# Patient Record
Sex: Female | Born: 1965 | Race: White | Hispanic: No | Marital: Married | Smoking: Never smoker
Health system: Southern US, Community
[De-identification: ages and names within clinical notes are randomized; demographics above are authoritative.]

## PROBLEM LIST (undated history)

## (undated) DIAGNOSIS — F4322 Adjustment disorder with anxiety: Secondary | ICD-10-CM

## (undated) DIAGNOSIS — R0602 Shortness of breath: Secondary | ICD-10-CM

## (undated) DIAGNOSIS — E559 Vitamin D deficiency, unspecified: Secondary | ICD-10-CM

## (undated) DIAGNOSIS — K219 Gastro-esophageal reflux disease without esophagitis: Secondary | ICD-10-CM

## (undated) DIAGNOSIS — G4700 Insomnia, unspecified: Secondary | ICD-10-CM

## (undated) DIAGNOSIS — Z87442 Personal history of urinary calculi: Secondary | ICD-10-CM

## (undated) DIAGNOSIS — IMO0002 Reserved for concepts with insufficient information to code with codable children: Secondary | ICD-10-CM

## (undated) DIAGNOSIS — E785 Hyperlipidemia, unspecified: Secondary | ICD-10-CM

## (undated) DIAGNOSIS — M2550 Pain in unspecified joint: Secondary | ICD-10-CM

## (undated) DIAGNOSIS — R600 Localized edema: Secondary | ICD-10-CM

## (undated) DIAGNOSIS — R7303 Prediabetes: Secondary | ICD-10-CM

## (undated) DIAGNOSIS — F419 Anxiety disorder, unspecified: Secondary | ICD-10-CM

## (undated) DIAGNOSIS — E538 Deficiency of other specified B group vitamins: Secondary | ICD-10-CM

## (undated) DIAGNOSIS — I7121 Aneurysm of the ascending aorta, without rupture: Secondary | ICD-10-CM

## (undated) DIAGNOSIS — K5900 Constipation, unspecified: Secondary | ICD-10-CM

## (undated) DIAGNOSIS — T505X5A Adverse effect of appetite depressants, initial encounter: Secondary | ICD-10-CM

## (undated) DIAGNOSIS — K635 Polyp of colon: Secondary | ICD-10-CM

## (undated) DIAGNOSIS — M549 Dorsalgia, unspecified: Secondary | ICD-10-CM

## (undated) DIAGNOSIS — R61 Generalized hyperhidrosis: Secondary | ICD-10-CM

## (undated) DIAGNOSIS — R32 Unspecified urinary incontinence: Secondary | ICD-10-CM

## (undated) HISTORY — PX: BREAST EXCISIONAL BIOPSY: SUR124

## (undated) HISTORY — DX: Adjustment disorder with anxiety: F43.22

## (undated) HISTORY — DX: Gastro-esophageal reflux disease without esophagitis: K21.9

## (undated) HISTORY — DX: Unspecified urinary incontinence: R32

## (undated) HISTORY — DX: Constipation, unspecified: K59.00

## (undated) HISTORY — DX: Pain in unspecified joint: M25.50

## (undated) HISTORY — DX: Polyp of colon: K63.5

## (undated) HISTORY — DX: Dorsalgia, unspecified: M54.9

## (undated) HISTORY — DX: Shortness of breath: R06.02

## (undated) HISTORY — DX: Anxiety disorder, unspecified: F41.9

## (undated) HISTORY — DX: Prediabetes: R73.03

## (undated) HISTORY — DX: Deficiency of other specified B group vitamins: E53.8

## (undated) HISTORY — DX: Localized edema: R60.0

## (undated) HISTORY — DX: Insomnia, unspecified: G47.00

## (undated) HISTORY — DX: Generalized hyperhidrosis: R61

## (undated) HISTORY — DX: Reserved for concepts with insufficient information to code with codable children: IMO0002

## (undated) HISTORY — PX: TONSILLECTOMY: SUR1361

## (undated) HISTORY — DX: Vitamin D deficiency, unspecified: E55.9

## (undated) HISTORY — DX: Hyperlipidemia, unspecified: E78.5

---
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
## 1991-11-20 HISTORY — PX: OTHER SURGICAL HISTORY: SHX169

## 1998-04-06 ENCOUNTER — Other Ambulatory Visit: Admission: RE | Admit: 1998-04-06 | Discharge: 1998-04-06 | Payer: Self-pay | Admitting: Dermatology

## 1998-10-24 ENCOUNTER — Ambulatory Visit (HOSPITAL_COMMUNITY): Admission: RE | Admit: 1998-10-24 | Discharge: 1998-10-24 | Payer: Self-pay | Admitting: *Deleted

## 2000-09-19 ENCOUNTER — Other Ambulatory Visit: Admission: RE | Admit: 2000-09-19 | Discharge: 2000-09-19 | Payer: Self-pay | Admitting: Obstetrics and Gynecology

## 2002-02-03 ENCOUNTER — Other Ambulatory Visit: Admission: RE | Admit: 2002-02-03 | Discharge: 2002-02-03 | Payer: Self-pay | Admitting: Obstetrics and Gynecology

## 2002-02-10 ENCOUNTER — Observation Stay (HOSPITAL_COMMUNITY): Admission: RE | Admit: 2002-02-10 | Discharge: 2002-02-11 | Payer: Self-pay | Admitting: Obstetrics and Gynecology

## 2002-02-10 HISTORY — PX: OTHER SURGICAL HISTORY: SHX169

## 2003-10-11 ENCOUNTER — Ambulatory Visit (HOSPITAL_COMMUNITY): Admission: RE | Admit: 2003-10-11 | Discharge: 2003-10-11 | Payer: Self-pay | Admitting: Cardiology

## 2004-04-05 ENCOUNTER — Other Ambulatory Visit: Admission: RE | Admit: 2004-04-05 | Discharge: 2004-04-05 | Payer: Self-pay | Admitting: Internal Medicine

## 2004-05-11 ENCOUNTER — Encounter: Admission: RE | Admit: 2004-05-11 | Discharge: 2004-05-11 | Payer: Self-pay | Admitting: Internal Medicine

## 2004-09-07 ENCOUNTER — Encounter: Admission: RE | Admit: 2004-09-07 | Discharge: 2004-09-07 | Payer: Self-pay | Admitting: Internal Medicine

## 2004-10-04 ENCOUNTER — Encounter: Admission: RE | Admit: 2004-10-04 | Discharge: 2004-10-05 | Payer: Self-pay | Admitting: Internal Medicine

## 2005-05-30 ENCOUNTER — Encounter: Admission: RE | Admit: 2005-05-30 | Discharge: 2005-05-30 | Payer: Self-pay | Admitting: Internal Medicine

## 2006-06-20 ENCOUNTER — Ambulatory Visit: Payer: Self-pay | Admitting: Family Medicine

## 2006-06-20 ENCOUNTER — Other Ambulatory Visit: Admission: RE | Admit: 2006-06-20 | Discharge: 2006-06-20 | Payer: Self-pay | Admitting: Family Medicine

## 2006-06-21 ENCOUNTER — Ambulatory Visit: Payer: Self-pay | Admitting: Family Medicine

## 2006-06-27 ENCOUNTER — Ambulatory Visit: Payer: Self-pay | Admitting: Family Medicine

## 2006-07-30 ENCOUNTER — Ambulatory Visit: Payer: Self-pay | Admitting: Family Medicine

## 2006-10-01 ENCOUNTER — Ambulatory Visit: Payer: Self-pay | Admitting: Internal Medicine

## 2006-10-01 LAB — CONVERTED CEMR LAB
Sed Rate: 40 mm/hr — ABNORMAL HIGH (ref 0–25)
Uric Acid, Serum: 4.6 mg/dL (ref 2.4–7.0)

## 2006-10-02 ENCOUNTER — Encounter: Admission: RE | Admit: 2006-10-02 | Discharge: 2006-10-02 | Payer: Self-pay | Admitting: Internal Medicine

## 2006-10-17 ENCOUNTER — Ambulatory Visit: Payer: Self-pay | Admitting: Internal Medicine

## 2006-10-22 ENCOUNTER — Ambulatory Visit: Payer: Self-pay | Admitting: Internal Medicine

## 2006-10-22 LAB — CONVERTED CEMR LAB
Free T4: 1 ng/dL (ref 0.9–1.8)
TSH: 2 microintl units/mL (ref 0.35–5.50)

## 2006-11-01 ENCOUNTER — Ambulatory Visit: Payer: Self-pay | Admitting: Internal Medicine

## 2006-12-27 ENCOUNTER — Ambulatory Visit: Payer: Self-pay | Admitting: Family Medicine

## 2007-01-08 ENCOUNTER — Ambulatory Visit: Payer: Self-pay | Admitting: Family Medicine

## 2007-01-08 LAB — CONVERTED CEMR LAB
Basophils Absolute: 0 10*3/uL (ref 0.0–0.1)
Basophils Relative: 0.8 % (ref 0.0–1.0)
Eosinophils Absolute: 0.1 10*3/uL (ref 0.0–0.6)
Eosinophils Relative: 4.5 % (ref 0.0–5.0)
HCT: 37.6 % (ref 36.0–46.0)
Hemoglobin: 13 g/dL (ref 12.0–15.0)
Lymphocytes Relative: 17.6 % (ref 12.0–46.0)
MCHC: 34.4 g/dL (ref 30.0–36.0)
MCV: 92.8 fL (ref 78.0–100.0)
Monocytes Absolute: 0.5 10*3/uL (ref 0.2–0.7)
Monocytes Relative: 15.2 % — ABNORMAL HIGH (ref 3.0–11.0)
Neutro Abs: 2 10*3/uL (ref 1.4–7.7)
Neutrophils Relative %: 61.9 % (ref 43.0–77.0)
Platelets: 130 10*3/uL — ABNORMAL LOW (ref 150–400)
RBC: 4.06 M/uL (ref 3.87–5.11)
RDW: 12.2 % (ref 11.5–14.6)
WBC: 3.1 10*3/uL — ABNORMAL LOW (ref 4.5–10.5)

## 2007-01-22 ENCOUNTER — Ambulatory Visit: Payer: Self-pay | Admitting: Internal Medicine

## 2007-01-22 LAB — CONVERTED CEMR LAB
ALT: 23 units/L (ref 0–40)
AST: 20 units/L (ref 0–37)
Albumin: 3.2 g/dL — ABNORMAL LOW (ref 3.5–5.2)
Alkaline Phosphatase: 58 units/L (ref 39–117)
Basophils Absolute: 0 10*3/uL (ref 0.0–0.1)
Basophils Relative: 0.1 % (ref 0.0–1.0)
Bilirubin, Direct: 0.1 mg/dL (ref 0.0–0.3)
CO2: 29 meq/L (ref 19–32)
Chloride: 107 meq/L (ref 96–112)
Cholesterol: 198 mg/dL (ref 0–200)
Eosinophils Absolute: 0.2 10*3/uL (ref 0.0–0.6)
Eosinophils Relative: 4.3 % (ref 0.0–5.0)
Free T4: 0.8 ng/dL (ref 0.6–1.6)
HCT: 38.4 % (ref 36.0–46.0)
HDL: 45.4 mg/dL (ref >39.0)
Hemoglobin: 13.2 g/dL (ref 12.0–15.0)
LDL Cholesterol: 134 mg/dL — ABNORMAL HIGH (ref 0–99)
Lymphocytes Relative: 35.7 % (ref 12.0–46.0)
MCHC: 34.3 g/dL (ref 30.0–36.0)
MCV: 91.4 fL (ref 78.0–100.0)
Monocytes Absolute: 0.4 10*3/uL (ref 0.2–0.7)
Monocytes Relative: 8.8 % (ref 3.0–11.0)
Neutro Abs: 2.5 10*3/uL (ref 1.4–7.7)
Neutrophils Relative %: 51.1 % (ref 43.0–77.0)
Platelets: 162 10*3/uL (ref 150–400)
Potassium: 4.4 meq/L (ref 3.5–5.1)
RBC: 4.2 M/uL (ref 3.87–5.11)
RDW: 12 % (ref 11.5–14.6)
Sodium: 141 meq/L (ref 135–145)
TSH: 2.5 microintl units/mL (ref 0.35–5.50)
Total Bilirubin: 0.7 mg/dL (ref 0.3–1.2)
Total CHOL/HDL Ratio: 4.4
Total Protein: 6.6 g/dL (ref 6.0–8.3)
Triglycerides: 92 mg/dL (ref 0–149)
VLDL: 18 mg/dL (ref 0–40)
WBC: 4.8 10*3/uL (ref 4.5–10.5)

## 2007-05-20 ENCOUNTER — Telehealth (INDEPENDENT_AMBULATORY_CARE_PROVIDER_SITE_OTHER): Payer: Self-pay | Admitting: *Deleted

## 2007-06-06 ENCOUNTER — Ambulatory Visit: Payer: Self-pay | Admitting: Family Medicine

## 2007-06-06 DIAGNOSIS — E669 Obesity, unspecified: Secondary | ICD-10-CM | POA: Insufficient documentation

## 2007-07-10 ENCOUNTER — Ambulatory Visit: Payer: Self-pay | Admitting: Family Medicine

## 2007-07-11 ENCOUNTER — Telehealth (INDEPENDENT_AMBULATORY_CARE_PROVIDER_SITE_OTHER): Payer: Self-pay | Admitting: *Deleted

## 2007-08-05 ENCOUNTER — Telehealth (INDEPENDENT_AMBULATORY_CARE_PROVIDER_SITE_OTHER): Payer: Self-pay | Admitting: *Deleted

## 2007-09-17 ENCOUNTER — Telehealth: Payer: Self-pay | Admitting: Internal Medicine

## 2007-09-23 ENCOUNTER — Encounter: Admission: RE | Admit: 2007-09-23 | Discharge: 2007-09-23 | Payer: Self-pay | Admitting: Family Medicine

## 2007-12-02 ENCOUNTER — Ambulatory Visit: Payer: Self-pay | Admitting: Internal Medicine

## 2007-12-02 DIAGNOSIS — IMO0001 Reserved for inherently not codable concepts without codable children: Secondary | ICD-10-CM | POA: Insufficient documentation

## 2007-12-06 LAB — CONVERTED CEMR LAB
BUN: 12 mg/dL (ref 6–23)
Creatinine, Ser: 0.7 mg/dL (ref 0.4–1.2)
Potassium: 4.6 meq/L (ref 3.5–5.1)
Total CK: 49 units/L (ref 7–177)

## 2007-12-08 ENCOUNTER — Encounter (INDEPENDENT_AMBULATORY_CARE_PROVIDER_SITE_OTHER): Payer: Self-pay | Admitting: *Deleted

## 2007-12-18 ENCOUNTER — Telehealth (INDEPENDENT_AMBULATORY_CARE_PROVIDER_SITE_OTHER): Payer: Self-pay | Admitting: *Deleted

## 2008-02-19 ENCOUNTER — Telehealth (INDEPENDENT_AMBULATORY_CARE_PROVIDER_SITE_OTHER): Payer: Self-pay | Admitting: *Deleted

## 2008-05-25 ENCOUNTER — Ambulatory Visit: Payer: Self-pay | Admitting: Internal Medicine

## 2008-05-25 DIAGNOSIS — M940 Chondrocostal junction syndrome [Tietze]: Secondary | ICD-10-CM | POA: Insufficient documentation

## 2008-06-01 ENCOUNTER — Other Ambulatory Visit: Admission: RE | Admit: 2008-06-01 | Discharge: 2008-06-01 | Payer: Self-pay | Admitting: Family Medicine

## 2008-06-01 ENCOUNTER — Ambulatory Visit: Payer: Self-pay | Admitting: Family Medicine

## 2008-06-01 ENCOUNTER — Encounter: Payer: Self-pay | Admitting: Family Medicine

## 2008-06-03 ENCOUNTER — Encounter (INDEPENDENT_AMBULATORY_CARE_PROVIDER_SITE_OTHER): Payer: Self-pay | Admitting: *Deleted

## 2008-06-14 ENCOUNTER — Encounter (INDEPENDENT_AMBULATORY_CARE_PROVIDER_SITE_OTHER): Payer: Self-pay | Admitting: *Deleted

## 2008-07-28 ENCOUNTER — Ambulatory Visit: Payer: Self-pay | Admitting: Family Medicine

## 2008-07-28 DIAGNOSIS — R079 Chest pain, unspecified: Secondary | ICD-10-CM | POA: Insufficient documentation

## 2008-08-11 ENCOUNTER — Ambulatory Visit: Payer: Self-pay

## 2008-08-11 ENCOUNTER — Encounter: Payer: Self-pay | Admitting: Family Medicine

## 2008-08-31 ENCOUNTER — Telehealth (INDEPENDENT_AMBULATORY_CARE_PROVIDER_SITE_OTHER): Payer: Self-pay | Admitting: *Deleted

## 2008-09-02 ENCOUNTER — Telehealth (INDEPENDENT_AMBULATORY_CARE_PROVIDER_SITE_OTHER): Payer: Self-pay | Admitting: *Deleted

## 2008-09-13 ENCOUNTER — Telehealth (INDEPENDENT_AMBULATORY_CARE_PROVIDER_SITE_OTHER): Payer: Self-pay | Admitting: *Deleted

## 2008-09-14 ENCOUNTER — Encounter (INDEPENDENT_AMBULATORY_CARE_PROVIDER_SITE_OTHER): Payer: Self-pay | Admitting: *Deleted

## 2008-12-28 ENCOUNTER — Ambulatory Visit: Payer: Self-pay | Admitting: Family Medicine

## 2008-12-29 ENCOUNTER — Telehealth (INDEPENDENT_AMBULATORY_CARE_PROVIDER_SITE_OTHER): Payer: Self-pay | Admitting: *Deleted

## 2008-12-30 ENCOUNTER — Encounter (INDEPENDENT_AMBULATORY_CARE_PROVIDER_SITE_OTHER): Payer: Self-pay | Admitting: *Deleted

## 2009-02-10 ENCOUNTER — Ambulatory Visit: Payer: Self-pay | Admitting: Internal Medicine

## 2009-02-10 DIAGNOSIS — K219 Gastro-esophageal reflux disease without esophagitis: Secondary | ICD-10-CM | POA: Insufficient documentation

## 2009-02-10 LAB — CONVERTED CEMR LAB
Bilirubin Urine: NEGATIVE
Blood in Urine, dipstick: NEGATIVE
Glucose, Urine, Semiquant: NEGATIVE
Ketones, urine, test strip: NEGATIVE
Nitrite: POSITIVE
Protein, U semiquant: NEGATIVE
Specific Gravity, Urine: 1.01
Urobilinogen, UA: 0.2
WBC Urine, dipstick: NEGATIVE
pH: 6

## 2009-02-14 ENCOUNTER — Encounter (INDEPENDENT_AMBULATORY_CARE_PROVIDER_SITE_OTHER): Payer: Self-pay | Admitting: *Deleted

## 2009-03-17 ENCOUNTER — Ambulatory Visit: Payer: Self-pay | Admitting: Family Medicine

## 2009-03-17 LAB — CONVERTED CEMR LAB
ALT: 21 units/L (ref 0–35)
AST: 19 units/L (ref 0–37)
Albumin: 3.7 g/dL (ref 3.5–5.2)
Alkaline Phosphatase: 61 units/L (ref 39–117)
Bilirubin, Direct: 0 mg/dL (ref 0.0–0.3)
Cholesterol: 202 mg/dL — ABNORMAL HIGH (ref 0–200)
Direct LDL: 129.7 mg/dL
HDL: 38.3 mg/dL — ABNORMAL LOW (ref >39.00)
Total Bilirubin: 0.7 mg/dL (ref 0.3–1.2)
Total CHOL/HDL Ratio: 5
Total Protein: 6.9 g/dL (ref 6.0–8.3)
Triglycerides: 134 mg/dL (ref 0.0–149.0)
VLDL: 26.8 mg/dL (ref 0.0–40.0)

## 2009-03-18 ENCOUNTER — Encounter (INDEPENDENT_AMBULATORY_CARE_PROVIDER_SITE_OTHER): Payer: Self-pay | Admitting: *Deleted

## 2009-04-06 ENCOUNTER — Ambulatory Visit: Payer: Self-pay | Admitting: Family Medicine

## 2009-04-06 DIAGNOSIS — R252 Cramp and spasm: Secondary | ICD-10-CM | POA: Insufficient documentation

## 2009-04-06 LAB — CONVERTED CEMR LAB
BUN: 15 mg/dL (ref 6–23)
CO2: 29 meq/L (ref 19–32)
Calcium: 9.2 mg/dL (ref 8.4–10.5)
Chloride: 110 meq/L (ref 96–112)
Creatinine, Ser: 0.6 mg/dL (ref 0.4–1.2)
GFR calc non Af Amer: 114.98 mL/min (ref >60)
Glucose, Bld: 69 mg/dL — ABNORMAL LOW (ref 70–99)
Magnesium: 2.2 mg/dL (ref 1.5–2.5)
Potassium: 4 meq/L (ref 3.5–5.1)
Sodium: 142 meq/L (ref 135–145)
TSH: 2.9 microintl units/mL (ref 0.35–5.50)

## 2009-04-07 ENCOUNTER — Encounter (INDEPENDENT_AMBULATORY_CARE_PROVIDER_SITE_OTHER): Payer: Self-pay | Admitting: *Deleted

## 2009-04-07 ENCOUNTER — Encounter: Payer: Self-pay | Admitting: Family Medicine

## 2009-04-19 ENCOUNTER — Encounter (INDEPENDENT_AMBULATORY_CARE_PROVIDER_SITE_OTHER): Payer: Self-pay | Admitting: *Deleted

## 2009-04-19 LAB — CONVERTED CEMR LAB: Vit D, 25-Hydroxy: 25 ng/mL — ABNORMAL LOW (ref 30–89)

## 2009-04-21 ENCOUNTER — Telehealth (INDEPENDENT_AMBULATORY_CARE_PROVIDER_SITE_OTHER): Payer: Self-pay | Admitting: *Deleted

## 2009-05-03 ENCOUNTER — Ambulatory Visit (HOSPITAL_COMMUNITY): Admission: RE | Admit: 2009-05-03 | Discharge: 2009-05-04 | Payer: Self-pay | Admitting: Obstetrics and Gynecology

## 2009-05-03 HISTORY — PX: ANTERIOR AND POSTERIOR REPAIR: SHX1172

## 2009-05-17 ENCOUNTER — Encounter: Payer: Self-pay | Admitting: Internal Medicine

## 2009-06-15 ENCOUNTER — Telehealth (INDEPENDENT_AMBULATORY_CARE_PROVIDER_SITE_OTHER): Payer: Self-pay | Admitting: *Deleted

## 2009-06-21 ENCOUNTER — Telehealth (INDEPENDENT_AMBULATORY_CARE_PROVIDER_SITE_OTHER): Payer: Self-pay | Admitting: *Deleted

## 2009-06-23 ENCOUNTER — Ambulatory Visit: Payer: Self-pay | Admitting: Family Medicine

## 2009-06-23 ENCOUNTER — Ambulatory Visit (HOSPITAL_BASED_OUTPATIENT_CLINIC_OR_DEPARTMENT_OTHER): Admission: RE | Admit: 2009-06-23 | Discharge: 2009-06-23 | Payer: Self-pay | Admitting: Family Medicine

## 2009-06-23 ENCOUNTER — Ambulatory Visit: Payer: Self-pay | Admitting: Interventional Radiology

## 2009-06-23 DIAGNOSIS — M25519 Pain in unspecified shoulder: Secondary | ICD-10-CM | POA: Insufficient documentation

## 2009-06-23 DIAGNOSIS — T7411XA Adult physical abuse, confirmed, initial encounter: Secondary | ICD-10-CM | POA: Insufficient documentation

## 2009-07-19 ENCOUNTER — Telehealth: Payer: Self-pay | Admitting: Internal Medicine

## 2009-07-22 ENCOUNTER — Telehealth: Payer: Self-pay | Admitting: Internal Medicine

## 2009-08-19 ENCOUNTER — Ambulatory Visit: Payer: Self-pay | Admitting: Family Medicine

## 2009-08-25 ENCOUNTER — Telehealth: Payer: Self-pay | Admitting: Family Medicine

## 2009-08-25 LAB — CONVERTED CEMR LAB: Vit D, 25-Hydroxy: 27 ng/mL — ABNORMAL LOW (ref 30–89)

## 2009-08-29 ENCOUNTER — Encounter: Payer: Self-pay | Admitting: Family Medicine

## 2009-08-31 ENCOUNTER — Telehealth (INDEPENDENT_AMBULATORY_CARE_PROVIDER_SITE_OTHER): Payer: Self-pay | Admitting: *Deleted

## 2009-09-12 ENCOUNTER — Ambulatory Visit: Payer: Self-pay | Admitting: Family Medicine

## 2009-10-17 ENCOUNTER — Telehealth: Payer: Self-pay | Admitting: Family Medicine

## 2009-10-26 ENCOUNTER — Telehealth: Payer: Self-pay | Admitting: Internal Medicine

## 2009-11-10 ENCOUNTER — Telehealth: Payer: Self-pay | Admitting: Internal Medicine

## 2009-11-15 ENCOUNTER — Ambulatory Visit: Payer: Self-pay | Admitting: Internal Medicine

## 2009-11-15 ENCOUNTER — Encounter: Payer: Self-pay | Admitting: Internal Medicine

## 2009-11-15 LAB — CONVERTED CEMR LAB
Bilirubin Urine: NEGATIVE
Glucose, Urine, Semiquant: NEGATIVE
Ketones, urine, test strip: NEGATIVE
Nitrite: NEGATIVE
Protein, U semiquant: NEGATIVE
Specific Gravity, Urine: 1.02
Urobilinogen, UA: 0.2
pH: 6.5

## 2009-11-16 LAB — CONVERTED CEMR LAB
Casts: NONE SEEN /lpf
Crystals: NONE SEEN
Squamous Epithelial / LPF: NONE SEEN /lpf

## 2009-11-21 ENCOUNTER — Encounter (INDEPENDENT_AMBULATORY_CARE_PROVIDER_SITE_OTHER): Payer: Self-pay | Admitting: *Deleted

## 2009-11-30 ENCOUNTER — Ambulatory Visit: Payer: Self-pay | Admitting: Internal Medicine

## 2009-11-30 LAB — CONVERTED CEMR LAB
Bilirubin Urine: NEGATIVE
Blood in Urine, dipstick: NEGATIVE
Glucose, Urine, Semiquant: NEGATIVE
Ketones, urine, test strip: NEGATIVE
Nitrite: NEGATIVE
Protein, U semiquant: NEGATIVE
Specific Gravity, Urine: 1.015
Urobilinogen, UA: 0.2
pH: 5

## 2009-12-01 ENCOUNTER — Encounter: Payer: Self-pay | Admitting: Internal Medicine

## 2009-12-01 LAB — CONVERTED CEMR LAB
Casts: NONE SEEN /lpf
Crystals: NONE SEEN
RBC / HPF: NONE SEEN (ref <3)

## 2009-12-08 ENCOUNTER — Ambulatory Visit (HOSPITAL_COMMUNITY): Admission: RE | Admit: 2009-12-08 | Discharge: 2009-12-08 | Payer: Self-pay | Admitting: Internal Medicine

## 2010-01-18 ENCOUNTER — Encounter: Admission: RE | Admit: 2010-01-18 | Discharge: 2010-01-18 | Payer: Self-pay | Admitting: Obstetrics and Gynecology

## 2010-02-27 ENCOUNTER — Telehealth (INDEPENDENT_AMBULATORY_CARE_PROVIDER_SITE_OTHER): Payer: Self-pay | Admitting: *Deleted

## 2010-03-25 ENCOUNTER — Telehealth: Payer: Self-pay | Admitting: Family Medicine

## 2010-03-27 ENCOUNTER — Telehealth: Payer: Self-pay | Admitting: Family Medicine

## 2010-03-31 ENCOUNTER — Encounter (INDEPENDENT_AMBULATORY_CARE_PROVIDER_SITE_OTHER): Payer: Self-pay | Admitting: *Deleted

## 2010-04-11 ENCOUNTER — Ambulatory Visit: Payer: Self-pay | Admitting: Family Medicine

## 2010-04-11 DIAGNOSIS — N390 Urinary tract infection, site not specified: Secondary | ICD-10-CM | POA: Insufficient documentation

## 2010-04-12 ENCOUNTER — Encounter: Payer: Self-pay | Admitting: Family Medicine

## 2010-04-13 ENCOUNTER — Encounter (INDEPENDENT_AMBULATORY_CARE_PROVIDER_SITE_OTHER): Payer: Self-pay | Admitting: *Deleted

## 2010-04-18 ENCOUNTER — Ambulatory Visit: Payer: Self-pay | Admitting: Family Medicine

## 2010-04-18 DIAGNOSIS — E538 Deficiency of other specified B group vitamins: Secondary | ICD-10-CM | POA: Insufficient documentation

## 2010-04-25 ENCOUNTER — Ambulatory Visit: Payer: Self-pay | Admitting: Family Medicine

## 2010-05-02 ENCOUNTER — Ambulatory Visit: Payer: Self-pay | Admitting: Family Medicine

## 2010-05-09 ENCOUNTER — Ambulatory Visit: Payer: Self-pay | Admitting: Family Medicine

## 2010-05-19 ENCOUNTER — Telehealth: Payer: Self-pay | Admitting: Internal Medicine

## 2010-08-01 ENCOUNTER — Ambulatory Visit: Payer: Self-pay | Admitting: Cardiovascular Disease

## 2010-08-01 ENCOUNTER — Ambulatory Visit: Payer: Self-pay | Admitting: Internal Medicine

## 2010-08-01 LAB — CONVERTED CEMR LAB
ALT: 14 units/L (ref 0–35)
AST: 16 units/L (ref 0–37)
Albumin: 3.6 g/dL (ref 3.5–5.2)
Alkaline Phosphatase: 59 units/L (ref 39–117)
Amylase: 31 units/L (ref 27–131)
Basophils Absolute: 0 10*3/uL (ref 0.0–0.1)
Basophils Relative: 0.2 % (ref 0.0–3.0)
Bilirubin Urine: NEGATIVE
Bilirubin, Direct: 0.1 mg/dL (ref 0.0–0.3)
Blood Glucose, Fingerstick: 93
Blood in Urine, dipstick: NEGATIVE
Eosinophils Absolute: 0.1 10*3/uL (ref 0.0–0.7)
Eosinophils Relative: 2.4 % (ref 0.0–5.0)
Glucose, Urine, Semiquant: NEGATIVE
HCT: 39.2 % (ref 36.0–46.0)
Hemoglobin: 13.4 g/dL (ref 12.0–15.0)
Ketones, urine, test strip: NEGATIVE
Lipase: 36 units/L (ref 11.0–59.0)
Lymphocytes Relative: 31.5 % (ref 12.0–46.0)
Lymphs Abs: 1.6 10*3/uL (ref 0.7–4.0)
MCHC: 34.1 g/dL (ref 30.0–36.0)
MCV: 92.8 fL (ref 78.0–100.0)
Monocytes Absolute: 0.4 10*3/uL (ref 0.1–1.0)
Monocytes Relative: 7.9 % (ref 3.0–12.0)
Neutro Abs: 3 10*3/uL (ref 1.4–7.7)
Neutrophils Relative %: 58 % (ref 43.0–77.0)
Nitrite: NEGATIVE
Platelets: 166 10*3/uL (ref 150.0–400.0)
Protein, U semiquant: NEGATIVE
RBC: 4.22 M/uL (ref 3.87–5.11)
RDW: 12.6 % (ref 11.5–14.6)
Specific Gravity, Urine: 1.01
Total Bilirubin: 0.3 mg/dL (ref 0.3–1.2)
Total Protein: 6.4 g/dL (ref 6.0–8.3)
Urobilinogen, UA: NEGATIVE
WBC Urine, dipstick: NEGATIVE
WBC: 5.1 10*3/uL (ref 4.5–10.5)
pH: 6

## 2010-08-02 ENCOUNTER — Encounter: Payer: Self-pay | Admitting: Internal Medicine

## 2010-08-02 LAB — CONVERTED CEMR LAB
Casts: NONE SEEN /lpf
Crystals: NONE SEEN

## 2010-08-03 ENCOUNTER — Encounter: Payer: Self-pay | Admitting: Family Medicine

## 2010-08-17 ENCOUNTER — Encounter: Payer: Self-pay | Admitting: Internal Medicine

## 2010-08-22 ENCOUNTER — Telehealth (INDEPENDENT_AMBULATORY_CARE_PROVIDER_SITE_OTHER): Payer: Self-pay | Admitting: *Deleted

## 2010-08-23 ENCOUNTER — Ambulatory Visit: Payer: Self-pay | Admitting: Internal Medicine

## 2010-08-23 DIAGNOSIS — R51 Headache: Secondary | ICD-10-CM | POA: Insufficient documentation

## 2010-08-23 DIAGNOSIS — R519 Headache, unspecified: Secondary | ICD-10-CM | POA: Insufficient documentation

## 2010-08-28 ENCOUNTER — Telehealth: Payer: Self-pay | Admitting: Internal Medicine

## 2010-08-29 ENCOUNTER — Ambulatory Visit: Payer: Self-pay | Admitting: Internal Medicine

## 2010-08-29 ENCOUNTER — Telehealth: Payer: Self-pay | Admitting: Internal Medicine

## 2010-09-06 ENCOUNTER — Telehealth: Payer: Self-pay | Admitting: Internal Medicine

## 2010-09-14 ENCOUNTER — Ambulatory Visit: Payer: Self-pay | Admitting: Internal Medicine

## 2010-09-14 DIAGNOSIS — R5381 Other malaise: Secondary | ICD-10-CM | POA: Insufficient documentation

## 2010-09-14 DIAGNOSIS — R5383 Other fatigue: Secondary | ICD-10-CM

## 2010-09-14 LAB — CONVERTED CEMR LAB
Casts: NONE SEEN /lpf
Crystals: NONE SEEN
Folate: 9.3 ng/mL
Squamous Epithelial / LPF: NONE SEEN /lpf
Vitamin B-12: 311 pg/mL (ref 211–911)

## 2010-09-15 ENCOUNTER — Encounter: Payer: Self-pay | Admitting: Internal Medicine

## 2010-09-20 ENCOUNTER — Telehealth: Payer: Self-pay | Admitting: Internal Medicine

## 2010-09-21 ENCOUNTER — Telehealth (INDEPENDENT_AMBULATORY_CARE_PROVIDER_SITE_OTHER): Payer: Self-pay | Admitting: *Deleted

## 2010-10-23 ENCOUNTER — Telehealth (INDEPENDENT_AMBULATORY_CARE_PROVIDER_SITE_OTHER): Payer: Self-pay | Admitting: *Deleted

## 2010-10-26 ENCOUNTER — Ambulatory Visit
Admission: RE | Admit: 2010-10-26 | Discharge: 2010-10-26 | Payer: Self-pay | Source: Home / Self Care | Attending: Urology | Admitting: Urology

## 2010-10-26 HISTORY — PX: OTHER SURGICAL HISTORY: SHX169

## 2010-11-30 ENCOUNTER — Telehealth: Payer: Self-pay | Admitting: Family Medicine

## 2010-12-09 ENCOUNTER — Encounter: Payer: Self-pay | Admitting: Internal Medicine

## 2010-12-10 ENCOUNTER — Encounter: Payer: Self-pay | Admitting: Internal Medicine

## 2010-12-10 ENCOUNTER — Encounter: Payer: Self-pay | Admitting: Obstetrics and Gynecology

## 2010-12-10 ENCOUNTER — Encounter: Payer: Self-pay | Admitting: Family Medicine

## 2010-12-17 LAB — CONVERTED CEMR LAB
ALT: 16 units/L (ref 0–35)
ALT: 19 units/L (ref 0–35)
AST: 15 units/L (ref 0–37)
AST: 18 units/L (ref 0–37)
Albumin: 3.5 g/dL (ref 3.5–5.2)
Albumin: 3.7 g/dL (ref 3.5–5.2)
Alkaline Phosphatase: 55 units/L (ref 39–117)
Alkaline Phosphatase: 56 units/L (ref 39–117)
BUN: 10 mg/dL (ref 6–23)
BUN: 12 mg/dL (ref 6–23)
Basophils Absolute: 0 10*3/uL (ref 0.0–0.1)
Basophils Absolute: 0 10*3/uL (ref 0.0–0.1)
Basophils Relative: 0.2 % (ref 0.0–1.0)
Basophils Relative: 0.5 % (ref 0.0–3.0)
Bilirubin, Direct: 0.1 mg/dL (ref 0.0–0.3)
Bilirubin, Direct: 0.1 mg/dL (ref 0.0–0.3)
Blood in Urine, dipstick: NEGATIVE
CO2: 27 meq/L (ref 19–32)
CO2: 28 meq/L (ref 19–32)
Calcium: 9 mg/dL (ref 8.4–10.5)
Calcium: 9.1 mg/dL (ref 8.4–10.5)
Chloride: 105 meq/L (ref 96–112)
Chloride: 106 meq/L (ref 96–112)
Cholesterol: 195 mg/dL (ref 0–200)
Cholesterol: 203 mg/dL (ref 0–200)
Creatinine, Ser: 0.5 mg/dL (ref 0.4–1.2)
Creatinine, Ser: 0.5 mg/dL (ref 0.4–1.2)
Direct LDL: 135.3 mg/dL
Eosinophils Absolute: 0.1 10*3/uL (ref 0.0–0.7)
Eosinophils Absolute: 0.2 10*3/uL (ref 0.0–0.7)
Eosinophils Relative: 1.9 % (ref 0.0–5.0)
Eosinophils Relative: 4.6 % (ref 0.0–5.0)
Folate: 11.4 ng/mL
GFR calc Af Amer: 173 mL/min
GFR calc non Af Amer: 143 mL/min
GFR calc non Af Amer: 155.53 mL/min (ref >60)
Glucose, Bld: 84 mg/dL (ref 70–99)
Glucose, Bld: 88 mg/dL (ref 70–99)
Glucose, Urine, Semiquant: NEGATIVE
HCT: 37.7 % (ref 36.0–46.0)
HCT: 38.4 % (ref 36.0–46.0)
HDL: 36.3 mg/dL — ABNORMAL LOW (ref >39.0)
HDL: 43.5 mg/dL (ref >39.00)
Hemoglobin: 13 g/dL (ref 12.0–15.0)
Hemoglobin: 13.4 g/dL (ref 12.0–15.0)
Ketones, urine, test strip: NEGATIVE
LDL Cholesterol: 125 mg/dL — ABNORMAL HIGH (ref 0–99)
Lymphocytes Relative: 29.1 % (ref 12.0–46.0)
Lymphocytes Relative: 31 % (ref 12.0–46.0)
Lymphs Abs: 1.5 10*3/uL (ref 0.7–4.0)
MCHC: 34.5 g/dL (ref 30.0–36.0)
MCHC: 34.9 g/dL (ref 30.0–36.0)
MCV: 91.2 fL (ref 78.0–100.0)
MCV: 92.3 fL (ref 78.0–100.0)
Monocytes Absolute: 0.3 10*3/uL (ref 0.1–1.0)
Monocytes Absolute: 0.4 10*3/uL (ref 0.1–1.0)
Monocytes Relative: 6.6 % (ref 3.0–12.0)
Monocytes Relative: 8.6 % (ref 3.0–12.0)
Neutro Abs: 2.5 10*3/uL (ref 1.4–7.7)
Neutro Abs: 3.1 10*3/uL (ref 1.4–7.7)
Neutrophils Relative %: 58.3 % (ref 43.0–77.0)
Neutrophils Relative %: 59.2 % (ref 43.0–77.0)
Nitrite: NEGATIVE
Platelets: 162 10*3/uL (ref 150–400)
Platelets: 167 10*3/uL (ref 150.0–400.0)
Potassium: 3.9 meq/L (ref 3.5–5.1)
Potassium: 4 meq/L (ref 3.5–5.1)
Protein, U semiquant: NEGATIVE
RBC: 4.08 M/uL (ref 3.87–5.11)
RBC: 4.21 M/uL (ref 3.87–5.11)
RDW: 11.7 % (ref 11.5–14.6)
RDW: 13.4 % (ref 11.5–14.6)
Sodium: 139 meq/L (ref 135–145)
Sodium: 140 meq/L (ref 135–145)
Specific Gravity, Urine: 1.015
TSH: 1.68 microintl units/mL (ref 0.35–5.50)
TSH: 1.96 microintl units/mL (ref 0.35–5.50)
Total Bilirubin: 0.6 mg/dL (ref 0.3–1.2)
Total Bilirubin: 0.8 mg/dL (ref 0.3–1.2)
Total CHOL/HDL Ratio: 4
Total CHOL/HDL Ratio: 5.6
Total Protein: 6.3 g/dL (ref 6.0–8.3)
Total Protein: 6.5 g/dL (ref 6.0–8.3)
Triglycerides: 135 mg/dL (ref 0.0–149.0)
Triglycerides: 148 mg/dL (ref 0–149)
Urobilinogen, UA: 0.2
VLDL: 27 mg/dL (ref 0.0–40.0)
VLDL: 30 mg/dL (ref 0–40)
Vit D, 25-Hydroxy: 25 ng/mL — ABNORMAL LOW (ref 30–89)
Vitamin B-12: 246 pg/mL (ref 211–911)
WBC Urine, dipstick: NEGATIVE
WBC: 4.3 10*3/uL — ABNORMAL LOW (ref 4.5–10.5)
WBC: 5.3 10*3/uL (ref 4.5–10.5)
pH: 7

## 2010-12-21 NOTE — Assessment & Plan Note (Signed)
Summary: cpx/cbs   Vital Signs:  Patient profile:   45 year old female Height:      63 inches Weight:      229.38 pounds Temp:     98.2 degrees F oral Pulse rate:   86 / minute Pulse rhythm:   regular BP sitting:   115 / 83  Vitals Entered By: Army Fossa CMA (May 02, 2010 1:07 PM) CC: Pt here for CPX, no pap. (had last june- had hysterectomy)   History of Present Illness: Pt here for cpe.  No complaints.    Preventive Screening-Counseling & Management  Alcohol-Tobacco     Alcohol drinks/day: <1     Smoking Status: never  Caffeine-Diet-Exercise     Caffeine use/day: 3     Does Patient Exercise: yes     Type of exercise: walking     Exercise (avg: min/session): 30-60     Times/week: 5  Hep-HIV-STD-Contraception     HIV Risk: no     Dental Visit-last 6 months yes     SBE monthly: yes  Safety-Violence-Falls     Seat Belt Use: 100      Sexual History:  currently monogamous.    Current Medications (verified): 1)  Lorazepam 0.5 Mg Tabs (Lorazepam) .Marland Kitchen.. 1 By Mouth Three Times A Day As Needed 2)  Adipex-P 37.5 Mg Tabs (Phentermine Hcl) .Marland Kitchen.. 1 By Mouth Once Daily  Allergies (verified): No Known Drug Allergies  Past History:  Past Medical History: Last updated: 12/02/2007 disorder adjustment with anxiety otitis externa, acute upper respiratory infection obesity, nos  Family History: Last updated: 26-Aug-2008 mother died 83 of MI triple bypass age 58 father cad maternal grandmother dm mother lung cancer both sides arthritis and depression/alcohol abuse Family History of CAD Female 1st degree relative <60 Family History of CAD Female 1st degree relative <50  Social History: Last updated: 06/01/2008 Married Never Smoked Alcohol use-no Drug use-no Regular exercise-no  Risk Factors: Alcohol Use: <1 (05/02/2010) Caffeine Use: 3 (05/02/2010) Exercise: yes (05/02/2010)  Risk Factors: Smoking Status: never (05/02/2010)  Past Surgical History: cyst  removed left breast bladder tack, June 2010 Hysterectomy, no oophorectomy Tonsillectomy bladder strap 2010 ant repair vagina 2010  Family History: Reviewed history from 08-26-08 and no changes required. mother died 30 of MI triple bypass age 40 father cad maternal grandmother dm mother lung cancer both sides arthritis and depression/alcohol abuse Family History of CAD Female 1st degree relative <60 Family History of CAD Female 1st degree relative <50  Social History: Reviewed history from 06/01/2008 and no changes required. Married Never Smoked Alcohol use-no Drug use-no Regular exercise-no Does Patient Exercise:  yes Dental Care w/in 6 mos.:  yes Sexual History:  currently monogamous  Review of Systems      See HPI General:  Denies chills, fatigue, fever, loss of appetite, malaise, sleep disorder, sweats, weakness, and weight loss. Eyes:  Denies blurring, discharge, double vision, eye irritation, eye pain, halos, itching, light sensitivity, red eye, vision loss-1 eye, and vision loss-both eyes; ophtho q2y. ENT:  Denies decreased hearing, difficulty swallowing, ear discharge, earache, hoarseness, nasal congestion, nosebleeds, postnasal drainage, ringing in ears, sinus pressure, and sore throat. CV:  Denies bluish discoloration of lips or nails, chest pain or discomfort, difficulty breathing at night, difficulty breathing while lying down, fainting, fatigue, leg cramps with exertion, lightheadness, near fainting, palpitations, shortness of breath with exertion, swelling of feet, swelling of hands, and weight gain. Resp:  Denies chest discomfort, chest pain with inspiration, cough, coughing  up blood, excessive snoring, hypersomnolence, morning headaches, pleuritic, shortness of breath, sputum productive, and wheezing. GI:  Denies abdominal pain, bloody stools, change in bowel habits, constipation, dark tarry stools, diarrhea, excessive appetite, gas, hemorrhoids, indigestion, loss of  appetite, nausea, vomiting, vomiting blood, and yellowish skin color. GU:  Complains of incontinence; urologist-  [name]. MS:  Denies joint pain, joint redness, joint swelling, loss of strength, low back pain, mid back pain, muscle aches, muscle , cramps, muscle weakness, stiffness, and thoracic pain. Derm:  Denies changes in color of skin, changes in nail beds, dryness, excessive perspiration, flushing, hair loss, insect bite(s), itching, lesion(s), poor wound healing, and rash. Neuro:  Denies brief paralysis, difficulty with concentration, disturbances in coordination, falling down, headaches, inability to speak, memory loss, numbness, poor balance, seizures, sensation of room spinning, tingling, tremors, visual disturbances, and weakness. Psych:  Denies alternate hallucination ( auditory/visual), anxiety, depression, easily angered, easily tearful, irritability, mental problems, panic attacks, sense of great danger, suicidal thoughts/plans, thoughts of violence, unusual visions or sounds, and thoughts /plans of harming others. Endo:  Denies cold intolerance, excessive hunger, excessive thirst, excessive urination, heat intolerance, polyuria, and weight change. Heme:  Denies abnormal bruising, bleeding, enlarge lymph nodes, fevers, pallor, and skin discoloration. Allergy:  Denies hives or rash, itching eyes, persistent infections, seasonal allergies, and sneezing.  Physical Exam  General:  Well-developed,well-nourished,in no acute distress; alert,appropriate and cooperative throughout examinationoverweight-appearing.   Head:  Normocephalic and atraumatic without obvious abnormalities. No apparent alopecia or balding. Eyes:  pupils equal, pupils round, and pupils reactive to light.   Ears:  External ear exam shows no significant lesions or deformities.  Otoscopic examination reveals clear canals, tympanic membranes are intact bilaterally without bulging, retraction, inflammation or discharge. Hearing  is grossly normal bilaterally. Nose:  External nasal examination shows no deformity or inflammation. Nasal mucosa are pink and moist without lesions or exudates. Mouth:  Oral mucosa and oropharynx without lesions or exudates.  Teeth in good repair. Neck:  No deformities, masses, or tenderness noted. Chest Wall:  No deformities, masses, or tenderness noted. Breasts:  No mass, nodules, thickening, tenderness, bulging, retraction, inflamation, nipple discharge or skin changes noted.   Lungs:  Normal respiratory effort, chest expands symmetrically. Lungs are clear to auscultation, no crackles or wheezes. Heart:  normal rate and no murmur.   Abdomen:  Bowel sounds positive,abdomen soft and non-tender without masses, organomegaly or hernias noted. Rectal:  deferred Genitalia:  deferred Msk:  normal ROM, no joint tenderness, no joint swelling, no joint warmth, no redness over joints, no joint deformities, no joint instability, and no crepitation.   Pulses:  R posterior tibial normal, R dorsalis pedis normal, R carotid normal, L posterior tibial normal, L dorsalis pedis normal, and L carotid normal.   Extremities:  No clubbing, cyanosis, edema, or deformity noted with normal full range of motion of all joints.   Neurologic:  No cranial nerve deficits noted. Station and gait are normal. Plantar reflexes are down-going bilaterally. DTRs are symmetrical throughout. Sensory, motor and coordinative functions appear intact. Skin:  Intact without suspicious lesions or rashes Cervical Nodes:  No lymphadenopathy noted Axillary Nodes:  No palpable lymphadenopathy Psych:  Cognition and judgment appear intact. Alert and cooperative with normal attention span and concentration. No apparent delusions, illusions, hallucinations   Impression & Recommendations:  Problem # 1:  PREVENTIVE HEALTH CARE (ICD-V70.0)  labs reviewed with pt ghm utd   Orders: EKG w/ Interpretation (93000)  Problem # 2:  B12 DEFICIENCY  (  ICD-266.2)  Orders: Admin of Therapeutic Inj  intramuscular or subcutaneous (52841) Vit B12 1000 mcg (J3420)  Problem # 3:  MORBID OBESITY (ICD-278.01)  Ht: 63 (05/02/2010)   Wt: 229.38 (05/02/2010)   BMI: 43.78 (04/11/2010)  Complete Medication List: 1)  Lorazepam 0.5 Mg Tabs (Lorazepam) .Marland Kitchen.. 1 by mouth three times a day as needed 2)  Adipex-p 37.5 Mg Tabs (Phentermine hcl) .Marland Kitchen.. 1 by mouth once daily  Other Orders: Tdap => 4yrs IM (32440) Admin 1st Vaccine (10272) Prescriptions: ADIPEX-P 37.5 MG TABS (PHENTERMINE HCL) 1 by mouth once daily  #30 x 0   Entered and Authorized by:   Loreen Freud DO   Signed by:   Loreen Freud DO on 05/02/2010   Method used:   Print then Give to Patient   RxID:   [id number] LORAZEPAM 0.5 MG TABS (LORAZEPAM) 1 by mouth three times a day as needed  #60 x 0   Entered and Authorized by:   Loreen Freud DO   Signed by:   Loreen Freud DO on 05/02/2010   Method used:   Print then Give to Patient   RxID:   [id number] LORAZEPAM 0.5 MG TABS (LORAZEPAM) 1 by mouth three times a day as needed  #60 x 0   Entered and Authorized by:   Loreen Freud DO   Signed by:   Loreen Freud DO on 05/02/2010   Method used:   Historical   RxID:   [id number]    Medication Administration  Injection # 1:    Medication: Vit B12 1000 mcg    Diagnosis: B12 DEFICIENCY (ICD-266.2)    Route: IM    Site: L deltoid    Exp Date: 02/2012    Lot #: [id number]    Mfr: [employer]    Patient tolerated injection without complications    Given by: Army Fossa CMA (May 02, 2010 1:14 PM)  Orders Added: 1)  Admin of Therapeutic Inj  intramuscular or subcutaneous [96372] 2)  Vit B12 1000 mcg [J3420] 3)  Tdap => 28yrs IM [90715] 4)  Admin 1st Vaccine [90471] 5)  Est. Patient 40-64 years [99396] 6)  EKG w/ Interpretation [93000]     Immunizations Administered:  Tetanus Vaccine:    Vaccine Type: Tdap    Site: left deltoid    Mfr: GlaxoSmithKline     Dose: 0.5 ml    Route: IM    Given by: Army Fossa CMA    Exp. Date: 02/11/2012    Lot #: [id number]

## 2010-12-21 NOTE — Assessment & Plan Note (Signed)
Summary: PROBLEM WITH HER EARS   PH   Vital Signs:  Patient Profile:   45 Years Old Female Weight:      215.4 pounds Temp:     98.4 degrees F oral Pulse rate:   76 / minute BP sitting:   100 / 68  (left arm) Cuff size:   regular  Vitals Entered By: Shonna Chock (June 06, 2007 11:31 AM)               PCP:  Laury Axon  Chief Complaint:  EAR PROBLEMS AND DRAINAGE X 2 DAYS and URI symptoms.  History of Present Illness: Pt c/o itchy ear and drainage in throat.  Took tylenol sinus with no relief.  URI Symptoms      This is a 45 year old woman who presents with URI symptoms.  The symptoms began duration 47-2 days ago.  The severity is described as mild.  The patient reports sore throat, dry cough, and earache.  The patient denies fever, low-grade fever (<100.5 degrees), fever of 100.5-103 degrees, fever of 103.1-104 degrees, fever to >104 degrees, stiff neck, dyspnea, wheezing, rash, vomiting, diarrhea, use of an antipyretic, and response to antipyretic.  The patient denies itchy watery eyes, itchy throat, sneezing, seasonal symptoms, response to antihistamine, headache, muscle aches, and severe fatigue.  The patient denies the following risk factors for Strep sinusitis: unilateral facial pain, unilateral nasal discharge, poor response to decongestant, double sickening, tooth pain, Strep exposure, tender adenopathy, and absence of cough.    Current Allergies: No known allergies      Review of Systems      See HPI   Physical Exam  General:     Well-developed,well-nourished,in no acute distress; alert,appropriate and cooperative throughout examination Ears:     R ear canal swolllen Nose:     turbs errythema Mouth:     postnasal drip.   Neck:     No deformities, masses, or tenderness noted. Lungs:     Normal respiratory effort, chest expands symmetrically. Lungs are clear to auscultation, no crackles or wheezes. Heart:     normal rate, regular rhythm, and no murmur.   Msk:   No deformity or scoliosis noted of thoracic or lumbar spine.      Impression & Recommendations:  Problem # 1:  URI (ICD-465.9) xyzal once daily --samples mucinex as needed fluids if no better 3-4 days Rx Zpak.  Instructed on symptomatic treatment. Call if symptoms persist or worsen.   Problem # 2:  OBESITY NOS (ICD-278.00) Assessment: Improved adipex 37.5 daily RTO 1 month schedule CPE Diet and exericse reviewed with pt  Medications Added to Medication List This Visit: 1)  Zithromax Z-pak 250 Mg Tabs (Azithromycin) .... 2 by mouth day 1 then 1 by mouth once daily for 4 more days 2)  Adipex-p 37.5 Mg Caps (Phentermine hcl) .Marland Kitchen.. 1 by mouth once daily     Prescriptions: ADIPEX-P 37.5 MG  CAPS (PHENTERMINE HCL) 1 by mouth once daily  #30 x 0   Entered and Authorized by:   Loreen Freud DO   Signed by:   Loreen Freud DO on 06/06/2007   Method used:   Print then Give to Patient   RxID:   [id number] ZITHROMAX Z-PAK 250 MG  TABS (AZITHROMYCIN) 2 by mouth day 1 then 1 by mouth once daily for 4 more days  #1 pack x 0   Entered and Authorized by:   Loreen Freud DO   Signed by:   Loreen Freud  DO on 06/06/2007   Method used:   Print then Give to Patient   RxID:   [id number]

## 2010-12-21 NOTE — Assessment & Plan Note (Signed)
Summary: pulled muscle--acute only//tl   Vital Signs:  Patient Profile:   45 Years Old Female Weight:      223.50 pounds Temp:     97.6 degrees F oral Pulse rate:   88 / minute Pulse rhythm:   regular Resp:     17 per minute BP sitting:   118 / 82  (left arm) Cuff size:   large  Pt. in pain?   yes    Location:   back    Intensity:   10    Type:       aching  Vitals Entered By: Wendall Stade (December 02, 2007 2:01 PM)                  PCP:  Laury Axon  Chief Complaint:  pulled muscle and Back pain.  History of Present Illness: legs  swollen after travel in  lower 1/2 of body, took  2  fluid pill (10 mg Lasix) times two. She was  moving yesterday and back went out; pain 6 out of 10, using tylenol  Back Pain      This is a 45 year old woman who presents with Back pain.  The patient reports rest pain, but denies fever, chills, weakness, loss of sensation, fecal incontinence, urinary incontinence, urinary retention, dysuria, inability to work, and inability to care for self.  The pain is located in the mid low back.  The pain began at home and suddenly.  The pain radiates to the left hip.  The pain is made worse by flexion and activity.  The pain is made better by acetaminophen and heat.    Current Allergies: No known allergies   Past Medical History:    Reviewed history and no changes required:       disorder adjustment with anxiety       otitis externa, acute       upper respiratory infection       obesity, nos  Past Surgical History:    Reviewed history and no changes required:       cyst removed left breast       bladder tack       Hysterectomy       Tonsillectomy   Family History:    Reviewed history and no changes required:       mother died 71 of MI triple bypass age 66       father cad       maternal grandmother dm       mother lung cancer       both sides arthritis and depression/alcohol abuse  Social History:    Reviewed history and no changes  required:       Married       Never Smoked       Alcohol use-no   Risk Factors:  Tobacco use:  never Alcohol use:  no   Review of Systems  ENT      Complains of earache.      She took Cipro X 3 days while in [place]  CV      See HPI      Complains of swelling of feet.      Denies bluish discoloration of lips or nails, difficulty breathing at night, and difficulty breathing while lying down.      Minimal hand swelling   Physical Exam  General:     Well-developed,well-nourished,in no acute distress; alert,appropriate and cooperative throughout examination;uncomfortable-appearing.   Eyes:  No corneal or conjunctival inflammation noted. EOMI. Perrla. Ears:     Boggy R TM with debris Nose:     External nasal examination shows no deformity or inflammation. Nasal mucosa are pink and moist without lesions or exudates. Mouth:     Oral mucosa and oropharynx without lesions or exudates.  Teeth in good repair. Lungs:     Normal respiratory effort, chest expands symmetrically. Lungs are clear to auscultation, no crackles or wheezes. Heart:     normal rate, regular rhythm, no murmur, no gallop, no rub, no JVD, and no HJR.   Msk:     No deformity or scoliosis noted of thoracic or lumbar spine.   Pulses:     R and L dorsalis pedis and posterior tibial pulses are full and equal bilaterally Extremities:     neg Homan's; no edema Neurologic:     strength normal in all extremities and gait(heel / toe walking normal) but classic low back posture.  DTRs symmetrical and normal.  Neg SLR Skin:     Intact without suspicious lesions or rashes Cervical Nodes:     No lymphadenopathy noted Axillary Nodes:     No palpable lymphadenopathy    Impression & Recommendations:  Problem # 1:  EDEMA- LOCALIZED (ICD-782.3)  Orders: TLB-Creatinine, Blood (82565-CREA) TLB-BUN (Urea Nitrogen) (84520-BUN) TLB-Potassium (K+) (84132-K)   Problem # 2:  LOW BACK PAIN, ACUTE (ICD-724.2)  Her  updated medication list for this problem includes:    Ultram 50 Mg Tabs (Tramadol hcl) .Marland Kitchen... Take every 6 hrs as needed    Flexeril 5 Mg Tabs (Cyclobenzaprine hcl) .Marland Kitchen... 1 at bedtime as needed   Problem # 3:  OTITIS EXTERNA, ACUTE NEC (ICD-380.22)  Problem # 4:  UNSPECIFIED MYALGIA AND MYOSITIS (ICD-729.1)  Her updated medication list for this problem includes:    Ultram 50 Mg Tabs (Tramadol hcl) .Marland Kitchen... Take every 6 hrs as needed    Flexeril 5 Mg Tabs (Cyclobenzaprine hcl) .Marland Kitchen... 1 at bedtime as needed  Orders: TLB-CK Total Only(Creatine Kinase/CPK) (82550-CK)   Complete Medication List: 1)  Zithromax Z-pak 250 Mg Tabs (Azithromycin) .... 2 by mouth day 1 then 1 by mouth once daily for 4 more days 2)  Adipex-p 37.5 Mg Caps (Phentermine hcl) .Marland Kitchen.. 1 by mouth once daily 3)  Augmentin 875-125 Mg Tabs (Amoxicillin-pot clavulanate) .Marland Kitchen.. 1 by mouth two times a day 4)  Ativan 0.5 Mg Tabs (Lorazepam) .Marland Kitchen.. 1 by mouth three times a day as needed 5)  Floxin Otic Singles 0.3 % Soln (Ofloxacin) .Marland Kitchen.. 10 gtts r ear once daily for 7-10 days. 6)  Ultram 50 Mg Tabs (Tramadol hcl) .... Take every 6 hrs as needed 7)  Cortomycin 3.5-10000-1 Susp (Neomycin-polymyxin-hc) .... 3 drops tid 8)  Flexeril 5 Mg Tabs (Cyclobenzaprine hcl) .Marland Kitchen.. 1 at bedtime as needed   Patient Instructions: 1)  Stretching exercises as discussed.    Prescriptions: FLEXERIL 5 MG  TABS (CYCLOBENZAPRINE HCL) 1 at bedtime as needed  #10 x 0   Entered and Authorized by:   Marga Melnick MD   Signed by:   Marga Melnick MD on 12/02/2007   Method used:   Print then Give to Patient   RxID:   [id number] CORTOMYCIN 3.5-10000-1  SUSP (NEOMYCIN-POLYMYXIN-HC) 3 drops tid  #10cc x 0   Entered and Authorized by:   Marga Melnick MD   Signed by:   Marga Melnick MD on 12/02/2007   Method used:   Print then Give to Patient  RxID:   [id number] ULTRAM 50 MG  TABS (TRAMADOL HCL) take every 6 hrs as needed  #50 x 0   Entered and  Authorized by:   Marga Melnick MD   Signed by:   Marga Melnick MD on 12/02/2007   Method used:   Print then Give to Patient   RxID:   [id number]  ]

## 2010-12-21 NOTE — Progress Notes (Signed)
Summary: RX REQUEST FOR EAR  Phone Note Call from Patient Call back at Home Phone [phone]   Caller: Patient Reason for Call: Talk to Nurse Summary of Call: PT WITH LEFT EAR PAIN, WHICH BEGAN LAST NIGHT.  SHE STATES SHE HAS SEEN DR. Laury Axon FOR THIS SAME PROBLEM BEFORE.  PT HAD AN APPT SCHEDULED TO COME IN TODAY AT 2:50PM TO SEE DR. HOPPER, BUT HAS BEEN UNABLE TO FIND TRANSPORTATION TO BRING HER.  SHE IS REQUESTING THAT DR. Laury Axon SEND A RX TO [facility] [address].  PT STATES SHE IS UNABLE TO COME FOR AN APPT TOMORROW, 10-27-09, EITHER. Initial call taken by: Magdalen Spatz [facility],  October 26, 2009 1:48 PM  Follow-up for Phone Call        left message to call office.................Marland KitchenFelecia Deloach CMA  October 26, 2009 3:28 PM  pt c/o left ear pain, tenderness to touch,low grade fever x1days.pt denies any swelling, dizziness. pt last seen for this 07-28-08. pt uses [facility]. pt informed that she needs to be seen in office in order for med to be rx since last time seen for this problem has been over a year. pt unable to come in tomorrow due to death in family which has cause her to travel. pt [facility] to [facility] pt refused stating that she wants to see if something can be rx first before she goes there................Marland KitchenFelecia Deloach CMA  October 26, 2009 5:03 PM   Additional Follow-up for Phone Call Additional follow up Details #1::        Cortisporin otic suspension 3 drops qid , 10 cc Additional Follow-up by: Marga Melnick MD,  October 26, 2009 5:17 PM    Additional Follow-up for Phone Call Additional follow up Details #2::    pt aware rx sent to pharmacy and OVINB...................Marland KitchenFelecia Deloach CMA  October 26, 2009 5:21 PM   New/Updated Medications: CORTISPORIN-TC 3.01-19-09-0.5 MG/ML SUSP (NEOMYCIN-COLIST-HC-THONZONIUM) 3 drops qid Prescriptions: CORTISPORIN-TC 3.01-19-09-0.5 MG/ML SUSP (NEOMYCIN-COLIST-HC-THONZONIUM) 3 drops qid  #10cc x 0   Entered by:   Jeremy Johann  CMA   Authorized by:   Marga Melnick MD   Signed by:   Jeremy Johann CMA on 10/26/2009   Method used:   Faxed to ...       [phone]retail)       [address]       [place], [place]  [address]       Ph: [phone]       Fax: [phone]   RxID:   [id number]

## 2010-12-21 NOTE — Assessment & Plan Note (Signed)
Summary: B12/KN  Nurse Visit   Allergies: No Known Drug Allergies  Medication Administration  Injection # 1:    Medication: Vit B12 1000 mcg    Diagnosis: B12 DEFICIENCY (ICD-266.2)    Route: IM    Site: R deltoid    Exp Date: 02/2012    Lot #: [id number]    Mfr: [employer]    Patient tolerated injection without complications    Given by: Army Fossa CMA (May 09, 2010 1:58 PM)  Orders Added: 1)  Admin of Therapeutic Inj  intramuscular or subcutaneous [96372] 2)  Vit B12 1000 mcg [J3420]

## 2010-12-21 NOTE — Assessment & Plan Note (Signed)
Summary: hoarse,coughing congestion,fever/alr   Vital Signs:  Patient profile:   45 year old female Weight:      232 pounds Temp:     98.7 degrees F oral Pulse rate:   76 / minute Pulse rhythm:   regular BP sitting:   120 / 80  (left arm) Cuff size:   large  Vitals Entered By: Army Fossa CMA (September 12, 2009 11:49 AM) CC: cough congestion, very hoarse. fever off and on x 2 days. has tried nyquil , Cough   History of Present Illness:  Cough      This is a 45 year old woman who presents with Cough.  The symptoms began 3 days ago.  The patient reports productive cough, wheezing, and fever, but denies non-productive cough, pleuritic chest pain, shortness of breath, exertional dyspnea, hemoptysis, and malaise.  Associated symtpoms include cold/URI symptoms.  The patient denies the following symptoms: sore throat, nasal congestion, chronic rhinitis, weight loss, acid reflux symptoms, and peripheral edema.  The cough is worse with activity and lying down.  Ineffective prior treatments have included OTC cough medication.  Pt also c/o headache and nasal congestion.  Pt has taken nyquil and tylenol with no relief.     Current Medications (verified): 1)  Lorazepam 0.5 Mg Tabs (Lorazepam) .Marland Kitchen.. 1 By Mouth Three Times A Day As Needed 2)  Vitamin D (Ergocalciferol) 50000 Unit Caps (Ergocalciferol) .... Take One Tablet Weekly. 3)  Flexeril 10 Mg Tabs (Cyclobenzaprine Hcl) .Marland Kitchen.. 1 By Mouth Three Times A Day As Needed 4)  Vicodin Es 7.5-750 Mg Tabs (Hydrocodone-Acetaminophen) .Marland Kitchen.. 1 By Mouth Q6h As Needed 5)  Vitamin D3 2000 Unit Caps (Cholecalciferol) .Marland Kitchen.. 1 By Mouth Daily.  Allergies (verified): No Known Drug Allergies  Past History:  Past Medical History: Last updated: 12/02/2007 disorder adjustment with anxiety otitis externa, acute upper respiratory infection obesity, nos  Past Surgical History: Last updated: 12/02/2007 cyst removed left breast bladder  tack Hysterectomy Tonsillectomy  Family History: Last updated: Aug 21, 2008 mother died 11 of MI triple bypass age 57 father cad maternal grandmother dm mother lung cancer both sides arthritis and depression/alcohol abuse Family History of CAD Female 1st degree relative <60 Family History of CAD Female 1st degree relative <50  Social History: Last updated: 06/01/2008 Married Never Smoked Alcohol use-no Drug use-no Regular exercise-no  Risk Factors: Caffeine Use: 3 (06/01/2008) Exercise: no (06/01/2008)  Risk Factors: Smoking Status: never (12/02/2007)  Review of Systems      See HPI  Physical Exam  General:  alert and uncomfortable-appearing.   Head:  Normocephalic and atraumatic without obvious abnormalities. No apparent alopecia or balding. Ears:  External ear exam shows no significant lesions or deformities.  Otoscopic examination reveals clear canals, tympanic membranes are intact bilaterally without bulging, retraction, inflammation or discharge. Hearing is grossly normal bilaterally. Nose:  L frontal sinus tenderness, L maxillary sinus tenderness, R frontal sinus tenderness, and R maxillary sinus tenderness.   Mouth:  Oral mucosa and oropharynx without lesions or exudates.  Teeth in good repair. Neck:  No deformities, masses, or tenderness noted. Lungs:  Normal respiratory effort, chest expands symmetrically. Lungs are clear to auscultation, no crackles or wheezes. Heart:  Normal rate and regular rhythm. S1 and S2 normal without gallop, murmur, click, rub or other extra sounds. Skin:  Intact without suspicious lesions or rashes Cervical Nodes:  No lymphadenopathy noted Psych:  Oriented X3, normally interactive, and good eye contact.     Impression & Recommendations:  Problem # 1:  SINUSITIS- ACUTE-NOS (ICD-461.9)  Her updated medication list for this problem includes:    Avelox 400 Mg Tabs (Moxifloxacin hcl) .Marland Kitchen... 1 by mouth once daily    Tussionex Pennkinetic  Er 8-10 Mg/50ml Lqcr (Chlorpheniramine-hydrocodone) .Marland Kitchen... 1 tsp by mouth at bedtime prn    Nasacort Aq 55 Mcg/act Aers (Triamcinolone acetonide(nasal)) .Marland Kitchen... 2 sprays each nostril once daily  Instructed on treatment. Call if symptoms persist or worsen.   Orders: Flu A+B ([address])  Complete Medication List: 1)  Lorazepam 0.5 Mg Tabs (Lorazepam) .Marland Kitchen.. 1 by mouth three times a day as needed 2)  Vitamin D (ergocalciferol) 50000 Unit Caps (Ergocalciferol) .... Take one tablet weekly. 3)  Flexeril 10 Mg Tabs (Cyclobenzaprine hcl) .Marland Kitchen.. 1 by mouth three times a day as needed 4)  Vicodin Es 7.5-750 Mg Tabs (Hydrocodone-acetaminophen) .Marland Kitchen.. 1 by mouth q6h as needed 5)  Vitamin D3 2000 Unit Caps (Cholecalciferol) .Marland Kitchen.. 1 by mouth daily. 6)  Avelox 400 Mg Tabs (Moxifloxacin hcl) .Marland Kitchen.. 1 by mouth once daily 7)  Tussionex Pennkinetic Er 8-10 Mg/88ml Lqcr (Chlorpheniramine-hydrocodone) .Marland Kitchen.. 1 tsp by mouth at bedtime prn 8)  Nasacort Aq 55 Mcg/act Aers (Triamcinolone acetonide(nasal)) .... 2 sprays each nostril once daily Prescriptions: NASACORT AQ 55 MCG/ACT AERS (TRIAMCINOLONE ACETONIDE(NASAL)) 2 sprays each nostril once daily  #1 x 0   Entered and Authorized by:   Loreen Freud DO   Signed by:   Loreen Freud DO on 09/12/2009   Method used:   Historical   RxID:   [id number] TUSSIONEX PENNKINETIC ER 8-10 MG/5ML LQCR (CHLORPHENIRAMINE-HYDROCODONE) 1 tsp by mouth at bedtime prn  #6 oz x 0   Entered and Authorized by:   Loreen Freud DO   Signed by:   Loreen Freud DO on 09/12/2009   Method used:   Print then Give to Patient   RxID:   [id number] AVELOX 400 MG TABS (MOXIFLOXACIN HCL) 1 by mouth once daily  #10 x 0   Entered and Authorized by:   Loreen Freud DO   Signed by:   Loreen Freud DO on 09/12/2009   Method used:   [facility]        [facility] [phone]* (retail)       [address]       [place], [place]  [address]       Ph: [phone]       Fax:  [phone]   RxID:   [id number]

## 2010-12-21 NOTE — Letter (Signed)
Summary: Unable To Reach-Consult Scheduled  [facility] [place]  [address] [place], [place] [address]   Phone: [phone]  Fax: [phone]    08/29/2009 MRN: [id number]    Dear Ms. Augustine,   We have been unable to reach you by phone.  Please contact our office with an updated phone number.      Thank you,  Army Fossa CMA  August 29, 2009 2:58 PM

## 2010-12-21 NOTE — Letter (Signed)
Summary: Results Follow-up Letter  [place] [facility]  [address] [place], [place] [address]   Phone: [phone]  Fax: [phone]    06/03/2008        Mikayla [name] [address] [place], [place]  [address]  Dear Ms. Peral,   The following are the results of your recent test(s):  Test     Result     Pap Smear    Normal_______  Not Normal_____       Comments: _________________________________________________________ Cholesterol LDL(Bad cholesterol):          Your goal is less than:         HDL (Good cholesterol):        Your goal is more than: _________________________________________________________ Other Tests:   _________________________________________________________  Please call for an appointment Or _Please see attached lab report and call office to reschedule pap in 3-6 months.________________________________________________________ _________________________________________________________ _________________________________________________________  Sincerely,  Ardyth Man [place] [facility]

## 2010-12-21 NOTE — Assessment & Plan Note (Signed)
Summary: EARACHE//KN   Vital Signs:  Patient profile:   45 year old female Weight:      239 pounds Temp:     98.1 degrees F oral Pulse rate:   90 / minute Pulse rhythm:   regular BP sitting:   128 / 84  (left arm) Cuff size:   large  Vitals Entered By: Army Fossa CMA (August 23, 2010 4:08 PM) CC: Pt here for "earache", and facial pain Comments Has been going on for 3 days.  [facility] [address]   History of Present Illness: 3 days' history of right facial pain. The pain is sharp and located around her right eye, right forehead, right cheek, ear and jaw. Today she noted a rash at the right scalp- temple  ROS She was seen here with a UTI, since then she has seen her urologist and is now on chronic daily antibiotics. Denies recent fever and no URI symptoms except for a mild sore throat; no nasal d/c   Allergies: No Known Drug Allergies  Past History:  Past Medical History: Reviewed history from 08/01/2010 and no changes required. disorder adjustment with anxiety obesity, nos  Past Surgical History: Reviewed history from 05/02/2010 and no changes required. cyst removed left breast bladder tack, June 2010 Hysterectomy, no oophorectomy Tonsillectomy bladder strap 2010 ant repair vagina 2010  Social History: Reviewed history from 06/01/2008 and no changes required. Married Never Smoked Alcohol use-no Drug use-no Regular exercise-no  Physical Exam  General:  alert and well-developed.   Head:  face is symmetric, not tender to percussion over the mastoid area Eyes:  EOMI no conjuntival erythema ant chambers wnl  Ears:  L ear normal.  right tympanic membrane slightly red, no bulge, no trago  sign Nose:  no congestive Mouth:  no redness or discharge Neck:  full range of motion no LAD  Skin:  at the right scalp close to the temple she has a regular, papular, 4x4cm red spot, on close inspection there is a question of  minute vesicles   Impression &  Recommendations:  Problem # 1:  FACIAL PAIN (ICD-784.0) patient presents with right-sided facial pain and a rash that started today The presentation suggests more herpes/shingles than  acute right otitis media  (although the right tympanic membrane is clearly red) I think it is important to treat her as shingles even if we "over treat her" because if it is indeed shingles she may get worse. Plan: Valtrex amoxicillin Will call if not better soon  Her updated medication list for this problem includes:    Tylenol With Codeine #3 300-30 Mg Tabs (Acetaminophen-codeine) .Marland Kitchen... 1 or by mouth at bedtime as needed pain  Problem # 2:  FREQUENCY, URINARY (ICD-788.41) she dis see  her urologist recently, she is now on daily antibiotics. Urology to follow up on this problem per patient Her updated medication list for this problem includes:    Macrobid 100 Mg Caps (Nitrofurantoin monohyd macro) .Marland Kitchen..Marland Kitchen Two times a day  Complete Medication List: 1)  Lorazepam 0.5 Mg Tabs (Lorazepam) .Marland Kitchen.. 1 by mouth three times a day as needed 2)  Macrobid 100 Mg Caps (Nitrofurantoin monohyd macro) .... Two times a day 3)  Valtrex 1 Gm Tabs (Valacyclovir hcl) .... One by mouth 3 times a day for one week 4)  Amoxicillin 500 Mg Tabs (Amoxicillin) .... 2 by mouth two times a day 5)  Tylenol With Codeine #3 300-30 Mg Tabs (Acetaminophen-codeine) .Marland Kitchen.. 1 or by mouth at bedtime as  needed pain  Patient Instructions: 1)  call if no better in few days 2)  call any time if worse, the rash gets worse  Prescriptions: TYLENOL WITH CODEINE #3 300-30 MG TABS (ACETAMINOPHEN-CODEINE) 1 or by mouth at bedtime as needed pain  #20 x 0   Entered and Authorized by:   Nolon Rod. Paz MD   Signed by:   Nolon Rod. Paz MD on 08/23/2010   Method used:   Print then Give to Patient   RxID:   [id number] AMOXICILLIN 500 MG TABS (AMOXICILLIN) 2 by mouth two times a day  #28 x 0   Entered and Authorized by:   Nolon Rod. Paz MD   Signed by:   Nolon Rod.  Paz MD on 08/23/2010   Method used:   Electron[facility] to        [facility] [phone]* (retail)       [address]       [place], [place]  [address]       Ph: [phone]       Fax: [phone]   RxID:   [id number] VALTREX 1 GM TABS (VALACYCLOVIR HCL) one by mouth 3 times a day for one week  #21 x 0   Entered and Authorized by:   Nolon Rod. Paz MD   Signed by:   Nolon Rod. Paz MD on 08/23/2010   Method used:   [facility]ronically to        [facility] [phone]* (retail)       [address]       [place], [place]  [address]       Ph: [phone]       Fax: [phone]   RxID:   [id number]

## 2010-12-21 NOTE — Letter (Signed)
Summary: Results Follow up Letter   at [place]  [address] [place], [place] [address]   Phone: [phone]  Fax: [phone]    12/08/2007 MRN: [id number]  AWA BACHICHA [address] [place], [place]  [address]  Dear Ms. Yassin,  The following are the results of your recent test(s):  Test         Result    Pap Smear:        Normal _____  Not Normal _____ Comments: ______________________________________________________ Cholesterol: LDL(Bad cholesterol):         Your goal is less than:         HDL (Good cholesterol):       Your goal is more than: Comments:  ______________________________________________________ Mammogram:        Normal _____  Not Normal _____ Comments:  ___________________________________________________________________ Hemoccult:        Normal _____  Not normal _______ Comments:    _____________________________________________________________________ Other Tests:  Please see attached results and comments   We routinely do not discuss normal results over the telephone.  If you desire a copy of the results, or you have any questions about this information we can discuss them at your next office visit.   Sincerely,
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
## 2010-12-21 NOTE — Progress Notes (Signed)
Summary: cpx appt pending [id number]-dr lowne  Phone Note Call from Patient Call back at Home Phone [phone]   Caller: Patient Summary of Call: fye - cpx scheduled [id number] Initial call taken by: Okey Regal [place],  August 05, 2007 2:56 PM

## 2010-12-21 NOTE — Progress Notes (Signed)
Summary: refill  Phone Note Refill Request Message from:  Fax from Pharmacy on August 29, 2010 9:23 AM  Refills Requested: Medication #1:  LORAZEPAM 0.5 MG TABS 1 by mouth three times a day as needed Ivar Bury - fax [phone]  Initial call taken by: Okey Regal [place],  August 29, 2010 9:23 AM  Follow-up for Phone Call        last refilled 05/19/10. Follow-up by: Army Fossa CMA,  August 29, 2010 9:25 AM  Additional Follow-up for Phone Call Additional follow up Details #1::        RF at today's visit Additional Follow-up by: [facility] E. Paz MD,  August 29, 2010 5:16 PM

## 2010-12-21 NOTE — Assessment & Plan Note (Signed)
Summary: recheck shingles/cbs   Vital Signs:  Patient profile:   45 year old female Weight:      237.50 pounds Pulse rate:   79 / minute Pulse rhythm:   regular BP sitting:   124 / 80  (left arm) Cuff size:   large  Vitals Entered By: Army Fossa CMA (September 14, 2010 11:07 AM) CC: Recheck shingels- fasting Comments still in a lot of pain [facility]   History of Present Illness: followup from last office visit Good compliance with medicines tolerating gabapentin 3 times a day, make her little sleepy As far as the pain, has good days and bad days, is described as very sensitive in the right side of the face even to cold wind  ROS She saw her dentist last week, everything seemed okay Rash is essentially gone up slightly her pain better Complain about fatigue and not feeling energetic, likes to know why and if this is related with shingles   Current Medications (verified): 1)  Lorazepam 0.5 Mg Tabs (Lorazepam) .Marland Kitchen.. 1 By Mouth Three Times A Day As Needed 2)  Tylenol With Codeine #3 300-30 Mg Tabs (Acetaminophen-Codeine) .Marland Kitchen.. 1 or By Mouth At Bedtime As Needed Pain 3)  Gabapentin 300 Mg Caps (Gabapentin) .... One By Mouth At Bedtime For 3 Days, Then One By Mouth Twice A Day For 3 Days, Then One By Mouth 3 Times A Day  Allergies (verified): No Known Drug Allergies  Past History:  Past Medical History: Reviewed history from 08/01/2010 and no changes required. disorder adjustment with anxiety obesity, nos  Past Surgical History: Reviewed history from 05/02/2010 and no changes required. cyst removed left breast bladder tack, June 2010 Hysterectomy, no oophorectomy Tonsillectomy bladder strap 2010 ant repair vagina 2010  Social History: Reviewed history from 06/01/2008 and no changes required. Married Never Smoked Alcohol use-no Drug use-no Regular exercise-no  Physical Exam  General:  alert and well-developed.   Ears:  L ear normal.   right tympanic  membrane no  bulge, very small amount of dry  debris in the ear canal; neg  trago sign  Neurologic:  external ocular movements intact Face symmetric Motor symmetric Speech clear and fluent Skin:  no rashes noted in the face   Impression & Recommendations:  Problem # 1:  FACIAL PAIN (ICD-784.0) symptoms consistent with shingles, overall improving. Unclear if fatigue is related to shingles, could be related to Neurontin. We'll change Neurontin from 300 mg  t.i.d. to 1 in the morning and 2 at bedtime  Her updated medication list for this problem includes:    Tylenol With Codeine #3 300-30 Mg Tabs (Acetaminophen-codeine) .Marland Kitchen... 1 or by mouth at bedtime as needed pain  Orders: UA Dipstick w/o Micro (automated)  (81003)  Problem # 2:  FATIGUE (ICD-780.79)  recent blood work normal Check a B12 level,history of B12 deficiency also see #1  Orders: TLB-B12 + Folate Pnl ([address]_60[address]-U98/JXB) Venipuncture (14782)  Problem # 3:  UTI'S, RECURRENT (ICD-599.0) recheck UA, asx  The following medications were removed from the medication list:    Macrobid 100 Mg Caps (Nitrofurantoin monohyd macro) .Marland Kitchen..Marland Kitchen Two times a day  Complete Medication List: 1)  Lorazepam 0.5 Mg Tabs (Lorazepam) .Marland Kitchen.. 1 by mouth three times a day as needed 2)  Tylenol With Codeine #3 300-30 Mg Tabs (Acetaminophen-codeine) .Marland Kitchen.. 1 or by mouth at bedtime as needed pain 3)  Gabapentin 300 Mg Caps (Gabapentin) .Marland Kitchen.. 1 in the morning, 2 at bedtime  Other Orders: T-Urine Microscopic (  [id number]) T-Culture, Urine ([id number])  Patient Instructions: 1)  Please schedule a follow-up appointment in 1 month with your primary care doctor. 2)  The facial discomfort increase or is not steadily improving, let us know in 2 weeks Prescriptions: GABAPENTIN 300 MG CAPS (GABAPENTIN) 1 in the morning, 2 at bedtime  #90 x 3   Entered and Authorized by:   Elita Quick E. Paz MD   Signed by:   Nolon Rod. Paz MD on 09/14/2010   Method used:    [facility] to        [facility] [phone][facility] (retail)       [address]       [place], [place]  [address]       Ph: [phone]       Fax: [phone]   RxID:   [id number]    Orders Added: 1)  TLB-B12 + Folate Pnl [[id number]-B12/FOL] 2)  Venipuncture [84166] 3)  T-Urine Microscopic [[id number]] 4)  T-Culture, Urine [[id number]] 5)  UA Dipstick w/o Micro (automated)  [81003] 6)  Est. Patient Level III [25427]

## 2010-12-21 NOTE — Progress Notes (Signed)
Summary: Refill Request  Phone Note Refill Request Call back at [phone] Message from:  Pharmacy on November 30, 2010 10:57 AM  Refills Requested: Medication #1:  LORAZEPAM 0.5 MG TABS 1 by mouth three times a day as needed   Dosage confirmed as above?Dosage Confirmed   Supply Requested: 60   [facility]  Next Appointment Scheduled: none Initial call taken by: Harold Barban,  November 30, 2010 10:57 AM  Follow-up for Phone Call        last seen 09/14/10 and filled 10/23/10. please advise Follow-up by: Almeta Monas CMA Duncan Dull),  November 30, 2010 1:18 PM  Additional Follow-up for Phone Call Additional follow up Details #1::        refill x1 Additional Follow-up by: Loreen Freud DO,  November 30, 2010 1:55 PM    Prescriptions: LORAZEPAM 0.5 MG TABS (LORAZEPAM) 1 by mouth three times a day as needed  #60 x 0   Entered by:   Almeta Monas CMA (AAMA)   Authorized by:   Loreen Freud DO   Signed by:   Almeta Monas CMA (AAMA) on 11/30/2010   Method used:   Printed then faxed to ...       [facility] [phone]* (retail)       [address]       [place], [place]  [address]       Ph: [phone]       Fax: [phone]   RxID:   [id number]

## 2010-12-21 NOTE — Letter (Signed)
Summary: Results Follow-up Letter  [place] at [facility]  [address] [place], [place] [address]   Phone: [phone]  Fax: [phone]    06/14/2008        Xandrea Clarey [address] [place], [place]  [address]  Dear Ms. Lemen,   The following are the results of your recent test(s):  Test     Result     Pap Smear    Normal_______  Not Normal_____       Comments: _________________________________________________________ Cholesterol LDL(Bad cholesterol):          Your goal is less than:         HDL (Good cholesterol):        Your goal is more than: _________________________________________________________ Other Tests:   _________________________________________________________  Please call for an appointment Or _Please see attached lab report.________________________________________________________ _________________________________________________________ _________________________________________________________  Sincerely,  Ardyth Man [place] at [facility]

## 2010-12-21 NOTE — Progress Notes (Signed)
Summary: Mikayla [name] - refill, carol - needs cpx  Phone Note Refill Request Call back at [facility] Phone [phone] Call back at Work Phone [phone] Message from:  [facility] [address]  Refills Requested: Medication #1:  furosemide 40mg  Pharmacy requesting rx for furosemide 40mg  1 by mouth once daily as needed #30.  This was removed from med list 06/2009 "patient no longer taking".....  ok to refill??    Patient is also due for cpx -- last cpx 05/2008.  Since then patient has only been seen for acute only visits -- will forward to [facility] to schedule cpx     Method Requested: Electronic Initial call taken by: Shary Decamp,  Mar 25, 2010 8:53 AM  Follow-up for Phone Call        ok to refill x1--- pt needs cpe Follow-up by: Loreen Freud DO,  Mar 27, 2010 10:33 AM    New/Updated Medications: FUROSEMIDE 40 MG TABS (FUROSEMIDE) 1 by mouth once daily. NEEDS APPT BEFORE ADDITIONAL REFILLS. Prescriptions: FUROSEMIDE 40 MG TABS (FUROSEMIDE) 1 by mouth once daily. NEEDS APPT BEFORE ADDITIONAL REFILLS.  #30 x 0   Entered by:   Army Fossa CMA   Authorized by:   Loreen Freud DO   Signed by:   Army Fossa CMA on 03/27/2010   Method used:   Electronically to        [facility] [phone]* (retail)       [address]       [place], [place]  [address]       Ph: [phone]       Fax: [phone]   RxID:   [id number]

## 2010-12-21 NOTE — Letter (Signed)
Summary: Results Follow-up Letter  [place] at [facility]  [address] [place], [place] [address]   Phone: [phone]  Fax: [phone]    09/14/2008        Mikayla [name] [address] [place], [place]  [address]  Dear Mikayla [name],   The following are the results of your recent test(s):  Test     Result     Pap Smear    Normal_______  Not Normal_____       Comments: _________________________________________________________ Cholesterol LDL(Bad cholesterol):          Your goal is less than:         HDL (Good cholesterol):        Your goal is more than: _________________________________________________________ Other Tests:   _________________________________________________________  Please call for an appointment Or __NORMAL STRESS TEST._______________________________________________________ _________________________________________________________ _________________________________________________________  Sincerely,  Ardyth Man [place] at [facility]

## 2010-12-21 NOTE — Progress Notes (Signed)
Summary: lorazepam refill   Phone Note Refill Request Message from:  Fax from Pharmacy on October 23, 2010 11:45 AM  Refills Requested: Medication #1:  LORAZEPAM 0.5 MG TABS 1 by mouth three times a day as needed [facility] - fax [phone]  Initial call taken by: Okey Regal [place],  October 23, 2010 11:46 AM  Follow-up for Phone Call        last filled 08/29/10 and seen 09/14/10. PLease advise Follow-up by: Almeta Monas CMA Duncan Dull),  October 23, 2010 11:57 AM  Additional Follow-up for Phone Call Additional follow up Details #1::        refill x1 Additional Follow-up by: Loreen Freud DO,  October 23, 2010 12:08 PM    Prescriptions: LORAZEPAM 0.5 MG TABS (LORAZEPAM) 1 by mouth three times a day as needed  #60 x 0   Entered by:   Doristine Devoid CMA   Authorized by:   Loreen Freud DO   Signed by:   Doristine Devoid CMA on 10/23/2010   Method used:   Telephoned to ...       [facility] [phone]* (retail)       [address]       [place], [place]  [address]       Ph: [phone]       Fax: [phone]   RxID:   [id number]

## 2010-12-21 NOTE — Assessment & Plan Note (Signed)
Summary: b12 inj//lch  Nurse Visit   Allergies: No Known Drug Allergies  Medication Administration  Injection # 1:    Medication: Vit B12 1000 mcg    Diagnosis: B12 DEFICIENCY (ICD-266.2)    Route: IM    Site: L deltoid    Exp Date: 02/2012    Lot #: [id number]    Mfr: [employer]    Patient tolerated injection without complications    Given by: Army Fossa CMA (April 25, 2010 3:39 PM)  Orders Added: 1)  Admin of Therapeutic Inj  intramuscular or subcutaneous [96372] 2)  Vit B12 1000 mcg [J3420]

## 2010-12-21 NOTE — Letter (Signed)
Summary: Primary Care Appointment Letter  [place] [facility] [place]  [address] [place], [place] [address]   Phone: [phone]  Fax: [phone]    03/31/2010 MRN: [id number]  Mikayla [name] [address] [place], [place]  [address]  Dear Ms. [facility],   Your Primary Care Physician Loreen Freud DO has indicated that:    _______it is time to schedule an appointment.    _______you missed your appointment on______ and need to call and          reschedule.    _______you need to have lab work done.    _______you need to schedule an appointment discuss lab or test results.    _______you need to call to reschedule your appointment that is                       scheduled on _________.     Please call our office as soon as possible. Our phone number is [phone]_____. Please press option 1. Our office is open 8a-12noon and 1p-5p, Monday through Friday.     Thank you,     Primary Care Scheduler

## 2010-12-21 NOTE — Letter (Signed)
Summary: Patient Not Returning Calls/[facility]  Patient Not Returning Calls/[facility]   Imported By: Lanelle Bal 08/11/2010 14:20:52  _____________________________________________________________________  External Attachment:    Type:   Image     Comment:   External Document

## 2010-12-21 NOTE — Assessment & Plan Note (Signed)
Summary: URINARY FREQUENCY/RH.......Marland Kitchen   Vital Signs:  Patient profile:   45 year old female Weight:      235.13 pounds Pulse rate:   91 / minute Pulse rhythm:   regular BP sitting:   128 / 84  (left arm) Cuff size:   large  Vitals Entered By: Army Fossa CMA (August 01, 2010 9:16 AM) CC: Pt here c/o Urinary Frequency.  CBG Result 93 Comments Mostly at night. Pain in RUQ. going on from 1-2 weeks. [facility] [address]   History of Present Illness: 2 weeks history of nocturia, waking up 4 times a night to go to the bathroom. no associated dysuria or gross hematuria, urine smells slightly different   Also has a ill-defined right upper quadrant pain on and off, "like a knife", last a few seconds, may or may not be related with food intake. No history of gallbladder surgery  ROS ? Low-grade fever for a few days No nausea or vomiting, good p.o. tolerance although appetite is slightly decreased, some upper abdominal fullness Bowel movements are at baseline, she is usually constipated. no cough, chest pain or difficulty breathing    Current Medications (verified): 1)  Lorazepam 0.5 Mg Tabs (Lorazepam) .Marland Kitchen.. 1 By Mouth Three Times A Day As Needed  Allergies (verified): No Known Drug Allergies  Past History:  Past Medical History: disorder adjustment with anxiety obesity, nos  Past Surgical History: Reviewed history from 05/02/2010 and no changes required. cyst removed left breast bladder tack, June 2010 Hysterectomy, no oophorectomy Tonsillectomy bladder strap 2010 ant repair vagina 2010  Social History: Reviewed history from 06/01/2008 and no changes required. Married Never Smoked Alcohol use-no Drug use-no Regular exercise-no  Physical Exam  General:  alert, well-developed, and overweight-appearing.   Abdomen:  soft and no distention.  slightly tender at the right upper quadrant and right lower quadrant (@ McBur[place]) of the abdomen. No mass or  rebound. Forced flexion of the legs over the abdomen no cause pain. bowel sounds present  Extremities:  no lower extremity edema   Impression & Recommendations:  Problem # 1:  ? of UTI (ICD-599.0) presents with nocturia without dysuria. 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 No history of appendectomy or cholecystectomy No high fever, but T today 99.8   no mass or rebound. Appetite is slightly  decreased  plan: CT abdomen ----(cell [phone]) CBC, LFTs, amylase and lipase     Orders: Venipuncture ([address]) TLB-Hepatic/Liver Function Pnl (80076-HEPATIC) TLB-CBC Platelet - w/Differential (85025-CBCD) TLB-Amylase (82150-AMYL) TLB-Lipase (83690-LIPASE) Specimen Handling (29528) Radiology Referral (Radiology) T-Culture, Urine (41324-40102) T-Urine Microscopic (72536-64403)  Complete Medication List: 1)  Lorazepam 0.5 Mg Tabs (Lorazepam) .Marland Kitchen.. 1 by mouth three times a day as needed  Other Orders: UA Dipstick w/o Micro (automated)  (81003) Glucose, (CBG) [phone])  Laboratory Results   Urine Tests   Date/Time Reported: August 01, 2010 9:10 AM   Routine Urinalysis   Color: yellow Appearance: Clear Glucose: negative   (Normal Range: Negative) Bilirubin: negative   (Normal Range: Negative) Ketone: negative   (Normal Range: Negative) Spec. Gravity: 1.010   (Normal Range: 1.003-1.035) Blood: negative   (Normal Range: Negative) pH: 6.0   (Normal Range: 5.0-8.0) Protein: negative   (Normal Range: Negative) Urobilinogen: negative   (Normal Range: 0-1) Nitrite: negative   (  Normal Range: Negative) Leukocyte Esterace: negative   (Normal Range: Negative)    Comments: Floydene Flock  August 01, 2010 9:11 AM   Blood Tests     CBG Random:  93

## 2010-12-21 NOTE — Progress Notes (Signed)
Summary: Refill Request  Phone Note Refill Request Call back at [phone] Message from:  Pharmacy on Mar 27, 2010 8:51 AM  Refills Requested: Medication #1:  LORAZEPAM 0.5 MG TABS 1 by mouth three times a day as needed   Dosage confirmed as above?Dosage Confirmed   Supply Requested: 1 month   [facility]  Next Appointment Scheduled: none Initial call taken by: Harold Barban,  Mar 27, 2010 8:52 AM  Follow-up for Phone Call        refill x1---needs cpe Follow-up by: Loreen Freud DO,  Mar 27, 2010 10:34 AM    Prescriptions: LORAZEPAM 0.5 MG TABS (LORAZEPAM) 1 by mouth three times a day as needed  #30 x 0   Entered by:   Army Fossa CMA   Authorized by:   Loreen Freud DO   Signed by:   Army Fossa CMA on 03/27/2010   Method used:   Printed then faxed to ...       [facility] [phone]* (retail)       [address]       [place], [place]  [address]       Ph: [phone]       Fax: [phone]   RxID:   [id number]

## 2010-12-21 NOTE — Assessment & Plan Note (Signed)
Summary: b-12/cbs  Nurse Visit   Allergies: No Known Drug Allergies  Medication Administration  Injection # 1:    Medication: Vit B12 1000 mcg    Diagnosis: B12 DEFICIENCY (ICD-266.2)    Route: IM    Site: R deltoid    Exp Date: 12/2011    Lot #: [id number]    Mfr: [employer]    Patient tolerated injection without complications    Given by: Army Fossa CMA (Apr 18, 2010 4:11 PM)  Orders Added: 1)  Admin of Therapeutic Inj  intramuscular or subcutaneous [96372] 2)  Vit B12 1000 mcg [J3420]

## 2010-12-21 NOTE — Progress Notes (Signed)
Summary: pls fax latest urine test results to [facility]  Phone Note Call from Patient   Caller: Patient Summary of Call: patient needs latest urine test results faxed to Dr [name] at [facility] on [address]---she did not know fax number--please send    **ATTN: BRENDA** Initial call taken by: Jerolyn Shin,  September 21, 2010 2:14 PM  Follow-up for Phone Call        faxed.  Follow-up by: Army Fossa CMA,  September 21, 2010 2:23 PM

## 2010-12-21 NOTE — Progress Notes (Signed)
Summary: Refill Request(Controlled Med)  Phone Note Refill Request   Refills Requested: Medication #1:  LORAZEPAM 0.5 MG TABS 1 by mouth three times a day as needed   Last Refilled: 09/16/2009   Notes: Last OV 09/12/2009(acute) [facility] [address]   Method Requested: Fax to Local Pharmacy Initial call taken by: Shonna Chock,  October 17, 2009 2:27 PM  Follow-up for Phone Call        ok to refill x1 Follow-up by: Loreen Freud DO,  October 17, 2009 3:51 PM    Prescriptions: LORAZEPAM 0.5 MG TABS (LORAZEPAM) 1 by mouth three times a day as needed  #30 x 1   Entered by:   Army Fossa CMA   Authorized by:   Loreen Freud DO   Signed by:   Army Fossa CMA on 10/17/2009   Method used:   Printed then faxed to ...       [facility] [phone]* (retail)       [address]       [place], [place]  [address]       Ph: [phone]       Fax: [phone]   RxID:   [id number]

## 2010-12-21 NOTE — Progress Notes (Signed)
Summary: [facility] 08/31/08, 09/03/08, 09/07/2008  Phone Note Call from Patient Call back at Work Phone [phone]   Reason for Call: Talk to Nurse Summary of Call: Could not understand patient's message on answering machine. Understood telephone # and DOB- Left message for patient to call the office or her pharmacy in regards to a refill. Ardyth Man  August 31, 2008 11:57 AM   Follow-up for Phone Call        Left message for patient to call the office Ardyth Man  September 03, 2008 12:31 PM  Follow-up by: Ardyth Man,  September 03, 2008 12:31 PM  Additional Follow-up for Phone Call Additional follow up Details #1::        Left message for patient to call the office. Ardyth Man  September 07, 2008 11:26 AM  Additional Follow-up by: Ardyth Man,  September 07, 2008 11:26 AM    Additional Follow-up for Phone Call Additional follow up Details #2::    Refill done x 1 see refill note. Ardyth Man  September 09, 2008 12:21 PM  Follow-up by: Ardyth Man,  September 09, 2008 12:21 PM

## 2010-12-21 NOTE — Assessment & Plan Note (Signed)
Summary: weight gain/cbs   Vital Signs:  Patient profile:   45 year old female Height:      62 inches Weight:      238.50 pounds BMI:     43.78 Pulse rate:   78 / minute Pulse rhythm:   regular BP sitting:   110 / 72  (left arm) Cuff size:   large  Vitals Entered By: Army Fossa CMA (Apr 11, 2010 10:24 AM) CC: Pt here states that when she urinates it itches x 2-3 days. No discharge. Strong urine smell., Dysuria   History of Present Illness:  Dysuria      This is a 45 year old woman who presents with Dysuria.  The symptoms began 2 days ago.  The patient complains of burning with urination and urinary frequency, but denies urgency, hematuria, vaginal discharge, vaginal itching, vaginal sores, and penile discharge.  The patient denies the following associated symptoms: nausea, vomiting, fever, shaking chills, flank pain, abdominal pain, back pain, pelvic pain, and arthralgias.  The patient denies the following risk factors: diabetes, prior antibiotics, immunosuppression, history of GU anomaly, history of pyelonephritis, pregnancy, history of STD, and analgesic abuse.  History is significant for > 3 UTIs in one year.    Pt is also frustrated with her weight gain.  She admits to not exercising enough or eating right.  She is willing to go to the nutritionist.  Allergies (verified): No Known Drug Allergies  Past History:  Past medical, surgical, family and social histories (including risk factors) reviewed for relevance to current acute and chronic problems.  Past Medical History: Reviewed history from 12/02/2007 and no changes required. disorder adjustment with anxiety otitis externa, acute upper respiratory infection obesity, nos  Past Surgical History: Reviewed history from 11/30/2009 and no changes required. cyst removed left breast bladder tack, June 2010 Hysterectomy, no oophorectomy Tonsillectomy  Family History: Reviewed history from 07/28/2008 and no changes  required. mother died 42 of MI triple bypass age 89 father cad maternal grandmother dm mother lung cancer both sides arthritis and depression/alcohol abuse Family History of CAD Female 1st degree relative <60 Family History of CAD Female 1st degree relative <50  Social History: Reviewed history from 06/01/2008 and no changes required. Married Never Smoked Alcohol use-no Drug use-no Regular exercise-no  Review of Systems      See HPI  Physical Exam  General:  Well-developed,well-nourished,in no acute distress; alert,appropriate and cooperative throughout examinationoverweight-appearing.   Lungs:  Normal respiratory effort, chest expands symmetrically. Lungs are clear to auscultation, no crackles or wheezes. Heart:  Normal rate and regular rhythm. S1 and S2 normal without gallop, murmur, click, rub or other extra sounds. Extremities:  No clubbing, cyanosis, edema, or deformity noted with normal full range of motion of all joints.   Psych:  Oriented X3, normally interactive, good eye contact, not anxious appearing, and not depressed appearing.     Impression & Recommendations:  Problem # 1:  UTI'S, RECURRENT (ICD-599.0)  The following medications were removed from the medication list:    Bactrim Ds 800-160 Mg Tabs (Sulfamethoxazole-trimethoprim) ..... One twice a day for 10 days Her updated medication list for this problem includes:    Cipro 500 Mg Tabs (Ciprofloxacin hcl) .Marland Kitchen... 1 by mouth two times a day  Orders: T-Culture, Urine ([address]81-19147) Urology Referral (Urology) Venipuncture [phone]) TLB-B12 + Folate Pnl (21308_65784-O96/EXB) TLB-Lipid Panel (80061-LIPID) TLB-BMP (Basic Metabolic Panel-BMET) (80048-METABOL) TLB-CBC Platelet - w/Differential (85025-CBCD) TLB-Hepatic/Liver Function Pnl (80076-HEPATIC) TLB-TSH (Thyroid Stimulating Hormone) (84443-TSH) T-Vitamin D (25-Hydroxy) (  480 003 5585) UA Dipstick w/o Micro (manual) ([address]11) EKG w/ Interpretation  (93000)  Encouraged to push clear liquids, get enough rest, and take acetaminophen as needed. To be seen in 10 days if no improvement, sooner if worse.  Problem # 2:  MORBID OBESITY (ICD-278.01) adipex  rto 1 month encouraged improving exercise and diet Orders: Nutrition Referral (Nutrition) Venipuncture (91478) TLB-B12 + Folate Pnl (29562_13086-V78/ION) TLB-Lipid Panel (80061-LIPID) TLB-BMP (Basic Metabolic Panel-BMET) (80048-METABOL) TLB-CBC Platelet - w/Differential (85025-CBCD) TLB-Hepatic/Liver Function Pnl (80076-HEPATIC) TLB-TSH (Thyroid Stimulating Hormone) (84443-TSH) T-Vitamin D (25-Hydroxy) (62952-84132) UA Dipstick w/o Micro (manual) (44010) EKG w/ Interpretation (93000)  Ht: 62 (04/11/2010)   Wt: 238.50 (04/11/2010)   BMI: 43.78 (04/11/2010)  Complete Medication List: 1)  Lorazepam 0.5 Mg Tabs (Lorazepam) .Marland Kitchen.. 1 by mouth three times a day as needed 2)  Cipro 500 Mg Tabs (Ciprofloxacin hcl) .Marland Kitchen.. 1 by mouth two times a day 3)  Adipex-p 37.5 Mg Tabs (Phentermine hcl) .Marland Kitchen.. 1 by mouth once daily  Patient Instructions: 1)  Please schedule a follow-up appointment in 1 month.  2)  It is important that you exercise reguarly at least 20 minutes 5 times a week. If you develop chest pain, have severe difficulty breathing, or feel very tired, stop exercising immediately and seek medical attention.  3)  You need to lose weight. Consider a lower calorie diet and regular exercise.  Prescriptions: ADIPEX-P 37.5 MG TABS (PHENTERMINE HCL) 1 by mouth once daily  #30 x 0   Entered and Authorized by:   Loreen Freud DO   Signed by:   Loreen Freud DO on 04/11/2010   Method used:   Print then Give to Patient   RxID:   [id number] CIPRO 500 MG TABS (CIPROFLOXACIN HCL) 1 by mouth two times a day  #10 x 0   Entered and Authorized by:   Loreen Freud DO   Signed by:   Loreen Freud DO on 04/11/2010   Method used:   Electronically to        [facility] [phone]* (retail)        [address]       [place], [place]  [address]       Ph: [phone]       Fax: [phone]   RxID:   [id number]   Laboratory Results   Urine Tests    Routine Urinalysis   Color: yellow Appearance: Clear Glucose: negative   (Normal Range: Negative) Bilirubin: small   (Normal Range: Negative) Ketone: negative   (Normal Range: Negative) Spec. Gravity: 1.015   (Normal Range: 1.003-1.035) Blood: negative   (Normal Range: Negative) pH: 7.0   (Normal Range: 5.0-8.0) Protein: negative   (Normal Range: Negative) Urobilinogen: 0.2   (Normal Range: 0-1) Nitrite: negative   (Normal Range: Negative) Leukocyte Esterace: negative   (Normal Range: Negative)    Comments: Army Fossa CMA  Apr 11, 2010 10:30 AM

## 2010-12-21 NOTE — Progress Notes (Signed)
Summary: PSYCHOLOGY REFERRAL  Phone Note Other Incoming   Summary of Call: FYI FOR CHART.................Marland KitchenREFERENCE PSYCHOLOGY REFERRAL.  PATIENT WAS SCHEDULED WITH KATHRYN TROXLER[facility] ON 07-01-09, AND PER JENNIFER (SCHEDULER FOR Mikayla [name]), THIS PATIENT CANCELLED HER APPOINTMENT & DID NOT RESCHEDULE Magdalen Spatz [facility]  July 22, 2009 4:28 PM   Follow-up for Phone Call        noted; this is a shame,she  would have benefitted from this intervention Follow-up by: Marga Melnick MD,  July 22, 2009 5:21 PM

## 2010-12-21 NOTE — Letter (Signed)
Summary: Results Follow-up Letter  [place] at [facility]  [address] [place], [place] [address]   Phone: [phone]  Fax: [phone]    03/18/2009        [name] NO [address] [place], [place]  [address]  Dear [name],   The following are the results of your recent test(s):  Test     Result     Pap Smear    Normal_______  Not Normal_____       Comments: _________________________________________________________ Cholesterol LDL(Bad cholesterol):          Your goal is less than:         HDL (Good cholesterol):        Your goal is more than: _________________________________________________________ Other Tests:   _________________________________________________________  Please call for an appointment Or __PLEASE SEE ATTACHED LAB RESULTS._______________________________________________________ _________________________________________________________ _________________________________________________________  Sincerely,  Ardyth Man [place] at [facility]

## 2010-12-21 NOTE — Assessment & Plan Note (Signed)
Summary: LEFT EARACHE/CDJ   Vital Signs:  Patient Profile:   45 Years Old Female Height:     62 inches Weight:      237.0 pounds Temp:     97.2 degrees F oral Pulse rate:   74 / minute BP sitting:   100 / 80  (left arm)  Pt. in pain?   no  Vitals Entered By: Jeremy Johann CMA (July 28, 2008 8:26 AM)                  PCP:  Laury Axon  Chief Complaint:  pain in both ear x2d and Ear pain.  History of Present Illness:  Ear Pain      This is a 45 year old woman who presents with Ear pain.  The symptoms began duration 1-2 days ago.  Pt here with c/o b/l earache  L>R.  The patient denies ear discharge, sensation of fullness, hearing loss, tinnitus, fever, sinus pain, nasal discharge, and jaw click.  The pain is located in both ears.  The pain is described as constant.  The patient denies headache, night grinding of teeth, popping or crackling sounds, pressure, toothache, dizziness, and vertigo.         The patient also complains of Chest pain.  The patient reports exertional chest pain and shortness of breath, but denies resting chest pain, nausea, vomiting, diaphoresis, palpitations, dizziness, light headedness, syncope, and indigestion.  The pain is described as intermittent and pressure-like.  The pain is located in the left upper chest.  Episodes of chest pain last < 1 minute.  The pain is brought on or made worse by moderate activity.  The pain is relieved or improved with rest.      Current Allergies (reviewed today): No known allergies   Past Medical History:    Reviewed history from 12/02/2007 and no changes required:       disorder adjustment with anxiety       otitis externa, acute       upper respiratory infection       obesity, nos  Past Surgical History:    Reviewed history from 12/02/2007 and no changes required:       cyst removed left breast       bladder tack       Hysterectomy       Tonsillectomy   Family History:    Reviewed history from 12/02/2007  and no changes required:       mother died 6 of MI triple bypass age 17       father cad       maternal grandmother dm       mother lung cancer       both sides arthritis and depression/alcohol abuse       Family History of CAD Female 1st degree relative <60       Family History of CAD Female 1st degree relative <50  Social History:    Reviewed history from 06/01/2008 and no changes required:       Married       Never Smoked       Alcohol use-no       Drug use-no       Regular exercise-no    Review of Systems      See HPI   Physical Exam  General:     Well-developed,well-nourished,in no acute distress; alert,appropriate and cooperative throughout examination Ears:     L tm errythmatous and canal red  as well Nose:     External nasal examination shows no deformity or inflammation. Nasal mucosa are pink and moist without lesions or exudates. Mouth:     Oral mucosa and oropharynx without lesions or exudates.  Teeth in good repair. Neck:     No deformities, masses, or tenderness noted. Lungs:     Normal respiratory effort, chest expands symmetrically. Lungs are clear to auscultation, no crackles or wheezes. Heart:     normal rate, regular rhythm, and no murmur.   Msk:     No deformity or scoliosis noted of thoracic or lumbar spine.   Extremities:     No clubbing, cyanosis, edema, or deformity noted with normal full range of motion of all joints.   Neurologic:     No cranial nerve deficits noted. Station and gait are normal. Plantar reflexes are down-going bilaterally. DTRs are symmetrical throughout. Sensory, motor and coordinative functions appear intact. Skin:     Intact without suspicious lesions or rashes Cervical Nodes:     No lymphadenopathy noted    Impression & Recommendations:  Problem # 1:  LOM (ICD-382.9) floxin gtts---  pt still has them at home The following medications were removed from the medication list:    Zithromax Z-pak 250 Mg Tabs (Azithromycin)  .Marland Kitchen... 2 by mouth day 1 then 1 by mouth once daily for 4 more days    Augmentin 875-125 Mg Tabs (Amoxicillin-pot clavulanate) .Marland Kitchen... 1 by mouth two times a day  Her updated medication list for this problem includes:    Zithromax Z-pak 250 Mg Tabs (Azithromycin) .Marland Kitchen... 2 by mouth once daily day 1 then 1 by mouth once daily for 4 more days Instructed on prevention and treatment. Call if no improvement in 48-72 hours or sooner if worsening symptoms.  Orders: EKG w/ Interpretation (93[address])   Problem # 2:  CHEST PAIN, INTERMITTENT (ICD-786.50) To ER if returns Orders: Cardio-Pulmonary Stress Test Referral (Cardio-Pulmon) EKG w/ Interpretation (93000)   Complete Medication List: 1)  Zithromax Z-pak 250 Mg Tabs (Azithromycin) .... 2 by mouth once daily day 1 then 1 by mouth once daily for 4 more days    Prescriptions: ZITHROMAX Z-PAK 250 MG TABS (AZITHROMYCIN) 2 by mouth once daily day 1 then 1 by mouth once daily for 4 more days  #1 pack x 0   Entered and Authorized by:   Loreen Freud DO   Signed by:   Loreen Freud DO on 07/28/2008   Method used:   [facility]        [facility] [phone]* (retail)       [address]       [place], [place]  [address]       Ph: [phone]       Fax: [phone]   RxID:   [id number]  ]  EKG  Procedure date:  07/28/2008  Findings:      sinus rhythm 70 bpm   EKG  Procedure date:  07/28/2008  Findings:      sinus rhythm 70 bpm

## 2010-12-21 NOTE — Letter (Signed)
Summary: Primary Care Consult Scheduled Letter  [place] [facility] [place]  [address] [place], [place] [address]   Phone: [phone]  Fax: [phone]      04/13/2010 MRN: [id number]  EARLE BURSON [address] [place], [place]  [address]    Dear Ms. Loredo,    We have scheduled an appointment for you.  At the recommendation of Dr. Loreen Freud, we have scheduled you a consult with Dr. Bjorn Pippin of [facility] on 05-18-2010 arrive by 3:15pm.  Their address is [address], [address], [place] [address]. The office phone number is [phone].  If this appointment day and time is not convenient for you, please feel free to call the office of the doctor you are being referred to at the number listed above and reschedule the appointment.    It is important for you to keep your scheduled appointments. We are here to make sure you are given good patient care.   Thank you,    Renee, Patient Care Coordinator [place] at [facility]

## 2010-12-21 NOTE — Progress Notes (Signed)
Summary: Refill Request  Phone Note Refill Request Call back at [phone] Message from:  Pharmacy on May 19, 2010 8:22 AM  Refills Requested: Medication #1:  LORAZEPAM 0.5 MG TABS 1 by mouth three times a day as needed   Dosage confirmed as above?Dosage Confirmed   Supply Requested: 1 month   [facility]  Next Appointment Scheduled: none Initial call taken by: Lavell Islam,  May 19, 2010 8:24 AM  Follow-up for Phone Call        last ov 05/02/10. Army Fossa CMA  May 19, 2010 8:38 AM it is a little early for refill but will call for 30 tablets, no RF Jose E. Paz MD  May 19, 2010 9:19 AM     Prescriptions: LORAZEPAM 0.5 MG TABS (LORAZEPAM) 1 by mouth three times a day as needed  #30 x 0   Entered by:   Army Fossa CMA   Authorized by:   Nolon Rod. Paz MD   Signed by:   Army Fossa CMA on 05/19/2010   Method used:   Printed then faxed to ...       [facility] [phone][facility] (retail)       [address]       [place], [place]  [address]       Ph: [phone]       Fax: [phone]   RxID:   [id number]

## 2010-12-21 NOTE — Progress Notes (Signed)
Summary: RX---HOP LORAZEPAM  Phone Note Refill Request   Refills Requested: Medication #1:  LORAZEPAM 0.5 MG TABS 1 by mouth three times a day as needed [facility] [phone]  Initial call taken by: Freddy Jaksch,  June 21, 2009 11:08 AM  Follow-up for Phone Call         FILLED 06/15/09 Follow-up by: Kandice Hams,  June 21, 2009 11:26 AM

## 2010-12-21 NOTE — Letter (Signed)
Summary: Results Follow up Letter  [place] [facility] [place]  [address] [place], [place] [address]   Phone: [phone]  Fax: [phone]    02/14/2009 MRN: [id number]  Mikayla [name] [address] [place], [place]  [address]  Dear [name],  The following are the results of your recent test(s):  Test         Result    Pap Smear:        Normal _____  Not Normal _____ Comments: ______________________________________________________ Cholesterol: LDL(Bad cholesterol):         Your goal is less than:         HDL (Good cholesterol):       Your goal is more than: Comments:  ______________________________________________________ Mammogram:        Normal _____  Not Normal _____ Comments:  ___________________________________________________________________ Hemoccult:        Normal _____  Not normal _______ Comments:    _____________________________________________________________________ Other Tests: URINE CULTURE SHOWED BACTERIA CIPRO GIVEN WILL COVER. PLEASE COMPLETE FULL COURSE OF ANTIBIOTICS.    We routinely do not discuss normal results over the telephone.  If you desire a copy of the results, or you have any questions about this information we can discuss them at your next office visit.   Sincerely,

## 2010-12-21 NOTE — Assessment & Plan Note (Signed)
Summary: recheck shingles/cbs   Vital Signs:  Patient profile:   45 year old female Weight:      239 pounds Pulse rate:   82 / minute Pulse rhythm:   regular BP sitting:   126 / 80  (left arm) Cuff size:   large  Vitals Entered By: Army Fossa CMA (August 29, 2010 4:26 PM) CC: Pt here to recheck shingles Comments face still very sore. [facility] [address]   History of Present Illness: here w/ her husband follow up from last office visit a few days ago, she had facial pain and diagnosed with shingles and a OMA Since the last office visit, the initial rash in her scalp is better but she developed blisters in 2 places in her chin, they don't hurt, slightly itchy. She continued to have right facial pain and now also scalp pain. Pain is somehow worse at night, her scalp is so sensitive that even combing her hair make her hurt  ROS Good compliance with Valtrex vision is normal, no eye discharge She continued to have right ear pain, question of discharged last night from the ear.  Current Medications (verified): 1)  Lorazepam 0.5 Mg Tabs (Lorazepam) .Marland Kitchen.. 1 By Mouth Three Times A Day As Needed 2)  Macrobid 100 Mg Caps (Nitrofurantoin Monohyd Macro) .... Two Times A Day 3)  Valtrex 1 Gm Tabs (Valacyclovir Hcl) .... One By Mouth 3 Times A Day For One Week 4)  Amoxicillin 500 Mg Tabs (Amoxicillin) .... 2 By Mouth Two Times A Day 5)  Tylenol With Codeine #3 300-30 Mg Tabs (Acetaminophen-Codeine) .Marland Kitchen.. 1 or By Mouth At Bedtime As Needed Pain  Allergies (verified): No Known Drug Allergies  Past History:  Past Medical History: Reviewed history from 08/01/2010 and no changes required. disorder adjustment with anxiety obesity, nos  Past Surgical History: Reviewed history from 05/02/2010 and no changes required. cyst removed left breast bladder tack, June 2010 Hysterectomy, no oophorectomy Tonsillectomy bladder strap 2010 ant repair vagina 2010  Social  History: Reviewed history from 06/01/2008 and no changes required. Married Never Smoked Alcohol use-no Drug use-no Regular exercise-no  Physical Exam  General:  alert and well-developed.   Head:  face symmetric Eyes:  no redness, external ocular movements intact Ears:  L ear normal.   right tympanic membrane no  bulge, some debris in the ear canal ( white discharge?). Mildly + trago sign  Nose:  note congested Neck:  no lymphadenopathy Skin:  at the right scalp close to the temple : the area of the previous rash now has  4 or 5 dry, scaly places. has 2 dry  groups of vesicles in the chin   Impression & Recommendations:  Problem # 1:  FACIAL PAIN (ICD-784.0) the patient has facial pain and blisters,symptoms consistent with shingles. She also has some degree in the right ear and debri in the canal :Ramsey Hunt Syndrome  Vs otitis media  Plan: steroids neurontin ear drops if no better next week :  referal neurology  Her updated medication list for this problem includes:    Tylenol With Codeine #3 300-30 Mg Tabs (Acetaminophen-codeine) .Marland Kitchen... 1 or by mouth at bedtime as needed pain  Problem # 2:  time spent with the patient and her husband more than 25 minutes going over what shingles is and  discussing plan of care  Complete Medication List: 1)  Lorazepam 0.5 Mg Tabs (Lorazepam) .Marland Kitchen.. 1 by mouth three times a day as needed 2)  Macrobid 100 Mg Caps (Nitrofurantoin monohyd macro) .... Two times a day 3)  Tylenol With Codeine #3 300-30 Mg Tabs (Acetaminophen-codeine) .Marland Kitchen.. 1 or by mouth at bedtime as needed pain 4)  Gabapentin 300 Mg Caps (Gabapentin) .... One by mouth at bedtime for 3 days, then one by mouth twice a day for 3 days, then one by mouth 3 times a day 5)  Prednisone 20 Mg Tabs (Prednisone) .... One by mouth daily for 5 days 6)  Cortisporin-tc 3.01-19-09-0.5 Mg/ml Susp (Neomycin-colist-hc-thonzonium) .... 4 drops in the right ear 3 times a day for one week  Patient  Instructions: 1)  call next week if not improving 2)  Come back in one month for a checkup Prescriptions: LORAZEPAM 0.5 MG TABS (LORAZEPAM) 1 by mouth three times a day as needed  #60 x 0   Entered and Authorized by:   Elita Quick E. Paz MD   Signed by:   Nolon Rod. Paz MD on 08/29/2010   Method used:   Print then Give to Patient   RxID:   [id number] CORTISPORIN-TC 3.01-19-09-0.5 MG/ML SUSP (NEOMYCIN-COLIST-HC-THONZONIUM) 4 drops in the right ear 3 times a day for one week  #1 x 0   Entered and Authorized by:   Nolon Rod. Paz MD   Signed by:   Nolon Rod. Paz MD on 08/29/2010   Method used:   Print then Give to Patient   RxID:   [id number] PREDNISONE 20 MG TABS (PREDNISONE) one by mouth daily for 5 days  #5 x 0   Entered and Authorized by:   Nolon Rod. Paz MD   Signed by:   Nolon Rod. Paz MD on 08/29/2010   Method used:   Print then Give to Patient   RxID:   [id number] GABAPENTIN 300 MG CAPS (GABAPENTIN) one by mouth at bedtime for 3 days, then one by mouth twice a day for 3 days, then one by mouth 3 times a day  #90 x 0   Entered and Authorized by:   Elita Quick E. Paz MD   Signed by:   Nolon Rod. Paz MD on 08/29/2010   Method used:   Print then Give to Patient   RxID:   [id number]

## 2010-12-21 NOTE — Progress Notes (Signed)
Summary: PT NEEDS A PAIN MED-Ultram called in   Phone Note Call from Patient Call back at Work Phone [phone]   Caller: Patient Reason for Call: Refill Medication, Talk to Nurse Summary of Call: DR LOWNE// PT SAYS SHE WAS SEEN YESTURDAY AND DR LOWNE TOLD PT SHE WILL GIVE HER A PAIN MEDICATION AND SHE FORGOT TO GET RX.[facility] Initial call taken by: Job Founds,  July 11, 2007 11:53 AM  Follow-up for Phone Call        ultram 50 1 by mouth every 6 hrs as needed  #30 Follow-up by: Loreen Freud DO,  July 11, 2007 12:30 PM  Additional Follow-up for Phone Call Additional follow up Details #1::        Called pt l/m rx ready for p/u ..................................................................Marland KitchenDaine Gip  July 11, 2007 12:52 PM  Additional Follow-up by: Daine Gip,  July 11, 2007 12:53 PM    New/Updated Medications: ULTRAM 50 MG  TABS (TRAMADOL HCL) take every 6 hrs as needed   Prescriptions: ULTRAM 50 MG  TABS (TRAMADOL HCL) take every 6 hrs as needed  #50 x 0   Entered by:   Daine Gip   Authorized by:   Loreen Freud DO   Signed by:   Daine Gip on 07/11/2007   Method used:   Print then Give to Patient   RxID:   [id number]

## 2010-12-21 NOTE — Assessment & Plan Note (Signed)
Summary: pain on left side--ph   Vital Signs:  Patient Profile:   45 Years Old Female Weight:      238 pounds Temp:     98.1 degrees F oral Resp:     13 per minute BP sitting:   120 / 80  (left arm)  Vitals Entered By: Doristine Devoid (May 25, 2008 2:49 PM)                 PCP:  Laury Axon  Chief Complaint:  pain under left rib sore when moving and tender to touch also has had some swelling in left leg.  History of Present Illness: Present 2-3 days as soreness turning over in bed, also worse with deep inspiration or chest rotation. She was rearranging furniture for past 2 weeks.Edema after driving for periods since 12/08, LLE > RLE.    Current Allergies: No known allergies      Review of Systems  General      Denies chills, fever, sweats, and weight loss.  CV      Denies difficulty breathing at night and difficulty breathing while lying down.  Resp      Complains of chest pain with inspiration and pleuritic.      Denies coughing up blood, shortness of breath, sputum productive, and wheezing.  Derm      Denies changes in color of skin and rash.   Physical Exam  General:     Well-developed,well-nourished,in no acute distress; alert,appropriate and cooperative throughout examination;mildly uncomfortable-appearing.   Chest Wall:     Classic costochondritis L inf rib Lungs:     Normal respiratory effort, chest expands symmetrically. Lungs are clear to auscultation, no crackles or wheezes. Heart:     Normal rate and regular rhythm. S1 and S2 normal without gallop, murmur, click, rub or other extra sounds. Abdomen:     Bowel sounds positive,abdomen soft and non-tender without masses, organomegaly or hernias noted. Extremities:     trace left pedal edema.  No edema RLE. Neg Homan's    Impression & Recommendations:  Problem # 1:  COSTOCHONDRITIS (ICD-733.6)  Problem # 2:  EDEMA- LOCALIZED (ICD-782.3)  Her updated medication list for this problem includes:  Furosemide 40 Mg Tabs (Furosemide) .Marland Kitchen... 1/2 -1 once daily as needed   Complete Medication List: 1)  Zithromax Z-pak 250 Mg Tabs (Azithromycin) .... 2 by mouth day 1 then 1 by mouth once daily for 4 more days 2)  Adipex-p 37.5 Mg Caps (Phentermine hcl) .Marland Kitchen.. 1 by mouth once daily 3)  Augmentin 875-125 Mg Tabs (Amoxicillin-pot clavulanate) .Marland Kitchen.. 1 by mouth two times a day 4)  Ativan 0.5 Mg Tabs (Lorazepam) .Marland Kitchen.. 1 by mouth three times a day as needed 5)  Floxin Otic Singles 0.3 % Soln (Ofloxacin) .Marland Kitchen.. 10 gtts r ear once daily for 7-10 days. 6)  Ultram 50 Mg Tabs (Tramadol hcl) .... Take every 6 hrs as needed 7)  Cortomycin 3.5-10000-1 Susp (Neomycin-polymyxin-hc) .... 3 drops tid 8)  Flexeril 5 Mg Tabs (Cyclobenzaprine hcl) .Marland Kitchen.. 1 at bedtime as needed 9)  Furosemide 40 Mg Tabs (Furosemide) .... 1/2 -1 once daily as needed   Patient Instructions: 1)  No lifting  > 20 # until  pain gone. Heating pad or Zostrix cream as needed. Glucosamine sulfate 1500 mg 4-6  weeks   Prescriptions: FUROSEMIDE 40 MG  TABS (FUROSEMIDE) 1/2 -1 once daily as needed  #30 x 1   Entered and Authorized by:   Marga Melnick MD  Signed by:   Marga Melnick MD on 05/25/2008   Method used:   Electronically sent to ...       [facility] [phone]*       [address]       [place], [place]  [address]       Ph: [phone]       Fax: [phone]   RxID:   [id number] ULTRAM 50 MG  TABS (TRAMADOL HCL) take every 6 hrs as needed  #30 x 1   Entered and Authorized by:   Marga Melnick MD   Signed by:   Marga Melnick MD on 05/25/2008   Method used:   Electronically sent to ...       [facility] [phone]*       [address]       [place], [place]  [address]       Ph: [phone]       Fax: [phone]   RxID:   [id number]  ]

## 2010-12-21 NOTE — Letter (Signed)
Summary: Cancer Screening/[facility] [employer] Personalized Risk Profile  Cancer Screening/[facility] [employer] Personalized Risk Profile   Imported By: Lanelle Bal 05/09/2010 10:51:45  _____________________________________________________________________  External Attachment:    Type:   Image     Comment:   External Document

## 2010-12-21 NOTE — Assessment & Plan Note (Signed)
Summary: CPX AND LABS//VGJ   Vital Signs:  Patient Profile:   45 Years Old Female Height:     62 inches Weight:      237.25 pounds Pulse rate:   70 / minute Resp:     12 per minute BP sitting:   108 / 70  (left arm)  Vitals Entered By: Doristine Devoid (June 01, 2008 8:43 AM)                 PCP:  Laury Axon  Chief Complaint:  cpx and pap.  History of Present Illness: Pt here for CPE and pap and labs.  Pt starting to have aches and pains and swelling in legs and SOB--- symptoms started in December.    Pt saw Dr Alwyn Ren last week for swelling and she was given Lasix 40 mg and swelling is better.      Current Allergies: No known allergies   Past Medical History:    Reviewed history from 12/02/2007 and no changes required:       disorder adjustment with anxiety       otitis externa, acute       upper respiratory infection       obesity, nos  Past Surgical History:    Reviewed history from 12/02/2007 and no changes required:       cyst removed left breast       bladder tack       Hysterectomy       Tonsillectomy   Family History:    Reviewed history from 12/02/2007 and no changes required:       mother died 29 of MI triple bypass age 56       father cad       maternal grandmother dm       mother lung cancer       both sides arthritis and depression/alcohol abuse  Social History:    Reviewed history from 12/02/2007 and no changes required:       Married       Never Smoked       Alcohol use-no       Drug use-no       Regular exercise-no   Risk Factors:  Tobacco use:  never Drug use:  no HIV high-risk behavior:  no Caffeine use:  3 drinks per day Alcohol use:  no Exercise:  no Seatbelt use:  100 %  Family History Risk Factors:    Family History of MI in females < 61 years old:  no    Family History of MI in males < 33 years old:  no   Review of Systems      See HPI  General      Denies chills, fatigue, fever, loss of appetite, malaise, sleep  disorder, sweats, weakness, and weight loss.  Eyes      Denies blurring, discharge, double vision, eye irritation, eye pain, halos, itching, light sensitivity, red eye, vision loss-1 eye, and vision loss-both eyes.      optho--appoint next week  ENT      Denies decreased hearing, difficulty swallowing, ear discharge, earache, hoarseness, nasal congestion, nosebleeds, postnasal drainage, ringing in ears, sinus pressure, and sore throat.      dentist q48m  CV      Denies bluish discoloration of lips or nails, chest pain or discomfort, difficulty breathing at night, difficulty breathing while lying down, fainting, fatigue, leg cramps with exertion, lightheadness, near fainting, palpitations, shortness of breath with exertion, swelling  of feet, swelling of hands, and weight gain.  Resp      Denies chest discomfort, chest pain with inspiration, cough, coughing up blood, excessive snoring, hypersomnolence, morning headaches, pleuritic, shortness of breath, sputum productive, and wheezing.  GI      Denies abdominal pain, bloody stools, change in bowel habits, constipation, dark tarry stools, diarrhea, excessive appetite, gas, hemorrhoids, indigestion, loss of appetite, nausea, vomiting, vomiting blood, and yellowish skin color.  GU      Denies abnormal vaginal bleeding, decreased libido, discharge, dysuria, genital sores, hematuria, incontinence, nocturia, urinary frequency, and urinary hesitancy.  MS      Denies joint pain, joint redness, joint swelling, loss of strength, low back pain, mid back pain, muscle aches, muscle , cramps, muscle weakness, stiffness, and thoracic pain.  Derm      Denies changes in color of skin, changes in nail beds, dryness, excessive perspiration, flushing, hair loss, insect bite(s), itching, lesion(s), poor wound healing, and rash.  Neuro      Denies brief paralysis, difficulty with concentration, disturbances in coordination, falling down, headaches, inability to  speak, memory loss, numbness, poor balance, seizures, sensation of room spinning, tingling, tremors, visual disturbances, and weakness.  Psych      Denies alternate hallucination ( auditory/visual), anxiety, depression, easily angered, easily tearful, irritability, mental problems, panic attacks, sense of great danger, suicidal thoughts/plans, thoughts of violence, unusual visions or sounds, and thoughts /plans of harming others.  Endo      Denies cold intolerance, excessive hunger, excessive thirst, excessive urination, heat intolerance, polyuria, and weight change.  Heme      Denies abnormal bruising, bleeding, enlarge lymph nodes, fevers, pallor, and skin discoloration.  Allergy      Denies hives or rash, itching eyes, persistent infections, seasonal allergies, and sneezing.   Physical Exam  General:     Well-developed,well-nourished,in no acute distress; alert,appropriate and cooperative throughout examination Head:     Normocephalic and atraumatic without obvious abnormalities. No apparent alopecia or balding. Eyes:     vision grossly intact, pupils equal, pupils round, pupils reactive to light, and no injection.   Ears:     External ear exam shows no significant lesions or deformities.  Otoscopic examination reveals clear canals, tympanic membranes are intact bilaterally without bulging, retraction, inflammation or discharge. Hearing is grossly normal bilaterally. Nose:     External nasal examination shows no deformity or inflammation. Nasal mucosa are pink and moist without lesions or exudates. Mouth:     Oral mucosa and oropharynx without lesions or exudates.  Teeth in good repair. Neck:     No deformities, masses, or tenderness noted. Chest Wall:     No deformities, masses, or tenderness noted. Breasts:     No mass, nodules, thickening, tenderness, bulging, retraction, inflamation, nipple discharge or skin changes noted.   Lungs:     Normal respiratory effort, chest  expands symmetrically. Lungs are clear to auscultation, no crackles or wheezes. Heart:     normal rate, regular rhythm, and no murmur.   Abdomen:     Bowel sounds positive,abdomen soft and non-tender without masses, organomegaly or hernias noted. Rectal:     No external abnormalities noted. Normal sphincter tone. No rectal masses or tenderness. Genitalia:     Pelvic Exam:        External: normal female genitalia without lesions or masses        Vagina: normal without lesions or masses        Cervix: normal without lesions  or masses        Adnexa: normal bimanual exam without masses or fullness        Uterus: normal by palpation        Pap smear: performed Msk:     normal ROM, no joint tenderness, no joint swelling, no joint warmth, no redness over joints, no joint deformities, no joint instability, and no crepitation.   Pulses:     R posterior tibial normal, R dorsalis pedis normal, R carotid normal, L posterior tibial normal, L dorsalis pedis normal, and L carotid normal.   Extremities:     No clubbing, cyanosis, edema, or deformity noted with normal full range of motion of all joints.   Neurologic:     No cranial nerve deficits noted. Station and gait are normal. Plantar reflexes are down-going bilaterally. DTRs are symmetrical throughout. Sensory, motor and coordinative functions appear intact. Skin:     Intact without suspicious lesions or rashes Cervical Nodes:     No lymphadenopathy noted Axillary Nodes:     No palpable lymphadenopathy Psych:     Cognition and judgment appear intact. Alert and cooperative with normal attention span and concentration. No apparent delusions, illusions, hallucinations    Impression & Recommendations:  Problem # 1:  PREVENTIVE HEALTH CARE (ICD-V70.0) GHM utd  Orders: Venipuncture ([address]) TLB-Lipid Panel (80061-LIPID) TLB-BMP (Basic Metabolic Panel-BMET) (80048-METABOL) TLB-CBC Platelet - w/Differential (85025-CBCD) TLB-Hepatic/Liver  Function Pnl (80076-HEPATIC) TLB-TSH (Thyroid Stimulating Hormone) (84443-TSH) EKG w/ Interpretation (93000)   Problem # 2:  EDEMA- LOCALIZED (ICD-782.3)  Her updated medication list for this problem includes:    Furosemide 40 Mg Tabs (Furosemide) .Marland Kitchen... 1/2 -1 once daily as needed  Orders: Venipuncture ([address]) TLB-Lipid Panel (80061-LIPID) TLB-BMP (Basic Metabolic Panel-BMET) (80048-METABOL) TLB-CBC Platelet - w/Differential (85025-CBCD) TLB-Hepatic/Liver Function Pnl (80076-HEPATIC) TLB-TSH (Thyroid Stimulating Hormone) (84443-TSH) EKG w/ Interpretation (93000) Discussed elevation of the legs, use of compression stockings, sodium restiction, and medication use.   Problem # 3:  OBESITY NOS (ICD-278.00) D/W Pt [employer] or Flat Belly diet as well as Optifast. Orders: Venipuncture (09811) TLB-Lipid Panel (80061-LIPID) TLB-BMP (Basic Metabolic Panel-BMET) (80048-METABOL) TLB-CBC Platelet - w/Differential (85025-CBCD) TLB-Hepatic/Liver Function Pnl (80076-HEPATIC) TLB-TSH (Thyroid Stimulating Hormone) (84443-TSH) EKG w/ Interpretation (93000)   Complete Medication List: 1)  Zithromax Z-pak 250 Mg Tabs (Azithromycin) .... 2 by mouth day 1 then 1 by mouth once daily for 4 more days 2)  Adipex-p 37.5 Mg Caps (Phentermine hcl) .Marland Kitchen.. 1 by mouth once daily 3)  Augmentin 875-125 Mg Tabs (Amoxicillin-pot clavulanate) .Marland Kitchen.. 1 by mouth two times a day 4)  Ativan 0.5 Mg Tabs (Lorazepam) .Marland Kitchen.. 1 by mouth three times a day as needed 5)  Floxin Otic Singles 0.3 % Soln (Ofloxacin) .Marland Kitchen.. 10 gtts r ear once daily for 7-10 days. 6)  Ultram 50 Mg Tabs (Tramadol hcl) .... Take every 6 hrs as needed 7)  Cortomycin 3.5-10000-1 Susp (Neomycin-polymyxin-hc) .... 3 drops tid 8)  Flexeril 5 Mg Tabs (Cyclobenzaprine hcl) .Marland Kitchen.. 1 at bedtime as needed 9)  Furosemide 40 Mg Tabs (Furosemide) .... 1/2 -1 once daily as needed    ]  EKG  Procedure date:  06/01/2008  Findings:      sinus rhythm  78  bpm   EKG  Procedure date:  06/01/2008  Findings:      sinus rhythm  78 bpm

## 2010-12-21 NOTE — Assessment & Plan Note (Signed)
Summary: frequent ua, burning/nta   Vital Signs:  Patient profile:   45 year old female Height:      62 inches Weight:      240 pounds Temp:     97.1 degrees F BP sitting:   110 / 70  Vitals Entered By: Shary Decamp (November 15, 2009 4:01 PM) CC: acute only Is Patient Diabetic? No Comments  - rt low back pain -- radiates to abd x 1 week  - feels "irritated" when she urinates Shary Decamp  November 15, 2009 4:01 PM    History of Present Illness: two days  history of dysuria, and nocturnal frequency mild right flank pain, pain is on and  off  Current Medications (verified): 1)  Lorazepam 0.5 Mg Tabs (Lorazepam) .Marland Kitchen.. 1 By Mouth Three Times A Day As Needed  Allergies (verified): No Known Drug Allergies  Past History:  Past Medical History: Reviewed history from 12/02/2007 and no changes required. disorder adjustment with anxiety otitis externa, acute upper respiratory infection obesity, nos  Past Surgical History: Reviewed history from 12/02/2007 and no changes required. cyst removed left breast bladder tack Hysterectomy Tonsillectomy  Social History: Reviewed history from 06/01/2008 and no changes required. Married Never Smoked Alcohol use-no Drug use-no Regular exercise-no  Review of Systems General:  Denies chills and fever. GI:  Denies vomiting; minimal nausea . GU:  Denies hematuria; no vag d/c . Derm:  no rash on her flank.  Physical Exam  General:  alert and well-developed.  no apparent distress Abdomen:  soft, no distention, no masses, no guarding, and no rigidity.  mild tenderness at the base of the right flank, question of right CVA tenderness.  No mass or rebound anywhere in the abdomen   Impression & Recommendations:  Problem # 1:  UTI (ICD-599.0)  symptoms likely from a UTI, advise patient to call or go to the ER if severe pain.  If that is the case she may need a CT to rule out kidney stones Orders: UA Dipstick w/o Micro (manual)  ([address]) Specimen Handling (60[address]) T-Culture, Urine (09811-91478) T-Urine Microscopic (29562-13086)  Her updated medication list for this problem includes:    Ciprofloxacin Hcl 500 Mg Tabs (Ciprofloxacin hcl) ..... One twice a day by mouth for one week  Complete Medication List: 1)  Lorazepam 0.5 Mg Tabs (Lorazepam) .Marland Kitchen.. 1 by mouth three times a day as needed 2)  Ciprofloxacin Hcl 500 Mg Tabs (Ciprofloxacin hcl) .... One twice a day by mouth for one week  Patient Instructions: 1)  drink plenty of fluids. 2)  Tylenol for pain. 3)  Ciprofloxacin for one week. 4)  Call if not better. 5)  Call anytime if the symptoms are severe or you have fever or if you are not completely well in two weeks Prescriptions: CIPROFLOXACIN HCL 500 MG TABS (CIPROFLOXACIN HCL) one twice a day by mouth for one week  #14 x 0   Entered and Authorized by:   Nolon Rod. Paz MD   Signed by:   Nolon Rod. Paz MD on 11/15/2009   Method used:   [facility]        [facility] [phone][facility] (retail)       [address]       [place], [place]  [address]       Ph: [phone]       Fax: [phone]   RxID:   [id number]   Laboratory Results   Urine  Tests    Routine Urinalysis   Glucose: negative   (Normal Range: Negative) Bilirubin: negative   (Normal Range: Negative) Ketone: negative   (Normal Range: Negative) Spec. Gravity: 1.020   (Normal Range: 1.003-1.035) Blood: small   (Normal Range: Negative) pH: 6.5   (Normal Range: 5.0-8.0) Protein: negative   (Normal Range: Negative) Urobilinogen: 0.2   (Normal Range: 0-1) Nitrite: negative   (Normal Range: Negative) Leukocyte Esterace: large   (Normal Range: Negative)

## 2010-12-21 NOTE — Progress Notes (Signed)
Summary: earached  Phone Note Call from Patient Call back at Home Phone [phone]   Summary of Call: pt is wanting to know if ear drops can be called in for her for an earache. [facility] [address] Initial call taken by: Lavell Islam,  August 22, 2010 12:30 PM  Follow-up for Phone Call        patient called back - discussed with kim - i offered patient appt tomorrow she couldnt come in the time i offered - per kims instruction patient can go to urgent care - patient would need appt before rx called in .Marland KitchenOkey Regal [place]  August 22, 2010 1:57 PM   Additional Follow-up for Phone Call Additional follow up Details #1::        Trie calling pt to check her status. Left message to call back.     Almeta Monas CMA Duncan Dull)  August 22, 2010 5:01 PM      Additional Follow-up for Phone Call Additional follow up Details #2::    seen here today Follow-up by: [facility] E. Paz MD,  August 23, 2010 6:36 PM

## 2010-12-21 NOTE — Assessment & Plan Note (Signed)
Summary: ear ache /rsch from yesterday Dr. Blossom Hoops   Vital Signs:  Patient Profile:   45 Years Old Female Weight:      214.2 pounds Temp:     98.3 degrees F oral BP sitting:   104 / 72  (left arm) Cuff size:   regular               PCP:  [facility]  Chief Complaint:  RIGHT EAC ACHE X 3 DAYS and Ear pain.  History of Present Illness: Pt here c/o earache for 3 days.   Ear Pain      This is a 45 year old woman who presents with Ear pain.  The symptoms began duration > 3 days ago.  The patient denies sensation of fullness.  The pain is located in the right ear.  The pain is described as constant.  The patient denies headache, night grinding of teeth, popping or crackling sounds, pressure, toothache, dizziness, and vertigo.    Pt also under a lot of stress right now and needs something to help her relax.  Mother in law in dying.  she is also here to f/u adipex.  Pt with no complaints with diet and exercise.  Doing well  Current Allergies: No known allergies      Review of Systems      See HPI   Physical Exam  General:     Well-developed,well-nourished,in no acute distress; alert,appropriate and cooperative throughout examination Ears:     R ear canal swollen and red Nose:     External nasal examination shows no deformity or inflammation. Nasal mucosa are pink and moist without lesions or exudates. Lungs:     Normal respiratory effort, chest expands symmetrically. Lungs are clear to auscultation, no crackles or wheezes. Heart:     Normal rate and regular rhythm. S1 and S2 normal without gallop, murmur, click, rub or other extra sounds. Extremities:     No clubbing, cyanosis, edema, or deformity noted with normal full range of motion of all joints.      Impression & Recommendations:  Problem # 1:  OTITIS EXTERNA, ACUTE NEC (ICD-380.22) Assessment: New floxin otic 10 gtts R ear q day for 7- 10 days Augmentin 875 two times a day for 10 days  rto if no  better    Problem # 2:  OBESITY NOS (ICD-278.00) Assessment: Improved refill adipex rto 1 month  Problem # 3:  DISORDER, ADJUSTMENT W/ANXIETY (ICD-309.24) Assessment: New ativan 0.5 mg three times a day as needed  rto prn  Complete Medication List: 1)  Zithromax Z-pak 250 Mg Tabs (Azithromycin) .... 2 by mouth day 1 then 1 by mouth once daily for 4 more days 2)  Adipex-p 37.5 Mg Caps (Phentermine hcl) .Marland Kitchen.. 1 by mouth once daily 3)  Augmentin 875-125 Mg Tabs (Amoxicillin-pot clavulanate) .Marland Kitchen.. 1 by mouth two times a day 4)  Ativan 0.5 Mg Tabs (Lorazepam) .Marland Kitchen.. 1 by mouth three times a day as needed 5)  Floxin Otic Singles 0.3 % Soln (Ofloxacin) .Marland Kitchen.. 10 gtts r ear once daily for 7-10 days.     Prescriptions: FLOXIN OTIC SINGLES 0.3 %  SOLN (OFLOXACIN) 10 gtts R ear once daily for 7-10 days.  #10 days x 0   Entered and Authorized by:   Loreen Freud DO   Signed by:   Loreen Freud DO on 07/10/2007   Method used:   Print then Give to Patient   RxID:   [id number] ADIPEX-P 37.5 MG  CAPS (PHENTERMINE HCL) 1 by mouth once daily  #30 x 0   Entered and Authorized by:   Loreen Freud DO   Signed by:   Loreen Freud DO on 07/10/2007   Method used:   Print then Give to Patient   RxID:   [id number] ATIVAN 0.5 MG  TABS (LORAZEPAM) 1 by mouth three times a day as needed  #30 x 0   Entered and Authorized by:   Loreen Freud DO   Signed by:   Loreen Freud DO on 07/10/2007   Method used:   Print then Give to Patient   RxID:   [id number] AUGMENTIN 875-125 MG  TABS (AMOXICILLIN-POT CLAVULANATE) 1 by mouth two times a day  #20 x 0   Entered and Authorized by:   Loreen Freud DO   Signed by:   Loreen Freud DO on 07/10/2007   Method used:   Print then Give to Patient   RxID:   [id number]

## 2010-12-21 NOTE — Progress Notes (Signed)
Summary: [facility]--rx lorazepam  Phone Note Refill Request   Refills Requested: Medication #1:  LORAZEPAM 0.5 MG TABS 1 by mouth three times a day as needed [facility] [address][phone]  Initial call taken by: Freddy Jaksch,  December 29, 2008 9:07 AM  Follow-up for Phone Call        LAST OV 12/28/08, LAST REFILL #30 X 0 ON 10/18/08 Follow-up by: Kandice Hams,  December 29, 2008 12:25 PM  Additional Follow-up for Phone Call Additional follow up Details #1::        ok to refill 2 Additional Follow-up by: Loreen Freud DO,  December 29, 2008 12:34 PM      Prescriptions: LORAZEPAM 0.5 MG TABS (LORAZEPAM) 1 by mouth three times a day as needed  #30 x 2   Entered by:   Kandice Hams   Authorized by:   Loreen Freud DO   Signed by:   Kandice Hams on 12/30/2008   Method used:   Printed then faxed to ...       [facility] [phone]* (retail)       [address]       [place], [place]  [address]       Ph: [phone]       Fax: [phone]   RxID:   [id number]

## 2010-12-21 NOTE — Letter (Signed)
Summary: Results Follow-up Letter  [facility]  [address] [place], [place] [address]   Phone: [phone]  Fax: [phone]    04/19/2009        Mikayla [name] [address] [place], [place]  [address]  Dear Ms. Zupko,   The following are the results of your recent test(s):  Test     Result     Pap Smear    Normal_______  Not Normal_____       Comments: _________________________________________________________ Cholesterol LDL(Bad cholesterol):          Your goal is less than:         HDL (Good cholesterol):        Your goal is more than: _________________________________________________________ Other Tests:   _________________________________________________________  Please call for an appointment Or __Please see attached labwork and new medication._______________________________________________________ _________________________________________________________ _________________________________________________________  Sincerely,  Ardyth Man [place] at [facility]

## 2010-12-21 NOTE — Progress Notes (Signed)
Summary: lab results   Phone Note Outgoing Call   Summary of Call: urine culture came back positive. call Bactrim DS one p.o. b.i.d. for one week, further treatment/evaluation per PCP, this is an ongoing problem Also advise patient,  her vitamins are within normal Jose E. Paz MD  September 20, 2010 2:34 PM   Follow-up for Phone Call        Left message for pt to call back. Army Fossa CMA  September 20, 2010 2:41 PM   Additional Follow-up for Phone Call Additional follow up Details #1::        Spoke with pt she is aware of results. Additional Follow-up by: Army Fossa CMA,  September 20, 2010 3:35 PM    New/Updated Medications: BACTRIM DS 800-160 MG TABS (SULFAMETHOXAZOLE-TRIMETHOPRIM) 1 by mouth two times a day for 1 week. Prescriptions: BACTRIM DS 800-160 MG TABS (SULFAMETHOXAZOLE-TRIMETHOPRIM) 1 by mouth two times a day for 1 week.  #14 x 0   Entered by:   Army Fossa CMA   Authorized by:   Nolon Rod. Paz MD   Signed by:   Army Fossa CMA on 09/20/2010   Method used:   Electronically to        [facility] [phone]* (retail)       [address]       [place], [place]  [address]       Ph: [phone]       Fax: [phone]   RxID:   [id number]

## 2010-12-21 NOTE — Letter (Signed)
Summary: [facility]  [facility]   Imported By: Lanelle Bal 08/31/2010 09:49:16  _____________________________________________________________________  External Attachment:    Type:   Image     Comment:   External Document

## 2010-12-21 NOTE — Progress Notes (Signed)
Summary: [facility]--refill  Phone Note Refill Request   Refills Requested: Medication #1:  lorazepam 0.5 mg #30  Medication #2:  lorazepam 1 mg #30 [facility] [address] --[phone]  Initial call taken by: Freddy Jaksch,  December 18, 2007 4:55 PM      Prescriptions: ATIVAN 0.5 MG  TABS (LORAZEPAM) 1 by mouth three times a day as needed  #30 x 0   Entered by:   Doristine Devoid   Authorized by:   Loreen Freud DO   Signed by:   Doristine Devoid on 12/19/2007   Method used:   Printed then faxed to ...       [facility] [phone]*       [address]       [place], [place]  [address]       Ph: [phone]       Fax: [phone]   RxID:   [id number]

## 2010-12-21 NOTE — Progress Notes (Signed)
  Phone Note Call from Patient   Summary of Call: Spoke with pt regarding Vitamin D she understands, medicaiton called in.     New/Updated Medications: VITAMIN D3 2000 UNIT CAPS (CHOLECALCIFEROL) 1 by mouth daily. Prescriptions: VITAMIN D (ERGOCALCIFEROL) 50000 UNIT CAPS (ERGOCALCIFEROL) Take one tablet weekly.  #4 x 2   Entered by:   Army Fossa CMA   Authorized by:   Loreen Freud DO   Signed by:   Army Fossa CMA on 08/31/2009   Method used:   Electronically to        [facility] [phone]* (retail)       [address]       [place], [place]  [address]       Ph: [phone]       Fax: [phone]   RxID:   [id number]

## 2010-12-21 NOTE — Progress Notes (Signed)
Summary: REFILL LORAZEPAM//HOP  Phone Note Refill Request Message from:  Pharmacy on [facility] FAX [phone]  Refills Requested: Medication #1:  LORAZEPAM 0.5 MG TABS 1 by mouth three times a day as needed Initial call taken by: Barb Merino,  July 19, 2009 10:10 AM  Follow-up for Phone Call        LAST OV WITH YOU 01/21/09, LAST REFILL #30 X 2 ON 06/16/09 Follow-up by: Kandice Hams,  July 20, 2009 2:21 PM  Additional Follow-up for Phone Call Additional follow up Details #1::        OK X 1 Additional Follow-up by: Marga Melnick MD,  July 20, 2009 4:40 PM    Prescriptions: LORAZEPAM 0.5 MG TABS (LORAZEPAM) 1 by mouth three times a day as needed  #30 x 1   Entered by:   Shonna Chock   Authorized by:   Marga Melnick MD   Signed by:   Shonna Chock on 07/21/2009   Method used:   Printed then faxed to ...       [facility] [phone]* (retail)       [address]       [place], [place]  [address]       Ph: [phone]       Fax: [phone]   RxID:   [id number]

## 2010-12-21 NOTE — Progress Notes (Signed)
Summary: REQUEST FOR REFERRAL  Phone Note Call from Patient   Caller: Patient Summary of Call: PATIENT WANTS A REFERRAL TO [facility] #[phone]. SHE STATED THAT SHE CANT MAKE THE APPT. OUR OFFICE HAS TO.CONTACT [phone] Initial call taken by: Barb Merino,  April 21, 2009 10:23 AM  Follow-up for Phone Call        SPOKE WITH PATIENT TO VERIFY REASON FOR REFERRAL-PATIENT STATED [facility] OFF[facility] HAS A NEW PROGRAM FOR WEIGHT LOSS AND SHE WOULD LIKE TO BE REFERRED. Follow-up by: Shonna Chock,  April 22, 2009 8:54 AM

## 2010-12-21 NOTE — Letter (Signed)
Summary: [place] No Show Letter  [facility] [place]  [address] [place], [place] [address]   Phone: [phone]  Fax: [phone]    12/30/2008 MRN: [id number]  ONEY TATLOCK [address] [place], [place]  [address]   Dear Ms. Pawley,   Our records indicate that you missed your scheduled appointment with ____lab_________________ on __02/11/2010__________.  Please contact this office to reschedule your appointment as soon as possible.  It is important that you keep your scheduled appointments with your physician, so we can provide you the best care possible.  Please be advised that there may be a charge for "no show" appointments.       Sincerely,  Regina/Lab   [place] [facility]

## 2010-12-21 NOTE — Progress Notes (Signed)
Summary: refill  Phone Note Refill Request Message from:  Fax from Pharmacy on [address] fax [phone]  Refills Requested: Medication #1:  LORAZEPAM 0.5 MG TABS 1 by mouth three times a day as needed   Last Refilled: 10/17/2009   Notes: last ov- 09/12/09 was given 1 refill on 10/17/09 Initial call taken by: Barb Merino,  November 10, 2009 8:53 AM  Follow-up for Phone Call        OK X 1 Follow-up by: Marga Melnick MD,  November 10, 2009 12:29 PM    Prescriptions: LORAZEPAM 0.5 MG TABS (LORAZEPAM) 1 by mouth three times a day as needed  #30 x 0   Entered by:   Army Fossa CMA   Authorized by:   Marga Melnick MD   Signed by:   Army Fossa CMA on 11/10/2009   Method used:   Printed then faxed to ...       [facility] [phone]* (retail)       [address]       [place], [place]  [address]       Ph: [phone]       Fax: [phone]   RxID:   [id number] LORAZEPAM 0.5 MG TABS (LORAZEPAM) 1 by mouth three times a day as needed  #30 x 1   Entered by:   Army Fossa CMA   Authorized by:   Marga Melnick MD   Signed by:   Army Fossa CMA on 11/10/2009   Method used:   Printed then faxed to ...       [facility] [phone]* (retail)       [address]       [place], [place]  [address]       Ph: [phone]       Fax: [phone]   RxID:   [id number]

## 2010-12-21 NOTE — Progress Notes (Signed)
Summary: Ongoing Facial Pain  Phone Note Call from Patient Call back at Work Phone [phone]   Caller: Patient Summary of Call: Message left on Triage Voicemail: Patient with ongoin facial pain from ov on 08/23/10, patient not sure if something else can be done of if she needs to come back in  Please advise Shonna Chock CMA  August 28, 2010 2:23 PM   Follow-up for Phone Call        please bring her back tomorrow for a recheck. Overbook OK Follow-up by: Nolon Rod. Paz MD,  August 28, 2010 5:02 PM  Additional Follow-up for Phone Call Additional follow up Details #1::        Pt has appt 08/29/10 @ 4pm.  Additional Follow-up by: Army Fossa CMA,  August 29, 2010 7:57 AM

## 2010-12-21 NOTE — Progress Notes (Signed)
Summary: lab results/med refill/lmom 10/7  Phone Note Refill Request Message from:  [facility]  Refills Requested: Medication #1:  tussionex pennkinetic susp 1 teaspoonful po at bedtime last filled 12-28-08 #6oz last ov 06-23-09........Marland KitchenFelecia Deloach CMA  August 25, 2009 3:02 PM  informed pt that this is not a maintenance med and that if she is symptomatic she needs to come in for OV. Offer to schedule appt pt decline stating that she has to check schedule and give [place] a call back...................Marland KitchenFelecia Deloach CMA  August 25, 2009 3:04 PM   Initial call taken by: Jeremy Johann CMA,  August 25, 2009 3:02 PM  Follow-up for Phone Call        dr lowne pls advise  pt would like lab results. pt states that she left VM yesterday in regards to vit-d level and if she stills needs to take med. pt states that she has yet to hear anything back.............Marland KitchenFelecia Deloach CMA  August 25, 2009 3:07 PM   Additional Follow-up for Phone Call Additional follow up Details #1::        see labs Additional Follow-up by: Loreen Freud DO,  August 25, 2009 3:21 PM    Additional Follow-up for Phone Call Additional follow up Details #2::    level is still low---vita D  50000u weekly and take otc vita D3  2000 u daily----recheck 3 months.  Marland KitchenMarland KitchenMarland KitchenLeft message to call back.  Follow-up by: Army Fossa CMA,  August 25, 2009 3:35 PM  Additional Follow-up for Phone Call Additional follow up Details #3:: Details for Additional Follow-up Action Taken: [facility], also mailed pt a letter to contact the office and labs.  Additional Follow-up by: Army Fossa CMA,  August 29, 2009 2:57 PM

## 2010-12-21 NOTE — Progress Notes (Signed)
Summary: Refill Request  Phone Note Refill Request Message from:  [facility] Fax #: [phone]  Refills Requested: Medication #1:  LORAZEPAM 0.5 MG TABS 1 by mouth three times a day as needed   Dosage confirmed as above?Dosage Confirmed   Supply Requested: 1 month   Last Refilled: 12/31/2009 Next Appointment Scheduled: none Initial call taken by: Harold Barban,  February 27, 2010 8:42 AM    Prescriptions: LORAZEPAM 0.5 MG TABS (LORAZEPAM) 1 by mouth three times a day as needed  #30 x 0   Entered by:   Shonna Chock   Authorized by:   Marga Melnick MD   Signed by:   Shonna Chock on 02/28/2010   Method used:   Printed then faxed to ...       [facility] [phone]* (retail)       [address]       [place], [place]  [address]       Ph: [phone]       Fax: [phone]   RxID:   [id number]

## 2010-12-21 NOTE — Consult Note (Signed)
[facility]: [facility]   Imported By: Lanelle Bal 05/31/2009 10:10:18  _____________________________________________________________________  External Attachment:    Type:   Image     Comment:   External Document

## 2010-12-21 NOTE — Assessment & Plan Note (Signed)
Summary: urinary frequency/cdj   Vital Signs:  Patient profile:   44 year old female Height:      62 inches Weight:      234.4 pounds Temp:     99 degrees F BP sitting:   110 / 80  Vitals Entered By: Shary Decamp (November 30, 2009 10:28 AM) CC: UTI sxs Comments  - sxs never completely resolved with cipro  - odor, back pain, cloudy brown urine Shary Decamp  November 30, 2009 10:29 AM    History of Present Illness: status-post Cipro for a documented UTI. The symptoms never went away 100  percent,   still having discomfort with urination, sometimes suprapubic pain. Also has on-and-off right flank pain, it can be intense at times and " different  from a muscle pain"  Allergies: No Known Drug Allergies  Past History:  Past Medical History: Reviewed history from 12/02/2007 and no changes required. disorder adjustment with anxiety otitis externa, acute upper respiratory infection obesity, nos  Past Surgical History: cyst removed left breast bladder tack, June 2010 Hysterectomy, no oophorectomy Tonsillectomy  Social History: Reviewed history from 06/01/2008 and no changes required. Married Never Smoked Alcohol use-no Drug use-no Regular exercise-no  Review of Systems       denies fever (+) frecuency denies any vulvar itching, rash.  No vaginal discharge no gross hematuria denies inguinal mass or hernias  Physical Exam  General:  alert, well-developed, and well-nourished.   Lungs:  normal respiratory effort, no intercostal retractions, no accessory muscle use, and normal breath sounds.   Heart:  normal rate, regular rhythm, no murmur, and no gallop.   Abdomen:  soft, no distention, no masses, no guarding, and no rigidity.  slightly tender the lower, left abdomen. no CVA tenderness Msk:  no tenderness to palpation of the spine Extremities:  no edema   Impression & Recommendations:  Problem # 1:  UTI (ICD-599.0) persistent symptoms despite appropriate  antibiotic treatment Plan: Retreat with antibiotics renal  ultrasound to rule out a stone and post voiding  urinary retention if no better, will ask urology to re evaluate The following medications were removed from the medication list:    Ciprofloxacin Hcl 500 Mg Tabs (Ciprofloxacin hcl) ..... One twice a day by mouth for one week Her updated medication list for this problem includes:    Bactrim Ds 800-160 Mg Tabs (Sulfamethoxazole-trimethoprim) ..... One twice a day for 10 days  Orders: UA Dipstick w/o Micro (manual) (04540) T-Urine Microscopic ([address]8119-14782) T-Culture, Urine ([address]21-30865) Radiology Referral (Radiology)  Complete Medication List: 1)  Lorazepam 0.5 Mg Tabs (Lorazepam) .Marland Kitchen.. 1 by mouth three times a day as needed 2)  Bactrim Ds 800-160 Mg Tabs (Sulfamethoxazole-trimethoprim) .... One twice a day for 10 days 3)  Fluconazole 150 Mg Tabs (Fluconazole) .Marland Kitchen.. 1 a day x 2 days  Patient Instructions: 1)  drink plenty of fluids 2)  Bactrim DS as prescribed, avoid sun exposure 3)  call if not better next week, he will need to see the urologist again Prescriptions: FLUCONAZOLE 150 MG TABS (FLUCONAZOLE) 1 a day x 2 days  #2 x 0   Entered and Authorized by:   Nolon Rod. Paz MD   Signed by:   Nolon Rod. Paz MD on 11/30/2009   Method used:   Electronically to        [facility] [phone]* (retail)       [address] [facility]       [place]  [place], [place]  [address]       Ph: [phone]       Fax: [phone]   RxID:   [id number] BACTRIM DS 800-160 MG TABS (SULFAMETHOXAZOLE-TRIMETHOPRIM) one twice a day for 10 days  #20 x 0   Entered and Authorized by:   Nolon Rod. Paz MD   Signed by:   Nolon Rod. Paz MD on 11/30/2009   Method used:   Electronically to        CVS  [facility] [phone]* (retail)       [address]       [place], [place]  [address]       Ph: [phone]       Fax: [phone]   RxID:   [id number]   Laboratory  Results   Urine Tests    Routine Urinalysis   Glucose: negative   (Normal Range: Negative) Bilirubin: negative   (Normal Range: Negative) Ketone: negative   (Normal Range: Negative) Spec. Gravity: 1.015   (Normal Range: 1.003-1.035) Blood: negative   (Normal Range: Negative) pH: 5.0   (Normal Range: 5.0-8.0) Protein: negative   (Normal Range: Negative) Urobilinogen: 0.2   (Normal Range: 0-1) Nitrite: negative   (Normal Range: Negative) Leukocyte Esterace: trace   (Normal Range: Negative)

## 2010-12-21 NOTE — Progress Notes (Signed)
Summary: refill LORAZEPAM//HOP  Phone Note Refill Request Message from:  Pharmacy on [facility] [address] fax [phone]  Refills Requested: Medication #1:  LORAZEPAM 0.5 MG TABS 1 by mouth three times a day as needed Initial call taken by: Barb Merino,  June 15, 2009 2:57 PM  Follow-up for Phone Call        LAST OV 04/06/09, LAST REFILL #30 X 2 ON 12/30/08 Follow-up by: Kandice Hams,  June 16, 2009 10:41 AM  Additional Follow-up for Phone Call Additional follow up Details #1::        OK X 1 with 2 refills Additional Follow-up by: Marga Melnick MD,  June 16, 2009 11:38 AM    Prescriptions: LORAZEPAM 0.5 MG TABS (LORAZEPAM) 1 by mouth three times a day as needed  #30 x 2   Entered by:   Kandice Hams   Authorized by:   Marga Melnick MD   Signed by:   Kandice Hams on 06/16/2009   Method used:   Printed then faxed to ...       [facility] [phone]* (retail)       [address]       [place], [place]  [address]       Ph: [phone]       Fax: [phone]   RxID:   [id number]

## 2010-12-21 NOTE — Progress Notes (Signed)
Summary: apt  Phone Note Call from Patient   Caller: Patient Reason for Call: Acute Illness Summary of Call: dr. Laury Axon [phone] would like to be seen today for maybe a UTI. she says that it burns really bad and is using the restroom more than normal.dr lowne or any other dr. Worthy Flank have anything  Initial call taken by: Charolette Child,  May 20, 2007 11:03 AM  Follow-up for Phone Call        lmom to call Follow-up by: Kandice Hams,  May 20, 2007 11:27 AM  Additional Follow-up for Phone Call Additional follow up Details #1::        called pt back offered appt 2:15 today pt preferrred ov for tomorrow 7/2 scheduled Additional Follow-up by: Kandice Hams,  May 20, 2007 1:38 PM

## 2010-12-21 NOTE — Assessment & Plan Note (Signed)
Summary: acute only for congestion//ph   Vital Signs:  Patient Profile:   45 Years Old Female Height:     62 inches Weight:      234.50 pounds Temp:     97.8 degrees F oral Pulse rate:   78 / minute Resp:     16 per minute BP sitting:   102 / 68  (left arm)  Pt. in pain?   no  Vitals Entered By: Ardyth Man (December 28, 2008 11:37 AM)                  PCP:  Laury Axon  Chief Complaint:  congestion x 4 days (bronchitis) and Cough.  History of Present Illness:  Cough      This is a 45 year old woman who presents with Cough.  The symptoms began duration > 3 days ago.  Pt here c/o cough and congestion for 4 days.   Pt tried Nyquil and theraflu with no relief.  The patient reports productive cough, but denies non-productive cough, pleuritic chest pain, shortness of breath, wheezing, exertional dyspnea, fever, hemoptysis, and malaise.  Associated symtpoms include cold/URI symptoms, sore throat, and nasal congestion.  The cough is worse with activity and lying down.  Ineffective prior treatments have included OTC cough medication.      Current Allergies: No known allergies        Past Medical History:    Reviewed history from 12/02/2007 and no changes required:       disorder adjustment with anxiety       otitis externa, acute       upper respiratory infection       obesity, nos  Past Surgical History:    Reviewed history from 12/02/2007 and no changes required:       cyst removed left breast       bladder tack       Hysterectomy       Tonsillectomy   Family History:    Reviewed history from 07/28/2008 and no changes required:       mother died 52 of MI triple bypass age 39       father cad       maternal grandmother dm       mother lung cancer       both sides arthritis and depression/alcohol abuse       Family History of CAD Female 1st degree relative <60       Family History of CAD Female 1st degree relative <50  Social History:    Reviewed history from  06/01/2008 and no changes required:       Married       Never Smoked       Alcohol use-no       Drug use-no       Regular exercise-no   Risk Factors: Tobacco use:  never Drug use:  no HIV high-risk behavior:  no Caffeine use:  3 drinks per day Alcohol use:  no Exercise:  no Seatbelt use:  100 %  Family History Risk Factors:    Family History of MI in females < 40 years old:  no    Family History of MI in males < 94 years old:  no   Review of Systems      See HPI   Physical Exam  General:     alert and overweight-appearing.   Ears:     External ear exam shows no significant lesions or deformities.  Otoscopic examination reveals clear canals, tympanic membranes are intact bilaterally without bulging, retraction, inflammation or discharge. Hearing is grossly normal bilaterally. Nose:     L frontal sinus tenderness, L maxillary sinus tenderness, R frontal sinus tenderness, and R maxillary sinus tenderness.   Mouth:     Oral mucosa and oropharynx without lesions or exudates.  Teeth in good repair. Neck:     No deformities, masses, or tenderness noted. Lungs:     Normal respiratory effort, chest expands symmetrically. Lungs are clear to auscultation, no crackles or wheezes. Heart:     Normal rate and regular rhythm. S1 and S2 normal without gallop, murmur, click, rub or other extra sounds. Skin:     Intact without suspicious lesions or rashes Cervical Nodes:     No lymphadenopathy noted Psych:     Cognition and judgment appear intact. Alert and cooperative with normal attention span and concentration. No apparent delusions, illusions, hallucinations    Impression & Recommendations:  Problem # 1:  SINUSITIS- ACUTE-NOS (ICD-461.9)  Her updated medication list for this problem includes:    Augmentin 875-125 Mg Tabs (Amoxicillin-pot clavulanate) .Marland Kitchen... 1 by mouth two times a day    Tussionex Pennkinetic Er 8-10 Mg/46ml Lqcr (Chlorpheniramine-hydrocodone) .Marland Kitchen... 1 tsp by  mouth at bedtime    Flonase 50 Mcg/act Susp (Fluticasone propionate) .Marland Kitchen... 2 sprays each nostril once daily Instructed on treatment. Call if symptoms persist or worsen.   Complete Medication List: 1)  Lorazepam 0.5 Mg Tabs (Lorazepam) .Marland Kitchen.. 1 by mouth three times a day as needed 2)  Furosemide 40 Mg Tabs (Furosemide) .... 1/2-1 by mouth once daily pen 3)  Augmentin 875-125 Mg Tabs (Amoxicillin-pot clavulanate) .Marland Kitchen.. 1 by mouth two times a day 4)  Tussionex Pennkinetic Er 8-10 Mg/56ml Lqcr (Chlorpheniramine-hydrocodone) .Marland Kitchen.. 1 tsp by mouth at bedtime 5)  Flonase 50 Mcg/act Susp (Fluticasone propionate) .... 2 sprays each nostril once daily   Patient Instructions: 1)  fasting labs  272.4  lipid, hep   Prescriptions: FLONASE 50 MCG/ACT SUSP (FLUTICASONE PROPIONATE) 2 sprays each nostril once daily  #1 x 2   Entered and Authorized by:   Loreen Freud DO   Signed by:   Loreen Freud DO on 12/28/2008   Method used:   Electronically to        [facility] [phone]* (retail)       [address]       [place], [place]  [address]       Ph: [phone]       Fax: [phone]   RxID:   [id number] FLONASE 50 MCG/ACT SUSP (FLUTICASONE PROPIONATE) 2 sprays each nostril once daily  #1 x 2   Entered and Authorized by:   Loreen Freud DO   Signed by:   Loreen Freud DO on 12/28/2008   Method used:   Print then Give to Patient   RxID:   [id number] TUSSIONEX PENNKINETIC ER 8-10 MG/5ML LQCR (CHLORPHENIRAMINE-HYDROCODONE) 1 tsp by mouth at bedtime  #6 oz x 0   Entered and Authorized by:   Loreen Freud DO   Signed by:   Loreen Freud DO on 12/28/2008   Method used:   Print then Give to Patient   RxID:   [id number] AUGMENTIN 875-125 MG TABS (AMOXICILLIN-POT CLAVULANATE) 1 by mouth two times a day  #20 x 0   Entered and Authorized by:   Loreen Freud DO   Signed by:   Loreen Freud DO  on 12/28/2008   Method used:   Electronically to        CVS  [facility] [phone]* (retail)       [address]       [place], [place]  [address]       Ph: [phone]       Fax: [phone]   RxID:   [id number]

## 2010-12-21 NOTE — Progress Notes (Signed)
Summary: kidney infection  Phone Note Call from Patient Call back at Work Phone [phone]   Caller: Patient Reason for Call: Acute Illness Summary of Call: dr. hopper patient is having a kidney infection, going to restroom more often, boils on face, strong smell when urine, no burning, there is some itching.  Initial call taken by: Charolette Child,  February 19, 2008 1:24 PM  Follow-up for Phone Call        spoke with pt who c/o burnig with urination,itching sx started yesterday offered appt tomorrow with dr hopper or urgent care today pt said will go to urgent care today...................................................................Marland KitchenKandice Hams  February 19, 2008 2:11 PM  Follow-up by: Kandice Hams,  February 19, 2008 2:11 PM

## 2010-12-21 NOTE — Progress Notes (Signed)
Summary: leg & arms numb-dr lowneDR PAZ FYI [facility] ER  Phone Note Call from Patient Call back at Work Phone [phone]   Caller: Patient Summary of Call: patient has numbness in arms & legs - no pain - eye felt blurred left side  Initial call taken by: Okey Regal [place],  September 17, 2007 9:39 AM  Follow-up for Phone Call        spoke with pt c/o numbness top of left foot,arm tingling last night, sat in hot water last night helped some no tingling now, had blurred vision last night today is fine pt states informed pt should have gone to er last night when has these sx. pt reluctanct said i dont like to go to er, urged pt to go to er either [facility], or [facility] pt agreed, Follow-up by: Kandice Hams,  September 17, 2007 9:46 AM  Additional Follow-up for Phone Call Additional follow up Details #1::        noted....................................................................Jose E. Paz MD  September 18, 2007 1:10 PM

## 2010-12-21 NOTE — Assessment & Plan Note (Signed)
Summary: acute uti   Vital Signs:  Patient profile:   45 year old female Weight:      233.4 pounds Temp:     98.5 degrees F oral Pulse rate:   80 / minute Resp:     15 per minute BP sitting:   112 / 74  (left arm) Cuff size:   large  Vitals Entered By: Shonna Chock (February 10, 2009 2:47 PM) Comments Burning and frequency when urinating. Tried AZO-helped only a little. Naseated x 3 weeks   Primary Care Provider:  Laury Axon   History of Present Illness: Symptoms X 3 days except nausea X 3 weeks. Rx: Azo. PMH of UTIs with childbearing.  Allergies (verified): No Known Drug Allergies  Review of Systems General:  Denies chills, fever, and sweats. GI:  Complains of indigestion; denies abdominal pain, bloody stools, dark tarry stools, and vomiting. GU:  Complains of dysuria and urinary frequency; denies abnormal vaginal bleeding, discharge, hematuria, and urinary hesitancy; Malordorous urine 3 days.  Physical Exam  General:  in no acute distress; alert,appropriate and cooperative throughout examination Abdomen:  Bowel sounds positive,abdomen soft  but minimally tender  RUQ & in epigastrium without masses, organomegaly or hernias noted. Msk:  No flank tenderness to percussion Extremities:  Neg SLR bilat Skin:  Intact without suspicious lesions or rashes Psych:  memory intact for recent and remote, normally interactive, and good eye contact.     Impression & Recommendations:  Problem # 1:  UTI (ICD-599.0)  Orders: UA Dipstick W/ Micro (manual) (16109) T-Culture, Urine ([address]454-09811)  Her updated medication list for this problem includes:    Ciprofloxacin Hcl 500 Mg Tabs (Ciprofloxacin hcl) .Marland Kitchen... 1 two times a day with 8 oz water  Problem # 2:  GERD (ICD-530.81)  Complete Medication List: 1)  Lorazepam 0.5 Mg Tabs (Lorazepam) .Marland Kitchen.. 1 by mouth three times a day as needed 2)  Furosemide 40 Mg Tabs (Furosemide) .... 1/2-1 by mouth once daily pen 3)  Ciprofloxacin Hcl 500 Mg Tabs  (Ciprofloxacin hcl) .Marland Kitchen.. 1 two times a day with 8 oz water 4)  Phenazopyridine Hcl 200 Mg Tabs (Phenazopyridine hcl) .Marland Kitchen.. 1 three times a day as needed for burning  Patient Instructions: 1)  Drink as much fluid as you can tolerate for the next few days. 2)  Avoid foods high in acid (tomatoes, citrus juices, spicy foods). Avoid eating within two hours of lying down or before exercising. Do not over eat; try smaller more frequent meals. Elevate head of bed twelve inches when sleeping. Prilosec OTC 30 min pre breakfast & eve meal. Prescriptions: PHENAZOPYRIDINE HCL 200 MG TABS (PHENAZOPYRIDINE HCL) 1 three times a day as needed for burning  #15 x 0   Entered and Authorized by:   Marga Melnick MD   Signed by:   Marga Melnick MD on 02/10/2009   Method used:   Faxed to ...       [facility] [phone]* (retail)       [address]       [place], [place]  [address]       Ph: [phone]       Fax: [phone]   RxID:   [id number] CIPROFLOXACIN HCL 500 MG TABS (CIPROFLOXACIN HCL) 1 two times a day with 8 oz water  #20 x 0   Entered and Authorized by:   Marga Melnick MD   Signed by:   Marga Melnick MD on 02/10/2009  Method used:   Faxed to ...       [facility] [phone]* (retail)       [address]       [place], [place]  [address]       Ph: [phone]       Fax: [phone]   RxID:   [id number] PHENAZOPYRIDINE HCL 200 MG TABS (PHENAZOPYRIDINE HCL) 1 three times a day as needed for burning  #15 x 0   Entered and Authorized by:   Marga Melnick MD   Signed by:   Marga Melnick MD on 02/10/2009   Method used:   Print then Give to Patient   RxID:   [id number] CIPROFLOXACIN HCL 500 MG TABS (CIPROFLOXACIN HCL) 1 two times a day with 8 oz water  #20 x 0   Entered and Authorized by:   Marga Melnick MD   Signed by:   Marga Melnick MD on 02/10/2009   Method used:   Print then Give to Patient   RxID:    [id number]       Laboratory Results   Urine Tests    Routine Urinalysis   Color: yellow Appearance: Clear Glucose: negative   (Normal Range: Negative) Bilirubin: negative   (Normal Range: Negative) Ketone: negative   (Normal Range: Negative) Spec. Gravity: 1.010   (Normal Range: 1.003-1.035) Blood: negative   (Normal Range: Negative) pH: 6.0   (Normal Range: 5.0-8.0) Protein: negative   (Normal Range: Negative) Urobilinogen: 0.2   (Normal Range: 0-1) Nitrite: positive   (Normal Range: Negative) Leukocyte Esterace: negative   (Normal Range: Negative)    Comments: sent for culture

## 2010-12-21 NOTE — Letter (Signed)
Summary: Results Follow-up Letter  [place] [facility]  [address] [place], [place] [address]   Phone: [phone]  Fax: [phone]    04/07/2009        Mikayla [name] [address] [place], [place]  [address]  Dear Ms. Scheller,   The following are the results of your recent test(s):  Test     Result     Pap Smear    Normal_______  Not Normal_____       Comments: _________________________________________________________ Cholesterol LDL(Bad cholesterol):          Your goal is less than:         HDL (Good cholesterol):        Your goal is more than: _________________________________________________________ Other Tests:   _________________________________________________________  Please call for an appointment Or __Please see attached labwork._______________________________________________________ _________________________________________________________ _________________________________________________________  Sincerely,  Ardyth Man Abie at [facility]

## 2010-12-21 NOTE — Progress Notes (Signed)
Summary: Pt status- FYI  ---- Converted from flag ---- ---- 09/06/2010 11:50 AM, Army Fossa CMA wrote: I spoke with pt and she states that she still have pain in her face, and right side of head, but she is improving. Schedule CPX in a few weeks.   ---- 09/06/2010 11:33 AM, Army Fossa CMA wrote: left message for pt to call back.   ---- 09/04/2010 2:12 PM, Army Fossa CMA wrote: left message for pt to call back.   ---- 08/29/2010 6:10 PM, Jose E. Paz MD wrote: please check of her, has shingles, better? ------------------------------

## 2010-12-21 NOTE — Progress Notes (Signed)
Summary: LOWNE--REFILL alprazolam  Phone Note Refill Request   Refills Requested: Medication #1:  LORAZEPAM 0.5 MG #[facility] [phone] [phone]-LAST FILLED--1.30.09  last ov 07/28/08  Initial call taken by: Freddy Jaksch,  September 02, 2008 10:54 AM  Follow-up for Phone Call        ok to refill x1 Follow-up by: Loreen Freud DO,  September 02, 2008 4:48 PM    New/Updated Medications: LORAZEPAM 0.5 MG TABS (LORAZEPAM) 1 by mouth three times a day as needed   Prescriptions: LORAZEPAM 0.5 MG TABS (LORAZEPAM) 1 by mouth three times a day as needed  #30 x 0   Entered by:   Kandice Hams   Authorized by:   Loreen Freud DO   Signed by:   Kandice Hams on 09/02/2008   Method used:   Printed then faxed to ...       [facility] [phone]* (retail)       [address]       [place], [place]  [address]       Ph: [phone]       Fax: [phone]   RxID:   [id number]

## 2010-12-21 NOTE — Assessment & Plan Note (Signed)
Summary: pain in legs at night/kdc   Vital Signs:  Patient profile:   45 year old female Height:      62 inches Weight:      230.25 pounds BMI:     42.27 Temp:     98.2 degrees F oral Pulse rate:   74 / minute Resp:     14 per minute BP sitting:   100 / 80  (left arm)  Vitals Entered By: Ardyth Man (Apr 06, 2009 11:29 AM) CC: legs cramping and aching at night Is Patient Diabetic? No Pain Assessment Patient in pain? no       Have you ever been in a relationship where you felt threatened, hurt or afraid?No   Does patient need assistance? Functional Status Self care Ambulation Normal   History of Present Illness: Pt here c/o legs cramping at night and restless legs.  Pt only takes lasix 1x a week.  No other complaints.  Allergies (verified): No Known Drug Allergies  Past History:  Past medical, surgical, family and social histories (including risk factors) reviewed, and no changes noted (except as noted below).  Past Medical History:    Reviewed history from 12/02/2007 and no changes required:    disorder adjustment with anxiety    otitis externa, acute    upper respiratory infection    obesity, nos  Past Surgical History:    Reviewed history from 12/02/2007 and no changes required:    cyst removed left breast    bladder tack    Hysterectomy    Tonsillectomy  Family History:    Reviewed history from 07/28/2008 and no changes required:       mother died 37 of MI triple bypass age 91       father cad       maternal grandmother dm       mother lung cancer       both sides arthritis and depression/alcohol abuse       Family History of CAD Female 1st degree relative <60       Family History of CAD Female 1st degree relative <50  Social History:    Reviewed history from 06/01/2008 and no changes required:       Married       Never Smoked       Alcohol use-no       Drug use-no       Regular exercise-no  Review of Systems      See HPI  Physical  Exam  General:  Well-developed,well-nourished,in no acute distress; alert,appropriate and cooperative throughout examination Lungs:  Normal respiratory effort, chest expands symmetrically. Lungs are clear to auscultation, no crackles or wheezes. Heart:  Normal rate and regular rhythm. S1 and S2 normal without gallop, murmur, click, rub or other extra sounds. Extremities:  No clubbing, cyanosis, edema, or deformity noted with normal full range of motion of all joints.   Psych:  Cognition and judgment appear intact. Alert and cooperative with normal attention span and concentration. No apparent delusions, illusions, hallucinations   Impression & Recommendations:  Problem # 1:  CALF CRAMPS, NOCTURNAL (ICD-729.82) requip qpm call or rto as needed banana or oj when take lasix Orders: Venipuncture ([address]109) TLB-BMP (Basic Metabolic Panel-BMET) (80048-METABOL) TLB-TSH (Thyroid Stimulating Hormone) (84443-TSH) TLB-Magnesium (Mg) (83735-MG) T-Vitamin D (25-Hydroxy) (60454-09811)  Complete Medication List: 1)  Lorazepam 0.5 Mg Tabs (Lorazepam) .Marland Kitchen.. 1 by mouth three times a day as needed 2)  Furosemide 40 Mg Tabs (Furosemide) .... 1/2-1 by  mouth once daily pen 3)  Requip 0.25 Mg Tabs (Ropinirole hcl) .Marland Kitchen.. 1 by mouth once daily for 3 days then 2 by mouth qpm Prescriptions: REQUIP 0.25 MG TABS (ROPINIROLE HCL) 1 by mouth once daily for 3 days then 2 by mouth qpm  #60 x 0   Entered and Authorized by:   Loreen Freud DO   Signed by:   Loreen Freud DO on 04/06/2009   Method used:   Electronically to        [facility] [phone]* (retail)       [address]       [place], [place]  [address]       Ph: [phone]       Fax: [phone]   RxID:   [id number]

## 2010-12-21 NOTE — Progress Notes (Signed)
Summary: lowne--results  Phone Note Call from Patient Call back at Work Phone [phone]   Caller: Patient Reason for Call: Lab or Test Results Summary of Call: Patient is calling about her stress test result. Initial call taken by: Freddy Jaksch,  September 13, 2008 9:07 AM  Follow-up for Phone Call        Dr. Laury Axon, Please advise.  Ardyth Man  September 14, 2008 7:57 AM  Follow-up by: Ardyth Man,  September 14, 2008 7:57 AM  Additional Follow-up for Phone Call Additional follow up Details #1::        don't have results Additional Follow-up by: Loreen Freud DO,  September 14, 2008 12:32 PM    Additional Follow-up for Phone Call Additional follow up Details #2::    See append for stress test. Normal stress test per Dr. Laury Axon. Ardyth Man  September 14, 2008 2:54 PM  Follow-up by: Ardyth Man,  September 14, 2008 2:54 PM

## 2010-12-21 NOTE — Letter (Signed)
Summary: Results Follow up Letter  [facility]  [address] [place], [place] [address]   Phone: [phone]  Fax: [phone]    11/21/2009 MRN: [id number]  SERGIO HOBART [address] [place], [place]  [address]  Dear Ms. Fifield,  The following are the results of your recent test(s):  Test         Result    Pap Smear:        Normal _____  Not Normal _____ Comments: ______________________________________________________ Cholesterol: LDL(Bad cholesterol):         Your goal is less than:         HDL (Good cholesterol):       Your goal is more than: Comments:  ______________________________________________________ Mammogram:        Normal _____  Not Normal _____ Comments:  ___________________________________________________________________ Hemoccult:        Normal _____  Not normal _______ Comments:    _____________________________________________________________________ Other Tests: Please see attached labs(culture) done on 11/15/2009.    We routinely do not discuss normal results over the telephone.  If you desire a copy of the results, or you have any questions about this information we can discuss them at your next office visit.   Sincerely,

## 2011-01-30 ENCOUNTER — Telehealth: Payer: Self-pay | Admitting: Family Medicine

## 2011-01-30 LAB — POCT HEMOGLOBIN-HEMACUE: Hemoglobin: 13.7 g/dL (ref 12.0–15.0)

## 2011-02-06 NOTE — Progress Notes (Signed)
Summary: refill  Phone Note Refill Request Message from:  Fax from Pharmacy on January 30, 2011 9:35 AM  Refills Requested: Medication #1:  LORAZEPAM 0.5 MG TABS 1 by mouth three times a day as needed   Last Refilled: 11/30/2010 [facility] - [id number]  Initial call taken by: Okey Regal [place],  January 30, 2011 9:40 AM  Follow-up for Phone Call        last seen 09/14/10 and filled 11/30/10 please advise Follow-up by: Almeta Monas CMA Duncan Dull),  January 30, 2011 11:52 AM  Additional Follow-up for Phone Call Additional follow up Details #1::        refill x1 Additional Follow-up by: Loreen Freud DO,  January 30, 2011 11:56 AM    Prescriptions: LORAZEPAM 0.5 MG TABS (LORAZEPAM) 1 by mouth three times a day as needed  #60 x 0   Entered by:   Almeta Monas CMA (AAMA)   Authorized by:   Loreen Freud DO   Signed by:   Almeta Monas CMA (AAMA) on 01/30/2011   Method used:   Printed then faxed to ...       [facility] [phone]* (retail)       [address]       [place], [place]  [address]       Ph: [phone]       Fax: [phone]   RxID:   [id number]

## 2011-02-26 LAB — CBC
HCT: 30.9 % — ABNORMAL LOW (ref 36.0–46.0)
HCT: 38.1 % (ref 36.0–46.0)
Hemoglobin: 10.9 g/dL — ABNORMAL LOW (ref 12.0–15.0)
Hemoglobin: 13.4 g/dL (ref 12.0–15.0)
MCHC: 35.2 g/dL (ref 30.0–36.0)
MCHC: 35.1 g/dL (ref 30.0–36.0)
MCV: 94.4 fL (ref 78.0–100.0)
MCV: 94 fL (ref 78.0–100.0)
Platelets: 123 10*3/uL — ABNORMAL LOW (ref 150–400)
Platelets: 156 10*3/uL (ref 150–400)
RBC: 3.28 MIL/uL — ABNORMAL LOW (ref 3.87–5.11)
RBC: 4.05 MIL/uL (ref 3.87–5.11)
RDW: 12.5 % (ref 11.5–15.5)
RDW: 12.4 % (ref 11.5–15.5)
WBC: 6.6 10*3/uL (ref 4.0–10.5)
WBC: 4.5 10*3/uL (ref 4.0–10.5)

## 2011-02-26 LAB — COMPREHENSIVE METABOLIC PANEL
ALT: 15 U/L (ref 0–35)
AST: 15 U/L (ref 0–37)
Albumin: 3.5 g/dL (ref 3.5–5.2)
Alkaline Phosphatase: 63 U/L (ref 39–117)
BUN: 12 mg/dL (ref 6–23)
CO2: 27 mEq/L (ref 19–32)
Calcium: 9 mg/dL (ref 8.4–10.5)
Chloride: 106 mEq/L (ref 96–112)
Creatinine, Ser: 0.6 mg/dL (ref 0.4–1.2)
GFR calc Af Amer: >60 mL/min (ref >60)
GFR calc non Af Amer: >60 mL/min (ref >60)
Glucose, Bld: 86 mg/dL (ref 70–99)
Potassium: 3.9 mEq/L (ref 3.5–5.1)
Sodium: 139 mEq/L (ref 135–145)
Total Bilirubin: 0.6 mg/dL (ref 0.3–1.2)
Total Protein: 6.4 g/dL (ref 6.0–8.3)

## 2011-03-20 ENCOUNTER — Other Ambulatory Visit: Payer: Self-pay

## 2011-03-20 MED ORDER — LORAZEPAM 0.5 MG PO TABS: 0.5000 mg | ORAL_TABLET | Freq: Three times a day (TID) | ORAL | Status: DC | PRN

## 2011-03-20 NOTE — Telephone Encounter (Signed)
Last seen 09/14/10 and filled 01/30/11 please advise    KP

## 2011-03-20 NOTE — Telephone Encounter (Signed)
Faxed.   KP 

## 2011-03-21 ENCOUNTER — Ambulatory Visit
Admission: RE | Admit: 2011-03-21 | Discharge: 2011-03-21 | Disposition: A | Discharge status: Home / Self Care | Payer: BC Managed Care – PPO | Source: Ambulatory Visit | Attending: Plastic Surgery | Admitting: Plastic Surgery

## 2011-03-21 ENCOUNTER — Other Ambulatory Visit: Payer: Self-pay | Admitting: Plastic Surgery

## 2011-03-21 DIAGNOSIS — Z1231 Encounter for screening mammogram for malignant neoplasm of breast: Secondary | ICD-10-CM

## 2011-03-27 ENCOUNTER — Encounter: Payer: Self-pay | Admitting: Family Medicine

## 2011-03-28 ENCOUNTER — Encounter: Payer: Self-pay | Admitting: Family Medicine

## 2011-03-28 ENCOUNTER — Ambulatory Visit (INDEPENDENT_AMBULATORY_CARE_PROVIDER_SITE_OTHER): Payer: BC Managed Care – PPO | Admitting: Family Medicine

## 2011-03-28 VITALS — BP 100/70 | Temp 98.5°F | Wt 234.0 lb

## 2011-03-28 DIAGNOSIS — N390 Urinary tract infection, site not specified: Secondary | ICD-10-CM

## 2011-03-28 DIAGNOSIS — G4700 Insomnia, unspecified: Secondary | ICD-10-CM

## 2011-03-28 DIAGNOSIS — J029 Acute pharyngitis, unspecified: Secondary | ICD-10-CM | POA: Insufficient documentation

## 2011-03-28 DIAGNOSIS — M549 Dorsalgia, unspecified: Secondary | ICD-10-CM

## 2011-03-28 LAB — POCT URINALYSIS DIPSTICK
Bilirubin, UA: NEGATIVE
Blood, UA: NEGATIVE
Glucose, UA: NEGATIVE
Ketones, UA: NEGATIVE
Leukocytes, UA: NEGATIVE
Nitrite, UA: NEGATIVE
Protein, UA: NEGATIVE
Spec Grav, UA: 10.2
Urobilinogen, UA: NEGATIVE
pH, UA: 6.5

## 2011-03-28 NOTE — Progress Notes (Signed)
  Subjective:    Patient ID: Mikayla [name], female    DOB: 1965-12-31, 45 y.o.   MRN: [id number]  HPI Sore throat- sxs started yesterday, difficulty swallowing.  No nasal congestion, ear pain, cough.  + body aches.  No known sick contacts.                                                                                  ?UTI- pt reports frequent UTIs, increased frequency started 1 week ago.  Hx of normal urine dips but + urine cxs.  No fevers or chills.  Insomnia- reports she has not been sleeping well b/c husband has been out of town for 10 days.  Trouble falling asleep.  Ok once asleep.   Review of Systems For ROS see HPI     Objective:   Physical Exam  Constitutional: She appears well-developed and well-nourished. No distress.  HENT:  Head: Normocephalic and atraumatic.       TMs normal bilaterally Nose is mildly congested Posterior pharynx w/out erythema or exudate  Neck: Normal range of motion. Neck supple.  Cardiovascular: Normal rate and regular rhythm.   Pulmonary/Chest: Effort normal and breath sounds normal. No respiratory distress. She has no wheezes.  Abdominal: Soft.       No CVA or suprapubic tenderness  Lymphadenopathy:    She has no cervical adenopathy.          Assessment & Plan:

## 2011-03-28 NOTE — Patient Instructions (Signed)
This appears to be the start of a viral illness- your throat looks fine! Tylenol or ibuprofen as needed for pain or fever Drink plenty of fluids REST! We'll notify you of your urine culture Try tylenol PM or Advil PM as needed for sleep Call with any questions or concerns Hang in there!

## 2011-04-03 ENCOUNTER — Encounter: Payer: Self-pay | Admitting: Internal Medicine

## 2011-04-03 ENCOUNTER — Ambulatory Visit (INDEPENDENT_AMBULATORY_CARE_PROVIDER_SITE_OTHER): Payer: BC Managed Care – PPO | Admitting: Internal Medicine

## 2011-04-03 VITALS — BP 108/60 | Temp 98.7°F | Wt 227.0 lb

## 2011-04-03 DIAGNOSIS — S4992XA Unspecified injury of left shoulder and upper arm, initial encounter: Secondary | ICD-10-CM

## 2011-04-03 DIAGNOSIS — S6990XA Unspecified injury of unspecified wrist, hand and finger(s), initial encounter: Secondary | ICD-10-CM

## 2011-04-03 DIAGNOSIS — S6980XA Other specified injuries of unspecified wrist, hand and finger(s), initial encounter: Secondary | ICD-10-CM

## 2011-04-03 DIAGNOSIS — S4980XA Other specified injuries of shoulder and upper arm, unspecified arm, initial encounter: Secondary | ICD-10-CM

## 2011-04-03 DIAGNOSIS — S6992XA Unspecified injury of left wrist, hand and finger(s), initial encounter: Secondary | ICD-10-CM

## 2011-04-03 MED ORDER — TRAMADOL HCL 50 MG PO TABS: 50.0000 mg | ORAL_TABLET | Freq: Four times a day (QID) | ORAL | Status: DC | PRN

## 2011-04-03 MED ORDER — LORAZEPAM 0.5 MG PO TABS: 0.5000 mg | ORAL_TABLET | Freq: Three times a day (TID) | ORAL | Status: AC | PRN
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
## 2011-04-03 NOTE — H&P (Signed)
NAMEJAZALYNN, Mikayla [name]                 ACCOUNT NO.:  [id number]   MEDICAL RECORD NO.:  [id number]        PATIENT TYPE:  WAMB   LOCATION:                                FACILITY:  [facility]   PHYSICIAN:  Guy Sandifer. Henderson Cloud, M.D. DATE OF BIRTH:  10-14-1966   DATE OF ADMISSION:  05/03/2009  DATE OF DISCHARGE:                              HISTORY & PHYSICAL   CHIEF COMPLAINT:  Recurrent pelvic relaxation and stress incontinence.   HISTORY OF PRESENT ILLNESS:  This patient is a 45 year old married white  female G4, P2, who is status post total vaginal hysterectomy with  anterior repair in 1989, posterior vaginal repair, and RAZ  cystourethropexy in 1992, and a posterior vaginal repair with lysis of  adhesions at laparoscopy in 2003.  The patient has recurrent symptoms of  having 2 splint with a bowel movement as well as feeling as though  things are falling out of the vagina.  She also has recurrent stress  urinary incontinence.  The stress urinary incontinence has been  evaluated by Dr. [name], who will address that at this admission as well.  After discussion of options, she is being admitted for anterior and  posterior colporrhaphy with insertion of mesh and colpopexy.  The  potential risks and complications have been reviewed preoperatively.   PAST MEDICAL HISTORY:  Current UTIs.   PAST SURGICAL HISTORY:  Vaginal hysterectomy with anterior repair in  1989, tonsillectomy, and posterior vaginal repair with RAZ  cystourethropexy in 1992, laparoscopy with lysis of adhesions, and  posterior vaginal repair 2003.   OBSTETRIC HISTORY:  Vaginal delivery x2, miscarriage x2.   SOCIAL HISTORY:  The patient denies tobacco, alcohol, or drug abuse.   MEDICATIONS:  None.   ALLERGIES:  No known drug allergies.   REVIEW OF SYSTEMS:  NEURO:  Denies headache.  CARDIO:  Denies chest  pain.  PULMONARY:  Denies shortness of breath.   PHYSICAL EXAMINATION:  VITAL SIGNS:  Height 5 feet, 2-3/4 inches, weight  234.4 pounds, blood pressure 108/76.  LUNGS:  Clear to auscultation.  HEART:  Regular rate and rhythm.  ABDOMEN:  Soft, nontender without masses.  PELVIC:  Vulva and vagina without lesion.  First and second-degree  descent of the upper vagina.  Fair support of the vaginal cuff.  Rectal  sphincter has good tone.  There is a lax rectovaginal septum.  EXTREMITIES:  Grossly within normal limits.  NEUROLOGICAL:  Grossly within normal limits.   ASSESSMENT:  1. Recurrent pelvic relaxation.  2. Recurrent stress urinary incontinence.   PLAN:  1. Anterior and posterior repair with probable grafts.  2. Surgery for stress incontinence, per Dr. [name].       Guy Sandifer Henderson Cloud, M.D.  Electronically Signed     JET/MEDQ  D:  04/28/2009  T:  04/29/2009  Job:  [id number]

## 2011-04-03 NOTE — Patient Instructions (Signed)
Consider glucosamine sulfate 1500 mg daily until the thumb pain is completely resolved. Use warm moist compresses or Zostrix cream. Do  not put the compresses on top of Zostrix cream; this would cause a mustard plaster affect.

## 2011-04-03 NOTE — Discharge Summary (Signed)
Mikayla [name], Mikayla [name]                 ACCOUNT NO.:  [id number]   MEDICAL RECORD NO.:  [id number]          PATIENT TYPE:  OIB   LOCATION:  9304                          FACILITY:  [facility]   PHYSICIAN:  Guy Sandifer. Henderson Cloud, M.D. DATE OF BIRTH:  08-30-1966   DATE OF ADMISSION:  05/03/2009  DATE OF DISCHARGE:  05/04/2009                               DISCHARGE SUMMARY   ADMITTING DIAGNOSES:  Pelvic relaxation and stress urinary continence.   DISCHARGE DIAGNOSES:  Pelvic relaxation and stress urinary continence.   PROCEDURES:  1. On May 03, 2009, anterior colporrhaphy with repair of cystocele,      vaginal colpopexy, insertion of mesh, posterior colpopexy with      repair of rectocele.  2. SPARC sling per Excell Seltzer. [name], MD   REASON FOR ADMISSION:  The patient is a 45 year old married white female  G4, P2, with recurrent pelvic relaxation and stress incontinence.  Details are dictated in the history and physical.  She is admitted to  the hospital for surgical management.   HOSPITAL COURSE:  The patient is admitted to the hospital, undergoes the  above procedures.  She tolerated the procedures well.  Estimated blood  loss was 150 mL.  On the evening of surgery, she has good pain relief  with mild nausea.  Vital signs were stable and she is afebrile.  Urine  output is clear.  Blood pressures were 90s/50s.  Abdomen is soft with  good bowel sounds.  She is given an IV fluid bolus.  On the day of  discharge, vital signs were stable and she remains afebrile.  Hemoglobin  is 10.9.  Foley catheter has been removed and she is awaiting her  voiding trials.  She will be discharged home following that.   CONDITION ON DISCHARGE:  Good.   DIET:  Regular as tolerated.   ACTIVITY:  No lifting, no operation of automobiles, no vaginal entry.   She is to call the office for problems including, but not limited to  temperature of 101 degrees, persistent nausea, vomiting, increasing pain  or heavy vaginal  bleeding.   MEDICATIONS:  1. Percocet 5/325 mg #40 one to two p.o. q.6 h. p.r.n.  2. Ibuprofen 600 mg q.6 h. p.r.n.  3. Multivitamin daily.   FOLLOWUP:  With me in 2-3 weeks and with Dr. [name] on May 18, 2009.      Guy Sandifer Henderson Cloud, M.D.  Electronically Signed     JET/MEDQ  D:  05/04/2009  T:  05/04/2009  Job:  [id number]   cc:   Excell Seltzer. [name], M.D.  Fax: [phone]

## 2011-04-03 NOTE — Op Note (Signed)
NAMEGIADA, SCHOPPE                 ACCOUNT NO.:  [id number]   MEDICAL RECORD NO.:  [id number]          PATIENT TYPE:  OIB   LOCATION:  9304                          FACILITY:  [facility]   PHYSICIAN:  Guy Sandifer. Henderson Cloud, M.D. DATE OF BIRTH:  08/08/1966   DATE OF PROCEDURE:  05/03/2009  DATE OF DISCHARGE:                               OPERATIVE REPORT   PREOPERATIVE DIAGNOSIS:  Pelvic relaxation.   POSTOPERATIVE DIAGNOSIS:  Pelvic relaxation.   PROCEDURE:  Anterior colporrhaphy with repair of cystocele, vaginal  colpopexy, insertion of mesh, posterior colporrhaphy with repair of  rectocele.   SURGEON:  Guy Sandifer. Henderson Cloud, MD   ANESTHESIA:  General with endotracheal intubation.   ESTIMATED BLOOD LOSS:  150 mL.   INDICATIONS AND CONSENT:  This patient is a 45 year old married white  female G4, P2, status post total vaginal hysterectomy with anterior  repair in 1989, posterior vaginal repair with rate cystourethropexy in  1992, and posterior vaginal repair in 2003.  She has recurrent symptoms  of pelvic relaxation.  She also has symptoms of stress incontinence.  These were addressed by Dr. [name].  Options for managing the pelvic  relaxation has been reviewed.  Anterior colporrhaphy and posterior  colporrhaphy with a possible mesh has been discussed preoperatively.  Potential risks and complications have been discussed preoperatively  including but limited to infection, organ damage, bleeding requiring  transfusion of blood products with HIV and hepatitis acquisition, DVT,  PE, pneumonia, fistula formation, dyspareunia, need for vaginal  dilators, pelvic pain.  All questions were answered and consent signed  on the chart.   DESCRIPTION OF PROCEDURE:  Dr. [name] formed a spark sling which was  dictated under separate note.  At the completion of this case, I entered  room.  The patient was asleep and had been prepped and draped.  A time-  out undertaken.  The patient was in the dorsal  lithotomy position.  The  anterior vaginal mucosa was injected with 1% Xylocaine with 1:1000  epinephrine.  A transverse vaginal incision was made in the anterior  vaginal wall.  At a point about halfway from the introitus to the apex.  The vaginal mucosa was then dissected from the underlying bladder from  that point toward the vaginal apex sharply and bluntly.  Dissection was  carried out bluntly to the ischial spines bilaterally.  The cystocele  was reduced.  An uphold graft was then placed using the Capio needle  driver to place the arms through these sacrospinous ligaments  bilaterally at least one fingerbreadth medial to the ischial spine.  They were then pulled through to place the uphold graft.  Care was taken  to make sure the graft was centered and has no folds or rolls in it.  A  2-0 Vicryl suture was used to anchor the graft to the backside of the  vaginal mucosa.  The graft was then seated, the arms pulled through for  proper placement.  The sheath was removed along with the suture from  each arm intact.  Excess arm was trimmed.  The vaginal mucosa was  then  closed in a running locking fashion with 2-0 Monocryl suture.  Posterior  vaginal repair was then done.  A diamond shaped wedge of tissue was  removed from the posterior perineal body.  Posterior vaginal mucosa was  injected with the same solution.  Posterior vaginal mucosa was dissected  from the underlying rectum in the midline.  This then carried out  laterally, sharply, and bluntly.  There was an excellent support of the  lower half of the vagina.  The upper half, the rectovaginal fascia was  reapproximated with 0 Monocryl suture.  This offers a good support for  the length of the posterior vagina.  Excess mucosa was trimmed and the  posterior vaginal mucosa was closed in running locking fashion with 2-0  Monocryl suture down the level perineal body.  Perineal body was  dissected, reapproximated with interrupted 0  Monocryl suture and a 2-0  Monocryl suture was then continued on the closed and standard episiotomy-  type fashion.  Vaginal packing was placed.  All counts were correct.  The patient was awakened, taken to recovery room in stable condition.       Guy Sandifer Henderson Cloud, M.D.  Electronically Signed     JET/MEDQ  D:  05/03/2009  T:  05/04/2009  Job:  [id number]

## 2011-04-03 NOTE — Op Note (Signed)
NAMEJERZEE, Mikayla [name]                 ACCOUNT NO.:  [id number]   MEDICAL RECORD NO.:  [id number]          PATIENT TYPE:  OIB   LOCATION:  9304                          FACILITY:  [facility]   PHYSICIAN:  Excell Seltzer. [name], M.D.    DATE OF BIRTH:  [birth year]   DATE OF PROCEDURE:  05/03/2009  DATE OF DISCHARGE:                               OPERATIVE REPORT   PROCEDURE:  SPARC sling.   PREOPERATIVE DIAGNOSIS:  Stress incontinence.   POSTOPERATIVE DIAGNOSIS:  Stress incontinence.   SURGEON:  Excell Seltzer. [name], MD   ANESTHESIA:  General.   DRAIN:  A 16-French Foley catheter.   COMPLICATIONS:  None.   INDICATIONS:  [name] is a 45 year old white female with stress urinary  incontinence who is to undergo prolapse repair and sling.   FINDINGS AND PROCEDURE:  She was given Ancef.  She was taken to the  operating room where general anesthetic was induced.  She was placed in  a lithotomy position.  Her lower abdomen, perineum, and genitalia were  prepped with Betadine solution.  She was draped in the usual sterile  fashion.  A weighted vaginal retractor was placed in the anterior  vaginal wall.  The mid urethral level was infiltrated with 7 mL of 1%  lidocaine with epinephrine.  A midline incision was made over the mid  urethra.  The mucosa was elevated off the pubourethral fascia allowing  placement of a finger within the incision on each side.  At this point,  2 separate stab wounds were made at the level of the pubis approximately  1 on each side 2 cm from the midline.  The fat was spread to the fascia.  The [facility] trocars were then passed first on the right and then on the  left.  The tip of the trocar was brought to the top of the pubis, walked  along the back of the pubis, then directed using a finger into the  vaginal incision.  Once both trocars had been placed, cystoscopy was  performed using 22-French scope and 70-degree lens.  No evidence of  bladder wall or urethral injury was noted.  The  bladder was then  drained.  The [facility] mesh was secured to the trocars and pulled in the  appropriate position.  Repeat cystoscopy once again revealed no evidence  of bladder wall injury.  The Foley catheter was replaced.  The [facility]  mesh sheaths were removed once appropriate tension had been applied.  The position of the mesh was checked with a right-angle clamp indicating  appropriate minimal  tension over the mid urethra.  The anterior vaginal wall was then closed  using a running locked 2-0 Vicryl suture.  The abdominal incisions were  then closed using Dermabond.  At this point, Dr. Henderson Cloud came in and  performed his vault suspension repair.  There were no complications  during my portion of the procedure.      Excell Seltzer. [name], M.D.  Electronically Signed     JJW/MEDQ  D:  05/03/2009  T:  05/04/2009  Job:  [id number]

## 2011-04-06 NOTE — Discharge Summary (Signed)
[facility]  Patient:    Mikayla [name], Mikayla [name] Visit Number: [id number] MRN: [id number]          Service Type: DSU Location: 9300 [id number] Attending Physician:  Soledad Gerlach Dictated by:   Guy Sandifer Arleta Creek, M.D. Admit Date:  02/10/2002 Discharge Date: 02/11/2002                             Discharge Summary  ADMITTING DIAGNOSES:          1. Right ovarian cyst.                               2. Rectocele.                               3. Dyspareunia.  DISCHARGE DIAGNOSES:          1. Pelvic adhesions.                               2. Rectocele.  PROCEDURE:                    On February 10, 2002 posterior vaginal repair and                               laparoscopy with extensive lysis of adhesions.  REASON FOR ADMISSION:         This patient is a 45 year old, married, white female, G3, P2, status post vaginal hysterectomy and anterior vaginal repair as well as RAZ cystourethropexy in the past, is admitted with dyspareunia, right ovarian cyst and symptomatic rectocele.  The patient is admitted for surgical therapy.  HOSPITAL COURSE:              The patient was admitted to the hospital and underwent the above procedures.  On the evening of surgery, she is resting well with pain medications.  Vital signs are stable.  She is afebrile.  Urine output is clear.  Abdomen is soft.  On the day of discharge, she has been up on her feet.  She has voided well.  She is resting comfortably.  Vital signs are stable and she is afebrile. Abdomen is soft with normal bowel sounds. Vaginal pack is removed.  White count is 6.1 and hemoglobin is 10.6.  CONDITION ON DISCHARGE:       Good.  DIET:                         Regular as tolerated.  ACTIVITY:                     No lifting. No vaginal entry.  No rectal entry. No driving automobiles for at least two weeks.  DISCHARGE INSTRUCTIONS:       The patient is to call the office for problems including but  not limited to heavy vaginal bleeding, increasing pain, persistent nausea, vomiting or temperature of 101 or greater.  DISCHARGE MEDICATIONS:        1. Percocet 5/325 mg, #30, one to two p.o. q.6h.  p.r.n.                               2. Ibuprofen 600 mg q.6h. p.r.n.                               3. Multivitamin q.d.                               4. Stool softeners are discussed.  The patient is prohibited from using rectal suppositories or enemas.  FOLLOW-UP:                    In the office in two weeks. Dictated by:   Guy Sandifer Arleta Creek, M.D. Attending Physician:  Soledad Gerlach DD:  02/11/02 TD:  02/12/02 Job: [id number]

## 2011-04-06 NOTE — H&P (Signed)
[facility]  Patient:    Mikayla [name], Mikayla [name] Visit Number: [id number] MRN: [id number]          Service Type: Attending:  Guy Sandifer. Arleta Creek, M.D. Dictated by:   Guy Sandifer Arleta Creek, M.D. Adm. Date:  02/10/02                           History and Physical  CHIEF COMPLAINT:              Ovarian cyst and rectocele.  HISTORY OF PRESENT ILLNESS:   This patient is a 45 year old married, white female, G3, P2, status post total vaginal hysterectomy and anterior vaginal repair in about 1989.  She subsequently underwent posterior vaginal repair and a RAZ cystourethropexy in 1992.  She complains of recurring difficulties with a rectocele including difficulty with bowel movements and feeling as though the rectum is going to fall out at times.  She is also complaining of some pelvic pain, as well as some sharp type pain with intercourse.  Ultrasound on January 07, 2002 revealed the right ovary to contain a 4.8 cm cystic mass with internal echoes consistent with either hemorrhagic cyst versus endometrioma.  The left ovary contained multiple small follicles.  There was no free fluid.  After discussion of the options, the patient is being admitted for laparoscopy, probable ovarian cystectomy, possible oophorectomy and posterior vaginal repair.  PAST MEDICAL HISTORY:         Recurrent urinary tract infections.  PAST SURGICAL HISTORY:        Vaginal hysterectomy with anterior repair in 1989, tonsillectomy and posterior vaginal repair with RAZ cystourethropexy in 1992.  OBSTETRIC HISTORY:            Vaginal delivery x 2, miscarriage x 2.  SOCIAL HISTORY:               Patient denies alcohol, tobacco or drug abuse.  MEDICATIONS:                  None.  ALLERGIES:                    No known drug allergies.  REVIEW OF SYSTEMS:            Negative, except as above.  PHYSICAL EXAMINATION:  GENERAL:                      Height 5 feet 2 inches, weight 222  pounds.  VITAL SIGNS:                  Blood pressure 96/60.  HEENT:                        Without thyromegaly.  LUNGS:                        Clear to auscultation.  HEART:                        Regular rate and rhythm.  BACK:                         Without CVA tenderness.  BREASTS:                      Without mass, retraction, discharge.  ABDOMEN:  Obese, soft, nontender without masses.  PELVIC EXAMINATION:           Vulva and vagina without lesion.  Second degree rectocele noted.  Right adnexa is mildly tender with fullness.  Left adnexa nontender without masses.  Rectal examination without masses consistent with rectocele.  EXTREMITIES/NEUROLOGIC:       Grossly within normal limits.  ASSESSMENT:                   1. Pelvic pain with right ovarian cyst.                               2. Recurrent rectocele.  PLAN:                         Laparoscopy, possible ovarian cystectomy, possible oophorectomy, and posterior vaginal repair. Dictated by:   Guy Sandifer Arleta Creek, M.D. Attending:  Guy Sandifer Arleta Creek, M.D. DD:  02/03/02 TD:  02/03/02 Job: [id number]

## 2011-04-06 NOTE — Op Note (Signed)
[facility]  Patient:    Mikayla [name], Mikayla [name] Visit Number: [id number] MRN: [id number]          Service Type: DSU Location: 9300 [id number] Attending Physician:  Soledad Gerlach Dictated by:   Guy Sandifer Arleta Creek, M.D. Proc. Date: 02/10/02 Admit Date:  02/10/2002                             Operative Report  PREOPERATIVE DIAGNOSIS:       Right ovarian cyst.  Rectocele.  Dyspareunia.  POSTOPERATIVE DIAGNOSIS:      Pelvic adhesions.  Rectocele.  OPERATION:                    Posterior vaginal repair and laparoscopy with extensive lysis of adhesions.  SURGEON:                      Guy Sandifer. Arleta Creek, M.D.  ASSISTANT:  ANESTHESIA:                   General with endotracheal intubation.  ESTIMATED BLOOD LOSS:         Less than 50 cc.  INDICATIONS:                  This patient is a 45 year old married white female, gravida 3, para 2, status post total vaginal hysterectomy and anterior repair with a subsequent posterior vaginal repair and RAZ cystourethropexy in 1992, who complains of recurrent problems of dyspareunia as well as symptoms of rectocele.  She also has a right ovarian cyst that is probably hemorrhagic measuring 4.8 cm noted on ultrasound on January 07, 2002.  Laparoscopy with possible ovarian cystectomy and possible oophorectomy was discussed.  The patient wants to retain her ovaries if at all possible.  Posterior vaginal repair is also discussed.  Potential risks and complications are discussed including, but not limited to, infection, bowel, bladder, ureteral damage, bleeding requiring transfusion of blood products with possible transfusion reaction, HIV, and hepatitis acquisition, DVT, PE, and pneumonia, fistula formation, recurrence of rectocele and/or cystocele, and postoperative dyspareunia.  All questions were answered and consent is signed and on the chart.  FINDINGS:                     Upper abdomen is grossly normal.   In the pelvis, the right ovary contains a 1 cm or less involuting translucent cyst.  The left ovary is normal in size.  There are multiple adhesions of the epiploica to the vaginal cuff.  These adhesions overlay the left ovary.  They are adherent to the medial pole of the right ovary.  DESCRIPTION OF PROCEDURE:     The patient is taken to the operating room and placed in the dorsal supine position where general anesthesia is induced via endotracheal intubation.  She is then placed in the dorsal lithotomy position where she is prepped abdominally and vaginally.  The bladder is straight catheterized and a ring forcep with a sponge is placed in the vagina to be used as a Financial trader.  She is draped in a sterile fashion.  A small infraumbilical incision is made and a 10/11 disposable trocar sleeve is placed without difficulty.  Placement is verified with the laparoscope and no damage to surrounding structures is noted.  Pneumoperitoneum is induced.  A small suprapubic incision is made and a 5 mm disposable bladeless  trocar is placed under direct visualization without difficulty.  The above findings are noted. The adhesions to the vaginal cuff are taken down with extensive sharp and blunt dissection.  Bipolar cautery is used to maintain hemostasis.  Adhesions overlying the left ovary and adherent to the right ovary are also taken down. Hemostasis is again maintained with cautery.  Copious irrigation is carried out. The right ovary is aspirated.  However, no fluid can be obtained.  After assuring hemostasis and removing excess fluid, intercede is backloaded through the laparoscope and placed around the right ovary as well as across the vaginal cuff and slightly moistened.  Excess fluid is removed.  The inferior trocar sleeve is removed, the pneumoperitoneum is reduced, and good hemostasis is noted all around.  The umbilical trocar sleeve is removed.  The subcutaneous tissues in the umbilical  incision is closed with 2-0 Vicryl with care being taken not to pick up any underlying structures.  The skin incisions are closed with Dermabond.  Incisions are injected with 0.5% plain Marcaine. Next, attention is turned to the vagina.  Careful examination reveals the majority of the rectocele as redundant mucosa as well as a bit of the rectocele in the lower 1/2 of the vagina.  A diamond-shaped wedge of tissue is removed from the posterior perineal body and the mucosa is dissected in the midline from the underlying rectum up to a point about 1/2 way up the vagina. This is then dissected bilaterally.  Excess tissue was removed.  There is reasonable support up to this point and 2-0 Monocryl suture is used to reapproximate the mucosa in a running locking fashion down to the level of the introitus.  The perineal body is developed and recreated with 0 Monocryl interrupted figure-of-eight sutures.  The mucosal closure is then carried on in a standard episiotomy-type fashion.  Two-inch Iodoform gauze is placed in the vagina.  Foley catheter is placed in the bladder and clear urine is noted. All sponge, needle, and instrument counts were correct.  The patient is awakened and taken to the recovery room in stable condition. Dictated by:   Guy Sandifer Arleta Creek, M.D. Attending Physician:  Soledad Gerlach DD:  02/10/02 TD:  02/11/02 Job: [id number]

## 2011-04-06 NOTE — Assessment & Plan Note (Signed)
[facility]                          [place] OFFICE NOTE   Mikayla [name]                        MRN:          [id number]  DATE:10/01/2006                            DOB:          [birth year]    Mikayla [name] was seen November 13 complaining of pain in the right foot for  approximately 1 month.  The symptoms were worse when she was walking on her  leg and has manifested as a sharp pain.  The pain was localizing to the  right great toe, but she also had pain in her knees and left hip.  Symptoms  are essentially constant.  She had been using Arthritis Strength Tylenol and  Advil with only partial response.  She has no past medical history of  connective tissue disease; & there is no past history of arthritis in her or  her family which has been specifically diagnosed.  She has described some  nonspecific arthritic conditions in both sides of her family according to Dr  Lanell Matar records.   She has had no constitutional symptoms.  There has been no rash, uveitis/  iritis, or urethritis. She has had intermittent shortness of breath which  clinically appears related to deconditioning.   Pes planus is present.  She is tender at the base of the right great toe.  There were no skin  color or temperature abnormalities.  Crepitus was noted  in the knees. Homan's sign was negative.   She did have a rheumatoid factor and ANA performed in August of this year by  Dr. Laury Axon, and both were negative.  These were performed at the time of a  complete physical.  The review of systems was negative for any  musculoskeletal complaints, according to the office note.Mikayla [name] is unsure  of the indication for these tests.   Sed rate was 40 with normals less than 25.  Uric acid level was normal.  Films of the right great toe, knee, and hips were negative.   Celebrex 200 mg twice a day as needed was prescribed.  If her symptoms  persist, then  rheumatologic consultation would be pursued.  Clinically,  there was no suggestion of deep vein thrombosis or pulmonary thromboembolus,  but if cardiopulmonary symptoms were to manifest, then venous Doppler and CT  lung scans would be pursued.     Titus Dubin. Alwyn Ren, MD,FACP,FCCP  Electronically Signed    WFH/MedQ  DD: 10/06/2006  DT: 10/06/2006  Job #: [id number]

## 2011-04-06 NOTE — Assessment & Plan Note (Signed)
[facility]                        [place] OFFICE NOTE   [name], [name]                        MRN:          [id number]  DATE:01/22/2007                            DOB:          04-10-1966    Mikayla [name] was seen January 22, 2007 for followup of her lipids and also  to discuss recurrent infections.  She had been on Vytorin 10/20 for over  8 weeks.  She had recently returned from a prolonged stay in [place]  with her in-laws.  She states that she has been on a diet of increased  vegetables.  She has been walking 2 times a week on treadmill for 30  minutes.   She does have shortness of breath after climbing a flight of steps, but  no anginal component.   She had fever, malaise, and a urinary tract infection in [place].  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  She has had  recurrent urinary tract infections.   PAST HISTORY:  1. Hysterectomy.  2. Tonsillectomy.  3. Fibrocystic breast disease.  4. Bladder tacking.   PHYSICAL EXAMINATION:  VITAL SIGNS:  Her weight was up approximately 2  pounds to 227.  She was afebrile.  Blood pressure was 100/66.  HEENT:  The left eye was injected but had full range of motion and  normal vision.  No chemosis or conjunctivitis was present.  LYMPHATIC:  She had no lymphadenopathy about the head, neck, or axilla.  CARDIOPULMONARY:  Unremarkable.  SKIN:  Clear, and warm and dry.   Her CBC and differential, electrolyte panel, and liver function panel  were normal, with the exception of minimally reduced albumin.  Protein  supplements will be recommended.  Her LDL was 134, which was the same  level prior to starting the Vytorin.  I will recommend that she take  Vytorin 10/40.  I also recommended that she follow the Flat Belly Diet  in Prevention.[email or web address] and referred her to Dr. Gildardo Griffes book Eat, Drink, and  Be Healthy.  Nutrition consult will also be considered.   The ophthalmologic changes  were treated with Optivar.  She was  instructed in the warning signs, with recommendations for ophthalmologic  consult should she have pain, pus, or fever.     Titus Dubin. Alwyn Ren, MD,FACP,FCCP  Electronically Signed    WFH/MedQ  DD: 01/28/2007  DT: 01/28/2007  Job #: [id number]

## 2011-04-09 DIAGNOSIS — J029 Acute pharyngitis, unspecified: Secondary | ICD-10-CM | POA: Insufficient documentation

## 2011-04-09 NOTE — Assessment & Plan Note (Signed)
Apparently viral b/c there is no evidence of bacterial infxn on PE.  Reviewed supportive care and red flags that should prompt return.  Pt expressed understanding and is in agreement w/ plan.

## 2011-04-09 NOTE — Assessment & Plan Note (Signed)
Pt reports long hx of UA negative but cx + UTIs.  Start abx.  Encouraged fluids.  Reviewed supportive care and red flags that should prompt return.  Pt expressed understanding and is in agreement w/ plan.

## 2011-04-09 NOTE — Assessment & Plan Note (Signed)
Pt's poor sleep is situational.  Encouraged her to try OTC sleep aides prior to starting prescription meds.  Pt expressed understanding and is in agreement w/ plan.

## 2011-04-19 ENCOUNTER — Ambulatory Visit: Payer: BC Managed Care – PPO | Attending: Internal Medicine | Admitting: Physical Therapy

## 2011-04-19 DIAGNOSIS — IMO0001 Reserved for inherently not codable concepts without codable children: Secondary | ICD-10-CM | POA: Insufficient documentation

## 2011-04-19 DIAGNOSIS — M542 Cervicalgia: Secondary | ICD-10-CM | POA: Insufficient documentation

## 2011-04-19 DIAGNOSIS — M25519 Pain in unspecified shoulder: Secondary | ICD-10-CM | POA: Insufficient documentation

## 2011-04-24 ENCOUNTER — Ambulatory Visit: Payer: Self-pay | Attending: Internal Medicine | Admitting: Physical Therapy

## 2011-04-24 DIAGNOSIS — M542 Cervicalgia: Secondary | ICD-10-CM | POA: Insufficient documentation

## 2011-04-24 DIAGNOSIS — IMO0001 Reserved for inherently not codable concepts without codable children: Secondary | ICD-10-CM | POA: Insufficient documentation

## 2011-04-24 DIAGNOSIS — M25519 Pain in unspecified shoulder: Secondary | ICD-10-CM | POA: Insufficient documentation

## 2011-04-25 ENCOUNTER — Ambulatory Visit: Payer: Self-pay | Admitting: Physical Therapy

## 2011-05-02 ENCOUNTER — Ambulatory Visit (INDEPENDENT_AMBULATORY_CARE_PROVIDER_SITE_OTHER): Payer: BC Managed Care – PPO | Admitting: Internal Medicine

## 2011-05-02 ENCOUNTER — Encounter: Payer: Self-pay | Admitting: Internal Medicine

## 2011-05-02 VITALS — BP 118/80 | HR 64 | Temp 98.4°F | Wt 236.8 lb

## 2011-05-02 DIAGNOSIS — H15009 Unspecified scleritis, unspecified eye: Secondary | ICD-10-CM

## 2011-05-02 DIAGNOSIS — H9209 Otalgia, unspecified ear: Secondary | ICD-10-CM

## 2011-05-02 DIAGNOSIS — H9201 Otalgia, right ear: Secondary | ICD-10-CM

## 2011-05-02 MED ORDER — AMOXICILLIN-POT CLAVULANATE 875-125 MG PO TABS: 1.0000 | ORAL_TABLET | Freq: Two times a day (BID) | ORAL | Status: AC

## 2011-05-02 MED ORDER — NEOMYCIN-POLYMYXIN-HC 3.5-10000-1 OT SOLN: 3.0000 [drp] | Freq: Four times a day (QID) | OTIC | Status: AC

## 2011-05-02 NOTE — Progress Notes (Signed)
  Subjective:    Patient ID: Mikayla [name], female    DOB: [birth year], 45 y.o.   MRN: [id number]  HPI EAR PAIN  Location: right    Onset: 2 weeks ago as eyes matting in am & red   Symptoms  Sensation of fullness: yes Ear discharge: no URI symptoms: no  Fever: no    Tinnitus: no Dizziness: no Hearing loss: yes, slightly Headache: no Toothache: no Jaw click: no Rashes or lesions: no Facial muscle weakness: no  Red Flags Recent trauma: no PMH recurrent OM: yes, 1-2X/year  PMH prior ear surgery: no    Recent antibiotic usage (last 30 days):  no Diabetes or Immunosuppresion:  no      Review of Systems eyes red & matted almost every am; no itchy eyes & sneezing     Objective:   Physical Exam General appearance is of good health and nourishment; no acute distress or increased work of breathing is present.  No  lymphadenopathy about the head, neck, or axilla noted.   Eyes: No significant  conjunctival inflammation or lid edema is present. There is no scleral icterus. Mild scleritis. EOMI ; vision WNL  Ears:  External ear exam shows no significant lesions or deformities.  Otoscopic examination reveals clear canals, tympanic membranes are intact bilaterally without bulging, retraction. Very mild inflammation R TM w/o  Discharge. LTM dull. Tuning fork exam WNL  Nose:  External nasal examination shows no deformity or inflammation. Nasal mucosa are pink and moist without lesions or exudates. Slight septal dislocation to R.No obstruction to airflow.   Oral exam: Dental hygiene is good; lips and gums are healthy appearing.There is no oropharyngeal erythema or exudate noted. No TMJ clinically  Neck:  No deformities, thyromegaly, masses, or tenderness noted.   Supple with full range of motion without pain.   Heart:  Normal rate and regular rhythm. S1 and S2 normal without gallop, murmur, click, rub or other extra sounds.   Lungs:Chest clear to auscultation; no wheezes, rhonchi,rales  ,or rubs present.No increased work of breathing.    Extremities:  No cyanosis, edema, or clubbing  noted   Neuro: Romberg normal Skin: Warm & dry w/o jaundice or tenting.          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

## 2011-05-02 NOTE — Patient Instructions (Signed)
Flush eyes with Natural Tears 2-3 X / day

## 2011-05-03 ENCOUNTER — Ambulatory Visit: Payer: Self-pay | Admitting: Physical Therapy

## 2011-06-26 ENCOUNTER — Encounter: Payer: Self-pay | Admitting: Family Medicine

## 2011-06-26 ENCOUNTER — Ambulatory Visit (INDEPENDENT_AMBULATORY_CARE_PROVIDER_SITE_OTHER): Payer: BC Managed Care – PPO | Admitting: Family Medicine

## 2011-06-26 VITALS — BP 104/68 | HR 76 | Temp 97.9°F | Ht 62.75 in | Wt 240.0 lb

## 2011-06-26 DIAGNOSIS — E559 Vitamin D deficiency, unspecified: Secondary | ICD-10-CM

## 2011-06-26 DIAGNOSIS — Z0000 Encounter for general adult medical examination without abnormal findings: Secondary | ICD-10-CM

## 2011-06-26 DIAGNOSIS — K589 Irritable bowel syndrome without diarrhea: Secondary | ICD-10-CM

## 2011-06-26 LAB — BASIC METABOLIC PANEL
BUN: 16 mg/dL (ref 6–23)
CO2: 26 mEq/L (ref 19–32)
Calcium: 8.8 mg/dL (ref 8.4–10.5)
Chloride: 104 mEq/L (ref 96–112)
Creatinine, Ser: 0.6 mg/dL (ref 0.4–1.2)
GFR: 120.8 mL/min (ref >60.00)
Glucose, Bld: 81 mg/dL (ref 70–99)
Potassium: 4.1 mEq/L (ref 3.5–5.1)
Sodium: 140 mEq/L (ref 135–145)

## 2011-06-26 LAB — CBC WITH DIFFERENTIAL/PLATELET
Basophils Absolute: 0 10*3/uL (ref 0.0–0.1)
Basophils Relative: 0.4 % (ref 0.0–3.0)
Eosinophils Absolute: 0.2 10*3/uL (ref 0.0–0.7)
Eosinophils Relative: 3.9 % (ref 0.0–5.0)
HCT: 39.3 % (ref 36.0–46.0)
Hemoglobin: 13.1 g/dL (ref 12.0–15.0)
Lymphocytes Relative: 27.5 % (ref 12.0–46.0)
Lymphs Abs: 1.5 10*3/uL (ref 0.7–4.0)
MCHC: 33.4 g/dL (ref 30.0–36.0)
MCV: 92.6 fl (ref 78.0–100.0)
Monocytes Absolute: 0.4 10*3/uL (ref 0.1–1.0)
Monocytes Relative: 7.4 % (ref 3.0–12.0)
Neutro Abs: 3.4 10*3/uL (ref 1.4–7.7)
Neutrophils Relative %: 60.8 % (ref 43.0–77.0)
Platelets: 166 10*3/uL (ref 150.0–400.0)
RBC: 4.24 Mil/uL (ref 3.87–5.11)
RDW: 13 % (ref 11.5–14.6)
WBC: 5.5 10*3/uL (ref 4.5–10.5)

## 2011-06-26 LAB — HEPATIC FUNCTION PANEL
ALT: 14 U/L (ref 0–35)
AST: 17 U/L (ref 0–37)
Albumin: 3.7 g/dL (ref 3.5–5.2)
Alkaline Phosphatase: 54 U/L (ref 39–117)
Bilirubin, Direct: 0 mg/dL (ref 0.0–0.3)
Total Bilirubin: 0.5 mg/dL (ref 0.3–1.2)
Total Protein: 7.2 g/dL (ref 6.0–8.3)

## 2011-06-26 LAB — LIPID PANEL
Cholesterol: 197 mg/dL (ref 0–200)
HDL: 42.9 mg/dL (ref >39.00)
LDL Cholesterol: 129 mg/dL — ABNORMAL HIGH (ref 0–99)
Total CHOL/HDL Ratio: 5
Triglycerides: 125 mg/dL (ref 0.0–149.0)
VLDL: 25 mg/dL (ref 0.0–40.0)

## 2011-06-26 LAB — POCT URINALYSIS DIPSTICK
Bilirubin, UA: NEGATIVE
Blood, UA: NEGATIVE
Glucose, UA: NEGATIVE
Ketones, UA: NEGATIVE
Leukocytes, UA: NEGATIVE
Nitrite, UA: NEGATIVE
Protein, UA: NEGATIVE
Spec Grav, UA: 1.025
Urobilinogen, UA: 0.2
pH, UA: 5

## 2011-06-26 LAB — TSH: TSH: 0.91 u[IU]/mL (ref 0.35–5.50)

## 2011-06-26 LAB — VITAMIN B12: Vitamin B-12: 273 pg/mL (ref 211–911)

## 2011-06-26 NOTE — Progress Notes (Signed)
Subjective:     Mikayla [name] is a 45 y.o. female and is here for a comprehensive physical exam. The patient reports problems - more frequent BM and small.  Every time she eats her abd hurts and she has a BM.  Symptoms x2 months---started way before abx.     History   Social History  . Marital Status: Married    Spouse Name: N/A    Number of Children: N/A  . Years of Education: N/A   Occupational History  . Not on file.   Social History Main Topics  . Smoking status: Never Smoker   . Smokeless tobacco: Not on file  . Alcohol Use: No  . Drug Use: No  . Sexually Active: Yes -- Female partner(s)   Other Topics Concern  . Not on file   Social History Narrative  . No narrative on file   Health Maintenance  Topic Date Due  . Pap Smear  05/25/2013  . Tetanus/tdap  05/02/2020   Past Medical History  Diagnosis Date  . Adjustment disorder with anxiety   . Obesity   . Review of Systems Review of Systems  Constitutional: Negative for activity change, appetite change and fatigue.  HENT: Negative for hearing loss, congestion, tinnitus and ear discharge.  dentist q10m Eyes: Negative for visual disturbance (see optho q1y )  Respiratory: Negative for cough, chest tightness and shortness of breath.   Cardiovascular: Negative for chest pain, palpitations and leg swelling.  Gastrointestinal: Negative for abdominal pain, diarrhea, constipation and abdominal distention.  Genitourinary: Negative for urgency, frequency, decreased urine volume and difficulty urinating.  Musculoskeletal: Negative for back pain, arthralgias and gait problem.  Skin: Negative for color change, pallor and rash.  Neurological: Negative for dizziness, light-headedness, numbness and headaches.  Hematological: Negative for adenopathy. Does not bruise/bleed easily.  Psychiatric/Behavioral: Negative for suicidal ideas, confusion, sleep disturbance, self-injury, dysphoric mood, decreased concentration and agitation.       Objective:    BP 104/68  Pulse 76  Temp(Src) 97.9 F (36.6 C) (Oral)  Ht 5' 2.75" (1.594 m)  Wt 240 lb (108.863 kg)  BMI 42.85 kg/m2  SpO2 98%  General Appearance:    Alert, cooperative, no distress, appears stated age  Head:    Normocephalic, without obvious abnormality, atraumatic  Eyes:    PERRL, conjunctiva/corneas clear, EOM's intact, fundi    benign, both eyes  Ears:    Normal TM's and external ear canals, both ears  Nose:   Nares normal, septum midline, mucosa normal, no drainage    or sinus tenderness  Throat:   Lips, mucosa, and tongue normal; teeth and gums normal  Neck:   Supple, symmetrical, trachea midline, no adenopathy;    thyroid:  no enlargement/tenderness/nodules; no carotid   bruit or JVD  Back:     Symmetric, no curvature, ROM normal, no CVA tenderness  Lungs:     Clear to auscultation bilaterally, respirations unlabored  Chest Wall:    No tenderness or deformity   Heart:    Regular rate and rhythm, S1 and S2 normal, no murmur, rub   or gallop  Breast Exam:    No tenderness, masses, or nipple abnormality  Abdomen:     Soft, non-tender, bowel sounds active all four quadrants,    no masses, no organomegaly  Genitalia:    Normal female without lesion, discharge or tenderness  Rectal:    Normal tone, normal prostate, no masses or tenderness;   guaiac negative stool  Extremities:  Extremities normal, atraumatic, no cyanosis or edema  Pulses:   2+ and symmetric all extremities  Skin:   Skin color, texture, turgor normal, no rashes or lesions  Lymph nodes:   Cervical, supraclavicular, and axillary nodes normal  Neurologic:   CNII-XII intact, normal strength, sensation and reflexes    throughout   Assessment:    Healthy female exam.    Plan:    [facility] utd Check fasting labs See After Visit Summary for Counseling Recommendations

## 2011-06-26 NOTE — Patient Instructions (Signed)

## 2011-06-28 LAB — VITAMIN D 1,25 DIHYDROXY
Vitamin D 1, 25 (OH)2 Total: 59 pg/mL (ref 18–72)
Vitamin D2 1, 25 (OH)2: <8 pg/mL
Vitamin D3 1, 25 (OH)2: 59 pg/mL

## 2011-08-20 ENCOUNTER — Encounter: Payer: Self-pay | Admitting: Family Medicine

## 2011-09-25 ENCOUNTER — Encounter: Payer: Self-pay | Admitting: Family Medicine

## 2011-09-25 ENCOUNTER — Ambulatory Visit (INDEPENDENT_AMBULATORY_CARE_PROVIDER_SITE_OTHER): Payer: BC Managed Care – PPO | Admitting: Family Medicine

## 2011-09-25 VITALS — BP 108/70 | HR 82 | Temp 98.5°F | Wt 245.0 lb

## 2011-09-25 DIAGNOSIS — N390 Urinary tract infection, site not specified: Secondary | ICD-10-CM

## 2011-09-25 LAB — POCT URINALYSIS DIPSTICK
Bilirubin, UA: NEGATIVE
Glucose, UA: NEGATIVE
Ketones, UA: NEGATIVE
Nitrite, UA: NEGATIVE
Protein, UA: NEGATIVE
Spec Grav, UA: 1.015
Urobilinogen, UA: 0.2
pH, UA: 6.5

## 2011-09-25 MED ORDER — CIPROFLOXACIN HCL 500 MG PO TABS: 500.0000 mg | ORAL_TABLET | Freq: Two times a day (BID) | ORAL | Status: AC

## 2011-09-25 NOTE — Progress Notes (Signed)
  Subjective:    Mikayla [name] is a 45 y.o. female who complains of burning with urination and frequency. She has had symptoms for 5 days. Patient also complains of none. Patient denies back pain, congestion, cough, fever, headache, rhinitis, sorethroat, stomach ache and vaginal discharge. Patient does have a history of recurrent UTI. Patient does not have a history of pyelonephritis. Pt is supposed to take macrobid qd but it causes muscle aches so she stopped it about 2 weeks and then symptoms of UTI flared up.    The following portions of the patient's history were reviewed and updated as appropriate: allergies, current medications, past family history, past medical history, past social history, past surgical history and problem list.  Review of Systems Pertinent items are noted in HPI.    Objective:    BP 108/70  Pulse 82  Temp(Src) 98.5 F (36.9 C) (Oral)  Wt 245 lb (111.131 kg)  SpO2 98% General appearance: alert, cooperative, appears stated age and no distress  Laboratory:  Urine dipstick: tr for hemoglobin, trace for leukocyte esterase and negative for nitrites.   Micro exam: not done.    Assessment:    UTI  Plan:    Medications: ciprofloxacin. Maintain adequate hydration. Follow up if symptoms not improving, and as needed.

## 2011-09-25 NOTE — Patient Instructions (Signed)

## 2011-09-27 LAB — URINE CULTURE: Colony Count: >=100000

## 2011-10-08 ENCOUNTER — Other Ambulatory Visit: Payer: Self-pay | Admitting: Family Medicine

## 2011-10-08 DIAGNOSIS — N390 Urinary tract infection, site not specified: Secondary | ICD-10-CM

## 2011-10-09 ENCOUNTER — Other Ambulatory Visit: Payer: BC Managed Care – PPO

## 2011-10-09 NOTE — Progress Notes (Signed)
12  

## 2011-10-26 ENCOUNTER — Ambulatory Visit (INDEPENDENT_AMBULATORY_CARE_PROVIDER_SITE_OTHER): Payer: BC Managed Care – PPO | Admitting: Family

## 2011-10-26 ENCOUNTER — Ambulatory Visit (HOSPITAL_BASED_OUTPATIENT_CLINIC_OR_DEPARTMENT_OTHER)
Admission: RE | Admit: 2011-10-26 | Discharge: 2011-10-26 | Disposition: A | Discharge status: Home / Self Care | Payer: BC Managed Care – PPO | Source: Ambulatory Visit | Attending: Family | Admitting: Family

## 2011-10-26 ENCOUNTER — Telehealth: Payer: Self-pay | Admitting: Family

## 2011-10-26 ENCOUNTER — Encounter: Payer: Self-pay | Admitting: Family

## 2011-10-26 VITALS — BP 118/78 | HR 72 | Temp 97.8°F | Resp 16 | Wt 252.0 lb

## 2011-10-26 DIAGNOSIS — N83209 Unspecified ovarian cyst, unspecified side: Secondary | ICD-10-CM | POA: Insufficient documentation

## 2011-10-26 DIAGNOSIS — K8020 Calculus of gallbladder without cholecystitis without obstruction: Secondary | ICD-10-CM

## 2011-10-26 DIAGNOSIS — R109 Unspecified abdominal pain: Secondary | ICD-10-CM

## 2011-10-26 DIAGNOSIS — K8010 Calculus of gallbladder with chronic cholecystitis without obstruction: Secondary | ICD-10-CM | POA: Insufficient documentation

## 2011-10-26 DIAGNOSIS — N390 Urinary tract infection, site not specified: Secondary | ICD-10-CM

## 2011-10-26 LAB — POCT URINALYSIS DIPSTICK
Bilirubin, UA: NEGATIVE
Blood, UA: NEGATIVE
Glucose, UA: NEGATIVE
Ketones, UA: NEGATIVE
Leukocytes, UA: NEGATIVE
Nitrite, UA: NEGATIVE
Protein, UA: NEGATIVE
Spec Grav, UA: >=1.03
Urobilinogen, UA: 0.2
pH, UA: 5

## 2011-10-26 MED ORDER — CIPROFLOXACIN HCL 500 MG PO TABS: 500.0000 mg | ORAL_TABLET | Freq: Two times a day (BID) | ORAL | Status: AC

## 2011-10-26 NOTE — Telephone Encounter (Signed)
Reviewed CT- neg for stone.  Incidental note of ovarian cyst on the left. I spoke with patient and reviewed results.  Advised her to call us if pain worsens or does not improve. She verbalizes understanding.

## 2011-10-26 NOTE — Telephone Encounter (Signed)
Per CMA- urine sample was disposed prior to being sent for culture.  Spoke with pt.  She will complete abx and call us if her symptoms are not resolved after completion. Can reculture at that time as needed.

## 2011-10-26 NOTE — Progress Notes (Signed)
  Subjective:    Patient ID: Mikayla [name], female    DOB: [birth year], 45 y.o.   MRN: [id number]  HPI [name] is a 45 yr old female who presents today with right sided low back pain.  She reports that her right sided low back pain started dull about 10 days ago.  Has worsened x 2-3 days.  She denies associated fever or hematuria.  Mild associated nausea without vomitting, dysuria, frequency and urinary odor. Review of Systems See HPI  Past Medical History  Diagnosis Date  . Adjustment disorder with anxiety   . Obesity     History   Social History  . Marital Status: Married    Spouse Name: N/A    Number of Children: N/A  . Years of Education: N/A   Occupational History  . Not on file.   Social History Main Topics  . Smoking status: Never Smoker   . Smokeless tobacco: Not on file  . Alcohol Use: No  . Drug Use: No  . Sexually Active: Yes -- Female partner(s)   Other Topics Concern  . Not on file   Social History Narrative  . No narrative on file    Past Surgical History  Procedure Date  . Breast cyst excision     left  . Bladder suspension 04/2009  . Partial hysterectomy     no oophorectomy  . Tonsillectomy   . Bladder strap 2010  . Vaginal wound closure / repair 2010    anterior    Family History  Problem Relation Age of Onset  . Heart attack Mother   . Coronary artery disease Mother     triple bypass age 8  . Diabetes Maternal Grandmother   . Lung cancer Mother   . Arthritis      both sides of family  . Depression      both sides of family  . Alcohol abuse      both sides of family  . Coronary artery disease Father     No Known Allergies  Current Outpatient Prescriptions on File Prior to Visit  Medication Sig Dispense Refill  . LORazepam (ATIVAN) 0.5 MG tablet Take 0.5 mg by mouth as needed.       . traMADol (ULTRAM) 50 MG tablet Take 1 tablet (50 mg total) by mouth every 6 (six) hours as needed for pain.  30 tablet  0    BP 118/78  Pulse 72   Temp(Src) 97.8 F (36.6 C) (Oral)  Resp 16  Wt 252 lb (114.306 kg)       Objective:   Physical Exam  Constitutional: She appears well-developed and well-nourished. No distress.  HENT:  Head: Normocephalic and atraumatic.  Mouth/Throat: No oropharyngeal exudate.  Neck: Normal range of motion. Neck supple.  Cardiovascular: Normal rate and regular rhythm.   No murmur heard. Pulmonary/Chest: Effort normal and breath sounds normal. No respiratory distress. She has no wheezes. She has no rales. She exhibits no tenderness.  Abdominal: Soft. Bowel sounds are normal. She exhibits no distension and no mass. There is no tenderness. There is no rebound and no guarding.  Genitourinary:       + R CVAT          Assessment & Plan:

## 2011-10-26 NOTE — Patient Instructions (Signed)
Please complete your CT on the first floor.  Call if worsening low back pain, blood in urine, or fever greater than 101. Call if symptoms are not improved in 2-3 days.  Start Cipro.

## 2011-10-28 DIAGNOSIS — R109 Unspecified abdominal pain: Secondary | ICD-10-CM | POA: Insufficient documentation

## 2011-10-28 DIAGNOSIS — N390 Urinary tract infection, site not specified: Secondary | ICD-10-CM | POA: Insufficient documentation

## 2011-10-28 NOTE — Assessment & Plan Note (Signed)
A CT of the abdomen and pelvis was obtained to rule out nephrolithiasis and is negative.  I suspect that flank pain is likely musculoskeletal in nature.

## 2011-10-28 NOTE — Assessment & Plan Note (Signed)
Pt with hx of recurrent UTI and symptoms consistent with UTI.  UA is unremarkable.  Will plan to treat with cipro.

## 2011-11-07 ENCOUNTER — Other Ambulatory Visit: Payer: Self-pay | Admitting: Family Medicine

## 2011-11-07 NOTE — Telephone Encounter (Signed)
Last seen 09/25/11 and filled 05/23/11 # 30. Please advise    KP

## 2011-11-08 MED ORDER — LORAZEPAM 0.5 MG PO TABS: 0.5000 mg | ORAL_TABLET | ORAL | Status: DC | PRN

## 2011-11-27 ENCOUNTER — Encounter: Payer: Self-pay | Admitting: Family

## 2011-11-27 ENCOUNTER — Ambulatory Visit (INDEPENDENT_AMBULATORY_CARE_PROVIDER_SITE_OTHER): Payer: BC Managed Care – PPO | Admitting: Family

## 2011-11-27 VITALS — BP 112/80 | HR 84 | Temp 98.7°F | Resp 16 | Wt 249.1 lb

## 2011-11-27 DIAGNOSIS — R509 Fever, unspecified: Secondary | ICD-10-CM

## 2011-11-27 DIAGNOSIS — IMO0001 Reserved for inherently not codable concepts without codable children: Secondary | ICD-10-CM

## 2011-11-27 DIAGNOSIS — F32A Depression, unspecified: Secondary | ICD-10-CM

## 2011-11-27 DIAGNOSIS — R300 Dysuria: Secondary | ICD-10-CM

## 2011-11-27 DIAGNOSIS — R61 Generalized hyperhidrosis: Secondary | ICD-10-CM

## 2011-11-27 DIAGNOSIS — R635 Abnormal weight gain: Secondary | ICD-10-CM

## 2011-11-27 DIAGNOSIS — Z23 Encounter for immunization: Secondary | ICD-10-CM

## 2011-11-27 DIAGNOSIS — F329 Major depressive disorder, single episode, unspecified: Secondary | ICD-10-CM

## 2011-11-27 DIAGNOSIS — N390 Urinary tract infection, site not specified: Secondary | ICD-10-CM

## 2011-11-27 LAB — POCT URINALYSIS DIPSTICK
Bilirubin, UA: NEGATIVE
Blood, UA: NEGATIVE
Glucose, UA: NEGATIVE
Ketones, UA: NEGATIVE
Leukocytes, UA: NEGATIVE
Nitrite, UA: NEGATIVE
Protein, UA: NEGATIVE
Spec Grav, UA: 1.025
Urobilinogen, UA: 0.2
pH, UA: 5

## 2011-11-27 LAB — CBC WITH DIFFERENTIAL/PLATELET
Basophils Absolute: 0 10*3/uL (ref 0.0–0.1)
Basophils Relative: 0 % (ref 0–1)
Eosinophils Absolute: 0.1 10*3/uL (ref 0.0–0.7)
Eosinophils Relative: 2 % (ref 0–5)
HCT: 40.8 % (ref 36.0–46.0)
Hemoglobin: 13.2 g/dL (ref 12.0–15.0)
Lymphocytes Relative: 30 % (ref 12–46)
Lymphs Abs: 2 10*3/uL (ref 0.7–4.0)
MCH: 30.1 pg (ref 26.0–34.0)
MCHC: 32.4 g/dL (ref 30.0–36.0)
MCV: 93.2 fL (ref 78.0–100.0)
Monocytes Absolute: 0.6 10*3/uL (ref 0.1–1.0)
Monocytes Relative: 10 % (ref 3–12)
Neutro Abs: 3.8 10*3/uL (ref 1.7–7.7)
Neutrophils Relative %: 58 % (ref 43–77)
Platelets: 214 10*3/uL (ref 150–400)
RBC: 4.38 MIL/uL (ref 3.87–5.11)
RDW: 12.6 % (ref 11.5–15.5)
WBC: 6.6 10*3/uL (ref 4.0–10.5)

## 2011-11-27 LAB — TSH: TSH: 2.471 u[IU]/mL (ref 0.350–4.500)

## 2011-11-27 MED ORDER — BUPROPION HCL ER (SR) 150 MG PO TB12: 150.0000 mg | ORAL_TABLET | Freq: Two times a day (BID) | ORAL | Status: DC

## 2011-11-27 NOTE — Assessment & Plan Note (Addendum)
I do think that she has some depression going on and could benefit from treatment. She denies hx of suicidal ideation- though she is instructed to go to the ED if this does occur.  Will give trial of Wellbutrin. She is instructed to start once daily for 3 days then increase to BID. We also discussed the importance of trying to find some work/life balance.

## 2011-11-27 NOTE — Patient Instructions (Signed)
Please complete your blood work prior to leaving.  Follow up in 1 month, call if fever >100.

## 2011-11-27 NOTE — Assessment & Plan Note (Addendum)
Discussed starting exercise routine such as walking with a goal of 30 minutes 5 days a week. We also discussed healthy balanced diet with an initial calorie goal of 1500. She may need to drop down to 1200 if she is not seeing good results. She will follow up in 1 month. 35 minutes spent with patient today.  >50% of this time was spent counseling pt on her depression, diet, exercise and weight loss.

## 2011-11-27 NOTE — Progress Notes (Signed)
Subjective:    Patient ID: Mikayla [name], female    DOB: 1966-09-23, 46 y.o.   MRN: [id number]  HPI  Ms.  Name is a 46 yr old female who presents today with several concerns.  She reports 3 week history of "fevers" at night.  Wakes up sweating, though has not taken her temperature. She notes that she is continuing antibiotics for her urine.  Denies significant sinus congestion.  Notes some itching in her ears.  Denies recent travel.  Weight loss- reports that she has gained 50 pounds this year. 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  She is not exercising.    Stress/depression- reports that she has been working 6-7 days a week. 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  She notes that for the last 6 months she has not had any motivation or taken pleasure in any of the activities that she used to enjoy.  She is not sleeping well. She hopes to train someone to help relieve some of her work burden.    Review of Systems See HPI  Past Medical History  Diagnosis Date  . Adjustment disorder with anxiety   . Obesity     History   Social History  . Marital Status: Married    Spouse Name: N/A    Number of Children: N/A  . Years of Education: N/A   Occupational History  . Not on file.   Social History Main Topics  . Smoking status: Never Smoker   . Smokeless tobacco: Not on file  . Alcohol Use: No  . Drug Use: No  . Sexually Active: Yes -- Female partner(s)   Other Topics Concern  . Not on file   Social History Narrative  . No narrative on file    Past Surgical History  Procedure Date  . Breast cyst excision     left  . Bladder suspension 04/2009  . Partial hysterectomy     no oophorectomy  . Tonsillectomy   . Bladder strap 2010  . Vaginal wound closure / repair 2010    anterior    Family History  Problem Relation Age of Onset  . Heart attack Mother   . Coronary artery disease Mother     triple bypass  age 45  . Diabetes Maternal Grandmother   . Lung cancer Mother   . Arthritis      both sides of family  . Depression      both sides of family  . Alcohol abuse      both sides of family  . Coronary artery disease Father     No Known Allergies  Current Outpatient Prescriptions on File Prior to Visit  Medication Sig Dispense Refill  . LORazepam (ATIVAN) 0.5 MG tablet Take 1 tablet (0.5 mg total) by mouth as needed.  30 tablet  2  . traMADol (ULTRAM) 50 MG tablet Take 1 tablet (50 mg total) by mouth every 6 (six) hours as needed for pain.  30 tablet  0    BP 112/80  Pulse 84  Temp(Src) 98.7 F (37.1 C) (Oral)  Resp 16  Wt 249 lb 1.3 oz (112.982 kg)       Objective:   Physical Exam  Constitutional: She appears well-developed and well-nourished. No distress.  HENT:  Head: Normocephalic and atraumatic.  Neck: No thyromegaly present.  Cardiovascular: Normal rate and regular rhythm.  No murmur heard. Pulmonary/Chest: Effort normal.  Psychiatric: Her behavior is normal. Judgment and thought content normal. Her mood appears not anxious. Her speech is not rapid and/or pressured. Cognition and memory are normal.       Pt became tearful during interview.            Assessment & Plan:

## 2011-11-27 NOTE — Assessment & Plan Note (Signed)
She is currently on macrobid.  Dip is negative. Continue macrobid.

## 2011-11-27 NOTE — Assessment & Plan Note (Addendum)
Perimenopause is a possibility.  Also need to check Thyroid and CBC. She has no documented fevers- has just "felt hot." I have asked her to take her temp and call us if >100.

## 2011-11-29 ENCOUNTER — Encounter: Payer: Self-pay | Admitting: Family

## 2011-11-29 LAB — URINE CULTURE: Colony Count: 30000

## 2011-12-25 ENCOUNTER — Encounter: Payer: Self-pay | Admitting: Family

## 2011-12-25 ENCOUNTER — Ambulatory Visit (INDEPENDENT_AMBULATORY_CARE_PROVIDER_SITE_OTHER): Payer: BC Managed Care – PPO | Admitting: Family

## 2011-12-25 DIAGNOSIS — F32A Depression, unspecified: Secondary | ICD-10-CM

## 2011-12-25 DIAGNOSIS — M25511 Pain in right shoulder: Secondary | ICD-10-CM | POA: Insufficient documentation

## 2011-12-25 DIAGNOSIS — F329 Major depressive disorder, single episode, unspecified: Secondary | ICD-10-CM

## 2011-12-25 DIAGNOSIS — M25519 Pain in unspecified shoulder: Secondary | ICD-10-CM

## 2011-12-25 MED ORDER — BUPROPION HCL ER (SR) 200 MG PO TB12: 200.0000 mg | ORAL_TABLET | Freq: Two times a day (BID) | ORAL | Status: DC

## 2011-12-25 MED ORDER — LORAZEPAM 0.5 MG PO TABS: 0.5000 mg | ORAL_TABLET | ORAL | Status: DC | PRN

## 2011-12-25 MED ORDER — MELOXICAM 7.5 MG PO TABS: 7.5000 mg | ORAL_TABLET | Freq: Every day | ORAL | Status: DC

## 2011-12-25 NOTE — Patient Instructions (Signed)
Please follow up in 3 months, sooner if problems or concerns. 
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
## 2011-12-25 NOTE — Assessment & Plan Note (Signed)
Improving.  She does not feel that it is optimally controlled though. Will try increasing wellbutrin to 200mg  bid.

## 2011-12-25 NOTE — Assessment & Plan Note (Signed)
Weight is unchanged. She is working hard on exercise and smaller portions.  I encouraged her to keep up the good work.

## 2011-12-25 NOTE — Assessment & Plan Note (Signed)
Will give patient trial of meloxicam.  If no improvement plan referral to sports medicine.

## 2012-02-16 ENCOUNTER — Emergency Department (HOSPITAL_COMMUNITY)
Admission: EM | Admit: 2012-02-16 | Discharge: 2012-02-16 | Disposition: A | Discharge status: Home / Self Care | Payer: BC Managed Care – PPO | Source: Home / Self Care

## 2012-02-16 ENCOUNTER — Encounter (HOSPITAL_COMMUNITY): Payer: Self-pay | Admitting: *Deleted

## 2012-02-16 DIAGNOSIS — J069 Acute upper respiratory infection, unspecified: Secondary | ICD-10-CM

## 2012-02-16 MED ORDER — ALBUTEROL SULFATE HFA 108 (90 BASE) MCG/ACT IN AERS: 1.0000 | INHALATION_SPRAY | Freq: Four times a day (QID) | RESPIRATORY_TRACT | Status: DC | PRN

## 2012-02-16 MED ORDER — HYDROCOD POLST-CHLORPHEN POLST 10-8 MG/5ML PO LQCR: 5.0000 mL | Freq: Two times a day (BID) | ORAL | Status: DC | PRN

## 2012-02-16 NOTE — ED Notes (Signed)
Pt with c/o cough/congestion and fever x 3 days - productive cough - c/o headache

## 2012-02-16 NOTE — ED Provider Notes (Signed)
Medical screening examination/treatment/procedure(s) were performed by non-physician practitioner and as supervising physician I was immediately available for consultation/collaboration.  Alen Bleacher, MD 02/16/12 [phone]

## 2012-02-16 NOTE — Discharge Instructions (Signed)
Try over the counter medicines that have sudafed (pseudoephdrine) and mucinex (guaifenisen) in them.  Generic medicines are ok to use.  Also use a neti pot.  Use only distilled water and salt without iodine in it (either kosher or sea salt) in the neti pot.  Some neti pots also now come with premeasured salt packets that you can use.  If you mix your own saline, the recipe is 1 cup warm water to 1/4 teaspoon salt.  You can use the neti pot as many times a day as you need to help relieve your symptoms.  Drink LOTS of liquids.  Upper Respiratory Infection, Adult An upper respiratory infection (URI) is also sometimes known as the common cold. The upper respiratory tract includes the nose, sinuses, throat, trachea, and bronchi. Bronchi are the airways leading to the lungs. Most people improve within 1 week, but symptoms can last up to 2 weeks. A residual cough may last even longer.  CAUSES Many different viruses can infect the tissues lining the upper respiratory tract. The tissues become irritated and inflamed and often become very moist. Mucus production is also common. A cold is contagious. You can easily spread the virus to others by oral contact. This includes kissing, sharing a glass, coughing, or sneezing. Touching your mouth or nose and then touching a surface, which is then touched by another person, can also spread the virus. SYMPTOMS  Symptoms typically develop 1 to 3 days after you come in contact with a cold virus. Symptoms vary from person to person. They may include:  Runny nose.   Sneezing.   Nasal congestion.   Sinus irritation.   Sore throat.   Loss of voice (laryngitis).   Cough.   Fatigue.   Muscle aches.   Loss of appetite.   Headache.   Low-grade fever.  DIAGNOSIS  You might diagnose your own cold based on familiar symptoms, since most people get a cold 2 to 3 times a year. Your caregiver can confirm this based on your exam. Most importantly, your caregiver can  check that your symptoms are not due to another disease such as strep throat, sinusitis, pneumonia, asthma, or epiglottitis. Blood tests, throat tests, and X-rays are not necessary to diagnose a common cold, but they may sometimes be helpful in excluding other more serious diseases. Your caregiver will decide if any further tests are required. RISKS AND COMPLICATIONS  You may be at risk for a more severe case of the common cold if you smoke cigarettes, have chronic heart disease (such as heart failure) or lung disease (such as asthma), or if you have a weakened immune system. The very young and very old are also at risk for more serious infections. Bacterial sinusitis, middle ear infections, and bacterial pneumonia can complicate the common cold. The common cold can worsen asthma and chronic obstructive pulmonary disease (COPD). Sometimes, these complications can require emergency medical care and may be life-threatening. PREVENTION  The best way to protect against getting a cold is to practice good hygiene. Avoid oral or hand contact with people with cold symptoms. Wash your hands often if contact occurs. There is no clear evidence that vitamin C, vitamin E, echinacea, or exercise reduces the chance of developing a cold. However, it is always recommended to get plenty of rest and practice good nutrition. TREATMENT  Treatment is directed at relieving symptoms. There is no cure. Antibiotics are not effective, because the infection is caused by a virus, not by bacteria. Treatment may include:  Increased fluid intake. Sports drinks offer valuable electrolytes, sugars, and fluids.   Breathing heated mist or steam (vaporizer or shower).   Eating chicken soup or other clear broths, and maintaining good nutrition.   Getting plenty of rest.   Using gargles or lozenges for comfort.   Controlling fevers with ibuprofen or acetaminophen as directed by your caregiver.   Increasing usage of your inhaler if you  have asthma.  Zinc gel and zinc lozenges, taken in the first 24 hours of the common cold, can shorten the duration and lessen the severity of symptoms. Pain medicines may help with fever, muscle aches, and throat pain. A variety of non-prescription medicines are available to treat congestion and runny nose. Your caregiver can make recommendations and may suggest nasal or lung inhalers for other symptoms.  HOME CARE INSTRUCTIONS   Only take over-the-counter or prescription medicines for pain, discomfort, or fever as directed by your caregiver.   Use a warm mist humidifier or inhale steam from a shower to increase air moisture. This may keep secretions moist and make it easier to breathe.   Drink enough water and fluids to keep your urine clear or pale yellow.   Rest as needed.   Return to work when your temperature has returned to normal or as your caregiver advises. You may need to stay home longer to avoid infecting others. You can also use a face mask and careful hand washing to prevent spread of the virus.  SEEK MEDICAL CARE IF:   After the first few days, you feel you are getting worse rather than better.   You need your caregiver's advice about medicines to control symptoms.   You develop chills, worsening shortness of breath, or brown or red sputum. These may be signs of pneumonia.   You develop yellow or brown nasal discharge or pain in the face, especially when you bend forward. These may be signs of sinusitis.   You develop a fever, swollen neck glands, pain with swallowing, or white areas in the back of your throat. These may be signs of strep throat.  SEEK IMMEDIATE MEDICAL CARE IF:   You have a fever.   You develop severe or persistent headache, ear pain, sinus pain, or chest pain.   You develop wheezing, a prolonged cough, cough up blood, or have a change in your usual mucus (if you have chronic lung disease).   You develop sore muscles or a stiff neck.  Document  Released: 05/01/2001 Document Revised: 10/25/2011 Document Reviewed: 03/09/2011 [facility] Patient Information 2012 [place], [place].

## 2012-02-16 NOTE — ED Provider Notes (Signed)
History     CSN: [id number]  Arrival date & time 02/16/12  1347   None     Chief Complaint  Patient presents with  . Nasal Congestion  . Fever  . Cough    (Consider location/radiation/quality/duration/timing/severity/associated sxs/prior treatment) HPI Comments: Pt with cough, nasal congestion for 3 days.  Worse at night, feels like is "wheezing" at night.  Took temp and had fever of 100.2 last night.    Patient is a 46 y.o. female presenting with fever and cough. The history is provided by the patient.  Fever Primary symptoms of the febrile illness include fever, cough and wheezing. Primary symptoms do not include rash. The current episode started 3 to 5 days ago. This is a new problem. The problem has not changed since onset. The fever began yesterday. The maximum temperature recorded prior to her arrival was 100 to 100.9 F.  The cough began 3 to 5 days ago. The cough is productive. The sputum is yellow.  Wheezing began 2 days ago. Wheezing occurs rarely. The patient's medical history does not include asthma or COPD.  Cough Associated symptoms include wheezing. Pertinent negatives include no chills, no ear pain, no rhinorrhea and no sore throat. Her past medical history does not include COPD or asthma.    Past Medical History  Diagnosis Date  . Adjustment disorder with anxiety   . Obesity     Past Surgical History  Procedure Date  . Breast cyst excision     left  . Bladder suspension 04/2009  . Partial hysterectomy     no oophorectomy  . Tonsillectomy   . Bladder strap 2010  . Vaginal wound closure / repair 2010    anterior    Family History  Problem Relation Age of Onset  . Heart attack Mother   . Coronary artery disease Mother     triple bypass age 14  . Diabetes Maternal Grandmother   . Lung cancer Mother   . Arthritis      both sides of family  . Depression      both sides of family  . Alcohol abuse      both sides of family  . Coronary artery disease  Father     History  Substance Use Topics  . Smoking status: Never Smoker   . Smokeless tobacco: Not on file  . Alcohol Use: No    OB History    Grav Para Term Preterm Abortions TAB SAB Ect Mult Living                  Review of Systems  Constitutional: Positive for fever. Negative for chills.  HENT: Positive for congestion, postnasal drip and sinus pressure. Negative for ear pain, sore throat and rhinorrhea.   Respiratory: Positive for cough and wheezing.   Skin: Negative for rash.    Allergies  Review of patient's allergies indicates no known allergies.  Home Medications   Current Outpatient Rx  Name Route Sig Dispense Refill  . IBUPROFEN 200 MG PO TABS Oral Take 400 mg by mouth every 6 (six) hours as needed.    Marland Kitchen LORAZEPAM 0.5 MG PO TABS Oral Take 1 tablet (0.5 mg total) by mouth as needed. 30 tablet 0  . ALBUTEROL SULFATE HFA 108 (90 BASE) MCG/ACT IN AERS Inhalation Inhale 1-2 puffs into the lungs every 6 (six) hours as needed for wheezing. 1 Inhaler 0  . BUPROPION HCL ER (SR) 200 MG PO TB12 Oral Take 1 tablet (200  mg total) by mouth 2 (two) times daily. 60 tablet 3  . HYDROCOD POLST-CPM POLST ER 10-8 MG/5ML PO LQCR Oral Take 5 mLs by mouth every 12 (twelve) hours as needed. 140 mL 0  . MELOXICAM 7.5 MG PO TABS Oral Take 1 tablet (7.5 mg total) by mouth daily. 15 tablet 0  . NITROFURANTOIN MACROCRYSTAL 100 MG PO CAPS Oral Take 1 tablet by mouth daily.    Marland Kitchen PHENAZOPYRIDINE HCL 200 MG PO TABS Oral Take 1 tablet by mouth as needed.    Marland Kitchen TRAMADOL HCL 50 MG PO TABS Oral Take 1 tablet (50 mg total) by mouth every 6 (six) hours as needed for pain. 30 tablet 0    BP 107/71  Pulse 91  Temp(Src) 98.3 F (36.8 C) (Oral)  Resp 18  SpO2 95%  Physical Exam  Constitutional: She appears well-developed and well-nourished. No distress.  HENT:  Right Ear: External ear and ear canal normal. Tympanic membrane is retracted.  Left Ear: External ear and ear canal normal. Tympanic  membrane is retracted.  Nose: Mucosal edema present.  Mouth/Throat: Uvula is midline, oropharynx is clear and moist and mucous membranes are normal.  Cardiovascular: Normal rate and regular rhythm.   Pulmonary/Chest: Effort normal and breath sounds normal.       Coughing rarely during exam  Lymphadenopathy:       Head (right side): No submandibular adenopathy present.       Head (left side): No submandibular adenopathy present.    She has no cervical adenopathy.    ED Course  Procedures (including critical care time)  Labs Reviewed - No data to display No results found.   1. Upper respiratory infection       MDM          [name], NP 02/16/12 [id number]

## 2012-03-05 ENCOUNTER — Other Ambulatory Visit: Payer: Self-pay | Admitting: Family Medicine

## 2012-03-05 NOTE — Telephone Encounter (Signed)
Refill for Furosemide 40MG  Tablet Qty 30  Take 1/2 to 1 tablet as needed Last filled 3.11.12 &  Lorazepam 0.5MG   Take 1-tablet by mouth as needed   Last written 2.5.13 Sandford Craze)  Last OV 11.6.12 (with Lowne)

## 2012-03-12 MED ORDER — FUROSEMIDE 40 MG PO TABS: 40.0000 mg | ORAL_TABLET | ORAL | Status: DC

## 2012-03-12 NOTE — Telephone Encounter (Signed)
OK on both x 1

## 2012-03-12 NOTE — Telephone Encounter (Signed)
Last filled 12/25/11 # 30. Please advise    KP

## 2012-03-13 MED ORDER — LORAZEPAM 0.5 MG PO TABS: 0.5000 mg | ORAL_TABLET | ORAL | Status: DC | PRN

## 2012-03-13 MED ORDER — FUROSEMIDE 40 MG PO TABS: 40.0000 mg | ORAL_TABLET | ORAL | Status: DC

## 2012-03-13 NOTE — Telephone Encounter (Signed)
Rx sent 

## 2012-06-16 ENCOUNTER — Encounter (HOSPITAL_BASED_OUTPATIENT_CLINIC_OR_DEPARTMENT_OTHER): Payer: Self-pay | Admitting: Emergency Medicine

## 2012-06-16 ENCOUNTER — Emergency Department (HOSPITAL_BASED_OUTPATIENT_CLINIC_OR_DEPARTMENT_OTHER)
Admission: EM | Admit: 2012-06-16 | Discharge: 2012-06-17 | Disposition: A | Discharge status: Home / Self Care | Payer: BC Managed Care – PPO | Attending: Emergency Medicine | Admitting: Emergency Medicine

## 2012-06-16 DIAGNOSIS — K2990 Gastroduodenitis, unspecified, without bleeding: Secondary | ICD-10-CM | POA: Insufficient documentation

## 2012-06-16 DIAGNOSIS — K2970 Gastritis, unspecified, without bleeding: Secondary | ICD-10-CM

## 2012-06-16 LAB — CBC WITH DIFFERENTIAL/PLATELET
Basophils Absolute: 0 10*3/uL (ref 0.0–0.1)
Basophils Relative: 0 % (ref 0–1)
Eosinophils Absolute: 0.1 10*3/uL (ref 0.0–0.7)
Eosinophils Relative: 1 % (ref 0–5)
HCT: 40.8 % (ref 36.0–46.0)
Hemoglobin: 13.8 g/dL (ref 12.0–15.0)
Lymphocytes Relative: 3 % — ABNORMAL LOW (ref 12–46)
Lymphs Abs: 0.3 10*3/uL — ABNORMAL LOW (ref 0.7–4.0)
MCH: 31.3 pg (ref 26.0–34.0)
MCHC: 33.8 g/dL (ref 30.0–36.0)
MCV: 92.5 fL (ref 78.0–100.0)
Monocytes Absolute: 0.3 10*3/uL (ref 0.1–1.0)
Monocytes Relative: 3 % (ref 3–12)
Neutro Abs: 9.3 10*3/uL — ABNORMAL HIGH (ref 1.7–7.7)
Neutrophils Relative %: 93 % — ABNORMAL HIGH (ref 43–77)
Platelets: 170 10*3/uL (ref 150–400)
RBC: 4.41 MIL/uL (ref 3.87–5.11)
RDW: 12.8 % (ref 11.5–15.5)
WBC: 10 10*3/uL (ref 4.0–10.5)

## 2012-06-16 LAB — COMPREHENSIVE METABOLIC PANEL
ALT: 12 U/L (ref 0–35)
AST: 15 U/L (ref 0–37)
Albumin: 4 g/dL (ref 3.5–5.2)
Alkaline Phosphatase: 70 U/L (ref 39–117)
BUN: 20 mg/dL (ref 6–23)
CO2: 24 mEq/L (ref 19–32)
Calcium: 9.5 mg/dL (ref 8.4–10.5)
Chloride: 101 mEq/L (ref 96–112)
Creatinine, Ser: 0.5 mg/dL (ref 0.50–1.10)
GFR calc Af Amer: >90 mL/min (ref >90)
GFR calc non Af Amer: >90 mL/min (ref >90)
Glucose, Bld: 125 mg/dL — ABNORMAL HIGH (ref 70–99)
Potassium: 4 mEq/L (ref 3.5–5.1)
Sodium: 136 mEq/L (ref 135–145)
Total Bilirubin: 0.4 mg/dL (ref 0.3–1.2)
Total Protein: 7.6 g/dL (ref 6.0–8.3)

## 2012-06-16 LAB — URINALYSIS, ROUTINE W REFLEX MICROSCOPIC
Glucose, UA: NEGATIVE mg/dL
Hgb urine dipstick: NEGATIVE
Ketones, ur: >80 mg/dL — AB
Nitrite: NEGATIVE
Protein, ur: NEGATIVE mg/dL
Specific Gravity, Urine: 1.035 — ABNORMAL HIGH (ref 1.005–1.030)
Urobilinogen, UA: 1 mg/dL (ref 0.0–1.0)
pH: 5.5 (ref 5.0–8.0)

## 2012-06-16 LAB — URINE MICROSCOPIC-ADD ON

## 2012-06-16 LAB — LIPASE, BLOOD: Lipase: 16 U/L (ref 11–59)

## 2012-06-16 MED ORDER — SODIUM CHLORIDE 0.9 % IV BOLUS (SEPSIS)
1000.0000 mL | Freq: Once | INTRAVENOUS | Status: AC
  Administered 2012-06-16: 1000 mL via INTRAVENOUS

## 2012-06-16 MED ORDER — ONDANSETRON HCL 4 MG/2ML IJ SOLN
4.0000 mg | Freq: Once | INTRAMUSCULAR | Status: AC
  Administered 2012-06-16: 4 mg via INTRAVENOUS
  Filled 2012-06-16: qty 2

## 2012-06-16 NOTE — ED Provider Notes (Signed)
History    This chart was scribed for Mikayla Chick, MD, MD by Smitty Pluck. The patient was seen in room MH07 and the patient's care was started at 11:12PM.   CSN: [id number]  Arrival date & time 06/16/12  2220   First MD Initiated Contact with Patient 06/16/12 2305      Chief Complaint  Patient presents with  . Emesis    (Consider location/radiation/quality/duration/timing/severity/associated sxs/prior treatment) The history is provided by the patient.   Mikayla [name] is a 46 y.o. female who presents to the Emergency Department complaining of moderate, constant nausea and vomiting onset today around 2-3PM. Pt reports feeling tightness and pressure in stomach. Pt has vomited every hour since onset. Pt reports that vomit was initially food contact but now is liquid content from Gatorade that she had earlier. Reports her BP was low today and she felt weak. Pt denies dysuria, fever, back pain and diarrhea. Denies any chronic medical problems. Pt still has gallbladder. There are no other associated systemic symptoms, there are no other alleviating or modifying factors.   Past Medical History  Diagnosis Date  . Adjustment disorder with anxiety   . Obesity     Past Surgical History  Procedure Date  . Breast cyst excision     left  . Bladder suspension 04/2009  . Partial hysterectomy     no oophorectomy  . Tonsillectomy   . Bladder strap 2010  . Vaginal wound closure / repair 2010    anterior    Family History  Problem Relation Age of Onset  . Heart attack Mother   . Coronary artery disease Mother     triple bypass age 36  . Diabetes Maternal Grandmother   . Lung cancer Mother   . Arthritis      both sides of family  . Depression      both sides of family  . Alcohol abuse      both sides of family  . Coronary artery disease Father     History  Substance Use Topics  . Smoking status: Never Smoker   . Smokeless tobacco: Not on file  . Alcohol Use: No    OB  History    Grav Para Term Preterm Abortions TAB SAB Ect Mult Living                  Review of Systems  All other systems reviewed and are negative.  10 Systems reviewed and all are negative for acute change except as noted in the HPI.    Allergies  Review of patient's allergies indicates no known allergies.  Home Medications   Current Outpatient Rx  Name Route Sig Dispense Refill  . ONDANSETRON HCL 4 MG PO TABS Oral Take 1 tablet (4 mg total) by mouth every 6 (six) hours. 12 tablet 0    BP 116/81  Pulse 85  Temp 97.9 F (36.6 C) (Oral)  Resp 17  Ht 5\' 3"  (1.6 m)  Wt 219 lb (99.338 kg)  BMI 38.79 kg/m2  SpO2 97% Physical Exam  Nursing note and vitals reviewed. Constitutional: She is oriented to person, place, and time. She appears well-developed and well-nourished. No distress.  HENT:  Head: Normocephalic and atraumatic.       mucous membranes are dry   Eyes: Conjunctivae are normal.  Neck: Normal range of motion. Neck supple.  Cardiovascular: Normal rate, regular rhythm and normal heart sounds.   Pulmonary/Chest: Effort normal and breath sounds normal. No  respiratory distress.  Abdominal: There is no tenderness. There is no CVA tenderness.  Neurological: She is alert and oriented to person, place, and time.  Skin: Skin is warm and dry.  Psychiatric: She has a normal mood and affect. Her behavior is normal.  Note- NABS, abdominal nondistended, soft  ED Course  Procedures (including critical care time) DIAGNOSTIC STUDIES: Oxygen Saturation is 97% on room air, normal by my interpretation.    COORDINATION OF CARE: 11:17PM EDP discusses pt ED treatment with pt  11:17PM EDP ordered medication:     Labs Reviewed  CBC WITH DIFFERENTIAL - Abnormal; Notable for the following:    Neutrophils Relative 93 (*)     Neutro Abs 9.3 (*)     Lymphocytes Relative 3 (*)     Lymphs Abs 0.3 (*)     All other components within normal limits  COMPREHENSIVE METABOLIC PANEL -  Abnormal; Notable for the following:    Glucose, Bld 125 (*)     All other components within normal limits  URINALYSIS, ROUTINE W REFLEX MICROSCOPIC - Abnormal; Notable for the following:    Specific Gravity, Urine 1.035 (*)     Bilirubin Urine SMALL (*)     Ketones, ur >80 (*)     Leukocytes, UA SMALL (*)     All other components within normal limits  URINE MICROSCOPIC-ADD ON - Abnormal; Notable for the following:    Squamous Epithelial / LPF FEW (*)     All other components within normal limits  LIPASE, BLOOD   No results found.   1. Gastritis       MDM  Pt presents with c/o vomiting.  Labs reassuring, does have > 80 ketones in urine. Pt hydrated, given IV antiemetics with good control of symptoms.  She is requesting to be discharged home.  Abdominal exam benign.  Discharged with strict return precautions, she is in agreement with this plan.   I personally performed the services described in this documentation, which was scribed in my presence. The recorded information has been reviewed and considered.        Mikayla Chick, MD 06/19/12 1130

## 2012-06-16 NOTE — ED Notes (Signed)
Nausea started at noon.  Vomiting every hour since 2pm. Minimal stomach discomfort. Weak, chills.

## 2012-06-17 MED ORDER — PROMETHAZINE HCL 25 MG/ML IJ SOLN
25.0000 mg | Freq: Once | INTRAMUSCULAR | Status: AC
  Administered 2012-06-17: 25 mg via INTRAVENOUS
  Filled 2012-06-17: qty 1

## 2012-06-17 MED ORDER — ONDANSETRON HCL 4 MG PO TABS: 4.0000 mg | ORAL_TABLET | Freq: Four times a day (QID) | ORAL | Status: AC

## 2012-06-17 MED ORDER — HYDROMORPHONE HCL PF 1 MG/ML IJ SOLN: 1.0000 mg | Freq: Once | INTRAMUSCULAR | Status: DC

## 2012-06-17 MED ORDER — DIPHENHYDRAMINE HCL 50 MG/ML IJ SOLN
INTRAMUSCULAR | Status: AC
  Administered 2012-06-17: 50 mg via INTRAVENOUS
  Filled 2012-06-17: qty 1

## 2012-06-17 MED ORDER — DIPHENHYDRAMINE HCL 50 MG/ML IJ SOLN
25.0000 mg | Freq: Once | INTRAMUSCULAR | Status: AC
  Administered 2012-06-17: 50 mg via INTRAVENOUS

## 2012-06-17 MED ORDER — ONDANSETRON HCL 4 MG/2ML IJ SOLN: 4.0000 mg | Freq: Once | INTRAMUSCULAR | Status: DC

## 2012-06-17 NOTE — ED Notes (Signed)
Pt stats the cramping has started to subside. Requesting to be discharged.

## 2012-06-17 NOTE — ED Notes (Signed)
Pt c/o severe restless leg and leg cramping after the phenergan was infused. DR LInker aware and at bedside. Orders given for benadryl and administered at this time. Will reassess shortly. Pt given ice chips.

## 2012-06-17 NOTE — ED Notes (Signed)
Pt ambulatory to restroom

## 2012-07-28 ENCOUNTER — Encounter: Payer: BC Managed Care – PPO | Admitting: Family Medicine

## 2012-08-12 ENCOUNTER — Inpatient Hospital Stay (HOSPITAL_BASED_OUTPATIENT_CLINIC_OR_DEPARTMENT_OTHER): Admission: RE | Admit: 2012-08-12 | Payer: BC Managed Care – PPO | Source: Ambulatory Visit

## 2012-08-12 ENCOUNTER — Other Ambulatory Visit: Payer: Self-pay | Admitting: Family

## 2012-08-12 ENCOUNTER — Encounter: Payer: Self-pay | Admitting: Family

## 2012-08-12 ENCOUNTER — Ambulatory Visit (INDEPENDENT_AMBULATORY_CARE_PROVIDER_SITE_OTHER): Payer: BC Managed Care – PPO | Admitting: Family

## 2012-08-12 ENCOUNTER — Ambulatory Visit (HOSPITAL_BASED_OUTPATIENT_CLINIC_OR_DEPARTMENT_OTHER)
Admission: RE | Admit: 2012-08-12 | Discharge: 2012-08-12 | Disposition: A | Discharge status: Home / Self Care | Payer: BC Managed Care – PPO | Source: Ambulatory Visit | Attending: Family | Admitting: Family

## 2012-08-12 VITALS — BP 100/70 | HR 71 | Temp 98.6°F | Resp 16 | Ht 62.5 in | Wt 221.0 lb

## 2012-08-12 DIAGNOSIS — Z1231 Encounter for screening mammogram for malignant neoplasm of breast: Secondary | ICD-10-CM

## 2012-08-12 DIAGNOSIS — Z23 Encounter for immunization: Secondary | ICD-10-CM

## 2012-08-12 DIAGNOSIS — E559 Vitamin D deficiency, unspecified: Secondary | ICD-10-CM

## 2012-08-12 DIAGNOSIS — Z0000 Encounter for general adult medical examination without abnormal findings: Secondary | ICD-10-CM | POA: Insufficient documentation

## 2012-08-12 DIAGNOSIS — E538 Deficiency of other specified B group vitamins: Secondary | ICD-10-CM

## 2012-08-12 DIAGNOSIS — R922 Inconclusive mammogram: Secondary | ICD-10-CM | POA: Insufficient documentation

## 2012-08-12 LAB — HEPATIC FUNCTION PANEL
ALT: 11 U/L (ref 0–35)
AST: 11 U/L (ref 0–37)
Albumin: 4 g/dL (ref 3.5–5.2)
Alkaline Phosphatase: 57 U/L (ref 39–117)
Bilirubin, Direct: 0.1 mg/dL (ref 0.0–0.3)
Indirect Bilirubin: 0.4 mg/dL (ref 0.0–0.9)
Total Bilirubin: 0.5 mg/dL (ref 0.3–1.2)
Total Protein: 6.4 g/dL (ref 6.0–8.3)

## 2012-08-12 LAB — CBC WITH DIFFERENTIAL/PLATELET
Basophils Absolute: 0 10*3/uL (ref 0.0–0.1)
Basophils Relative: 0 % (ref 0–1)
Eosinophils Absolute: 0.1 10*3/uL (ref 0.0–0.7)
Eosinophils Relative: 2 % (ref 0–5)
HCT: 37.1 % (ref 36.0–46.0)
Hemoglobin: 12.1 g/dL (ref 12.0–15.0)
Lymphocytes Relative: 33 % (ref 12–46)
Lymphs Abs: 1.5 10*3/uL (ref 0.7–4.0)
MCH: 30.8 pg (ref 26.0–34.0)
MCHC: 32.6 g/dL (ref 30.0–36.0)
MCV: 94.4 fL (ref 78.0–100.0)
Monocytes Absolute: 0.4 10*3/uL (ref 0.1–1.0)
Monocytes Relative: 8 % (ref 3–12)
Neutro Abs: 2.6 10*3/uL (ref 1.7–7.7)
Neutrophils Relative %: 57 % (ref 43–77)
Platelets: 172 10*3/uL (ref 150–400)
RBC: 3.93 MIL/uL (ref 3.87–5.11)
RDW: 12.7 % (ref 11.5–15.5)
WBC: 4.6 10*3/uL (ref 4.0–10.5)

## 2012-08-12 LAB — LIPID PANEL
Cholesterol: 194 mg/dL (ref 0–200)
HDL: 43 mg/dL (ref >39)
LDL Cholesterol: 135 mg/dL — ABNORMAL HIGH (ref 0–99)
Total CHOL/HDL Ratio: 4.5 Ratio
Triglycerides: 79 mg/dL (ref <150)
VLDL: 16 mg/dL (ref 0–40)

## 2012-08-12 LAB — BASIC METABOLIC PANEL WITH GFR
BUN: 12 mg/dL (ref 6–23)
CO2: 28 mEq/L (ref 19–32)
Calcium: 8.9 mg/dL (ref 8.4–10.5)
Chloride: 108 mEq/L (ref 96–112)
Creat: 0.49 mg/dL — ABNORMAL LOW (ref 0.50–1.10)
GFR, Est African American: >89 mL/min
GFR, Est Non African American: >89 mL/min
Glucose, Bld: 82 mg/dL (ref 70–99)
Potassium: 4.3 mEq/L (ref 3.5–5.3)
Sodium: 141 mEq/L (ref 135–145)

## 2012-08-12 NOTE — Patient Instructions (Addendum)
Please schedule bone density at the front desk. Schedule mammogram on the first floor. Complete blood work prior to leaving.  Follow up in 1 year, sooner if problems/concerns.

## 2012-08-12 NOTE — Progress Notes (Signed)
Subjective:    Patient ID: Mikayla [name], female    DOB: 1966-02-04, 46 y.o.   MRN: [id number]  HPI  Pt here for fasting physical. Pt up to date with tetanus and colonoscopy. Thinks she may have had a DEXA in the past- >2 yrs ago.   Needs mammogram. Last pap smear 2011-normal.  Want pap smear today. Last ekg 06/2011. Walks some, trying to walk more.     Review of Systems  Constitutional: Negative for unexpected weight change.  HENT: Negative for congestion.   Eyes: Negative for visual disturbance.  Respiratory: Negative for cough and shortness of breath.   Cardiovascular: Negative for chest pain.  Gastrointestinal: Negative for nausea, vomiting and diarrhea.  Genitourinary: Negative for dysuria and frequency.  Musculoskeletal: Negative for myalgias and arthralgias.  Skin: Negative for rash.  Neurological: Negative for headaches.  Hematological: Negative for adenopathy.  Psychiatric/Behavioral:       Denies depression/anxiety   Past Medical History  Diagnosis Date  . Adjustment disorder with anxiety   . Obesity     History   Social History  . Marital Status: Married    Spouse Name: N/A    Number of Children: N/A  . Years of Education: N/A   Occupational History  . Not on file.   Social History Main Topics  . Smoking status: Never Smoker   . Smokeless tobacco: Not on file  . Alcohol Use: No  . Drug Use: No  . Sexually Active: Yes -- Female partner(s)   Other Topics Concern  . Not on file   Social History Narrative  . No narrative on file    Past Surgical History  Procedure Date  . Breast cyst excision     left  . Bladder suspension 04/2009  . Partial hysterectomy     no oophorectomy  . Tonsillectomy   . Bladder strap 2010  . Vaginal wound closure / repair 2010    anterior    Family History  Problem Relation Age of Onset  . Heart attack Mother   . Coronary artery disease Mother     triple bypass age 75  . Diabetes Maternal Grandmother   . Lung cancer  Mother   . Arthritis      both sides of family  . Depression      both sides of family  . Alcohol abuse      both sides of family  . Coronary artery disease Father     No Known Allergies  No current outpatient prescriptions on file prior to visit.    BP 100/70  Pulse 71  Temp 98.6 F (37 C) (Oral)  Resp 16  Ht 5' 2.5" (1.588 m)  Wt 221 lb (100.245 kg)  BMI 39.78 kg/m2  SpO2 98%       Objective:   Physical Exam Physical Exam  Constitutional: She is oriented to person, place, and time. She appears well-developed and well-nourished. No distress.  HENT:  Head: Normocephalic and atraumatic.  Right Ear: Tympanic membrane and ear canal normal.  Left Ear: Tympanic membrane and ear canal normal.  Mouth/Throat: Oropharynx is clear and moist.  Eyes: Pupils are equal, round, and reactive to light. No scleral icterus.  Neck: Normal range of motion. No thyromegaly present.  Cardiovascular: Normal rate and regular rhythm.   No murmur heard. Pulmonary/Chest: Effort normal and breath sounds normal. No respiratory distress. He has no wheezes. She has no rales. She exhibits no tenderness.  Abdominal: Soft. Bowel sounds are  normal. He exhibits no distension and no mass. There is no tenderness. There is no rebound and no guarding.  Musculoskeletal: She exhibits no edema.  Lymphadenopathy:    She has no cervical adenopathy.  Neurological: She is alert and oriented to person, place, and time. She has normal reflexes. She exhibits normal muscle tone. Coordination normal.  Skin: Skin is warm and dry.  Psychiatric: She has a normal mood and affect. Her behavior is normal. Judgment and thought content normal.  Breasts: Examined lying Right: Without masses, retractions, discharge or axillary adenopathy.  Left: Without masses, retractions, discharge or axillary adenopathy.  Inguinal/mons: Normal without inguinal adenopathy  External genitalia: Normal  BUS/Urethra/Skene's glands: Normal    Bladder: Normal  Vagina: Normal  Cervix: surgically absent Uterus: surgically absent Adnexa/parametria:  Rt: Without masses or tenderness.  Lt: Without masses or tenderness.  Anus and perineum: Normal           Assessment & Plan:          Assessment & Plan:

## 2012-08-12 NOTE — Assessment & Plan Note (Signed)
Obtain fasting labs. Flu shot today. Order dexa and mammo.  Encouraged pt to continue her work with diet, exercise, and weight loss.

## 2012-08-13 ENCOUNTER — Telehealth: Payer: Self-pay | Admitting: Family

## 2012-08-13 DIAGNOSIS — E559 Vitamin D deficiency, unspecified: Secondary | ICD-10-CM | POA: Insufficient documentation

## 2012-08-13 LAB — VITAMIN D 25 HYDROXY (VIT D DEFICIENCY, FRACTURES): Vit D, 25-Hydroxy: 27 ng/mL — ABNORMAL LOW (ref 30–89)

## 2012-08-13 LAB — URINALYSIS, ROUTINE W REFLEX MICROSCOPIC
Bilirubin Urine: NEGATIVE
Glucose, UA: NEGATIVE mg/dL
Hgb urine dipstick: NEGATIVE
Ketones, ur: NEGATIVE mg/dL
Leukocytes, UA: NEGATIVE
Nitrite: NEGATIVE
Protein, ur: NEGATIVE mg/dL
Specific Gravity, Urine: 1.021 (ref 1.005–1.030)
Urobilinogen, UA: 0.2 mg/dL (ref 0.0–1.0)
pH: 6 (ref 5.0–8.0)

## 2012-08-13 LAB — VITAMIN B12: Vitamin B-12: 262 pg/mL (ref 211–911)

## 2012-08-13 LAB — TSH: TSH: 2.397 u[IU]/mL (ref 0.350–4.500)

## 2012-08-13 MED ORDER — ERGOCALCIFEROL 1.25 MG (50000 UT) PO CAPS: 50000.0000 [IU] | ORAL_CAPSULE | ORAL | Status: DC

## 2012-08-13 NOTE — Telephone Encounter (Signed)
Spoke with pt re: abnormal mammogram and need to complete additional testing.  I have asked her to let me know if the breast center does not call her in 1 week to schedule follow up studies and she verbalizes understanding.  Reviewed lab work including vitamin D (low) recommended that she start once weekly vit D supplement which I will send to pharmacy. She is instructed to follow up in 3 months for vitamin D level (diagnosis is vit d deficiency).   Nicki Guadalajara- could you pls send order to lab? thanks

## 2012-08-14 ENCOUNTER — Ambulatory Visit (INDEPENDENT_AMBULATORY_CARE_PROVIDER_SITE_OTHER)
Admission: RE | Admit: 2012-08-14 | Discharge: 2012-08-14 | Disposition: A | Discharge status: Home / Self Care | Payer: BC Managed Care – PPO | Source: Ambulatory Visit

## 2012-08-14 DIAGNOSIS — Z0000 Encounter for general adult medical examination without abnormal findings: Secondary | ICD-10-CM

## 2012-08-14 NOTE — Telephone Encounter (Signed)
Future order placed and copy given to the lab.

## 2012-08-15 ENCOUNTER — Other Ambulatory Visit: Payer: Self-pay | Admitting: Family

## 2012-08-15 DIAGNOSIS — R928 Other abnormal and inconclusive findings on diagnostic imaging of breast: Secondary | ICD-10-CM

## 2012-08-19 ENCOUNTER — Other Ambulatory Visit: Payer: BC Managed Care – PPO

## 2012-08-22 ENCOUNTER — Telehealth: Payer: Self-pay | Admitting: Family

## 2012-08-22 NOTE — Telephone Encounter (Signed)
Spoke with pt, she remembers conversation of 08/13/12 but wanted a copy.  Advised her I will mail it to her and pt is agreeable.

## 2012-08-22 NOTE — Telephone Encounter (Signed)
Patient is requesting lab results. She would also like results to show in mychart.

## 2012-08-22 NOTE — Telephone Encounter (Signed)
Vitamin D was low, she should start weekly  vitamin D supplement as we discussed (see 9/25 phone note.) Thyroid, blood count, b12, liver, urine, sugar are all normal.  I have tried to release to mychart but have been unsuccessful.  Could you pls mail copy to pt. Thanks.

## 2012-08-25 ENCOUNTER — Ambulatory Visit
Admission: RE | Admit: 2012-08-25 | Discharge: 2012-08-25 | Disposition: A | Discharge status: Home / Self Care | Payer: BC Managed Care – PPO | Source: Ambulatory Visit | Attending: Family | Admitting: Family

## 2012-08-25 DIAGNOSIS — R928 Other abnormal and inconclusive findings on diagnostic imaging of breast: Secondary | ICD-10-CM

## 2012-09-05 ENCOUNTER — Ambulatory Visit (INDEPENDENT_AMBULATORY_CARE_PROVIDER_SITE_OTHER): Payer: BC Managed Care – PPO | Admitting: Family Medicine

## 2012-09-05 ENCOUNTER — Encounter: Payer: Self-pay | Admitting: Family Medicine

## 2012-09-05 VITALS — BP 110/80 | HR 76 | Temp 98.1°F | Ht 62.25 in | Wt 223.2 lb

## 2012-09-05 DIAGNOSIS — L301 Dyshidrosis [pompholyx]: Secondary | ICD-10-CM

## 2012-09-05 MED ORDER — TRIAMCINOLONE ACETONIDE 0.1 % EX OINT: TOPICAL_OINTMENT | Freq: Two times a day (BID) | CUTANEOUS | Status: DC

## 2012-09-05 NOTE — Progress Notes (Signed)
  Subjective:    Patient ID: Mikayla [name], female    DOB: 12-23-1965, 46 y.o.   MRN: [id number]  HPI Hand rash- sxs started 3 weeks ago, on palms.  Very itchy.  + burning.  Pain worsens when wet.  Has been wearing gloves to wash dishes.  Using OTC hydrocortisone cream w/out relief.  Areas will intermittently improve and then flare.  No other similar areas.  Areas are blistered and dry.   Review of Systems For ROS see HPI     Objective:   Physical Exam  Vitals reviewed. Constitutional: She appears well-developed and well-nourished. No distress.  Skin: Skin is warm and dry. Rash (bilateral palms, small blisters, vesicles consistent w/ dyshidrotic eczema) noted. There is erythema.          Assessment & Plan:

## 2012-09-05 NOTE — Patient Instructions (Addendum)
This is called dyshidrotic eczema and only impacts hands and feet Use the high potency steroid ointment twice daily until skin heals Try and avoid soaking hands or overdrying Call with any questions or concerns Hang in there!!!

## 2012-09-07 NOTE — Assessment & Plan Note (Signed)
New.  Start topical steroid ointment.  Reviewed supportive care and red flags that should prompt return.  Pt expressed understanding and is in agreement w/ plan.  

## 2012-09-24 ENCOUNTER — Ambulatory Visit (INDEPENDENT_AMBULATORY_CARE_PROVIDER_SITE_OTHER): Payer: BC Managed Care – PPO | Admitting: Family

## 2012-09-24 ENCOUNTER — Encounter: Payer: Self-pay | Admitting: Family

## 2012-09-24 ENCOUNTER — Ambulatory Visit: Payer: BC Managed Care – PPO | Admitting: Internal Medicine

## 2012-09-24 VITALS — BP 114/82 | HR 69 | Temp 98.2°F | Resp 16 | Ht 62.5 in

## 2012-09-24 DIAGNOSIS — J40 Bronchitis, not specified as acute or chronic: Secondary | ICD-10-CM | POA: Insufficient documentation

## 2012-09-24 MED ORDER — AZITHROMYCIN 250 MG PO TABS: ORAL_TABLET | ORAL | Status: DC

## 2012-09-24 NOTE — Progress Notes (Signed)
  Subjective:    Patient ID: Mikayla [name], female    DOB: 19-May-1966, 46 y.o.   MRN: [id number]  HPI Pt presents today with chief complaint of cough.  She reports ongoing nasal drainage.  Symptoms have been present x 8 days.  Has tried nyquil and codeine cough syrup.  She denies associated fever, but feels sweaty when she gets up and moves around.    Review of Systems See HPI  Past Medical History  Diagnosis Date  . Adjustment disorder with anxiety   . Obesity     History   Social History  . Marital Status: Married    Spouse Name: N/A    Number of Children: N/A  . Years of Education: N/A   Occupational History  . Not on file.   Social History Main Topics  . Smoking status: Never Smoker   . Smokeless tobacco: Not on file  . Alcohol Use: No  . Drug Use: No  . Sexually Active: Yes -- Female partner(s)   Other Topics Concern  . Not on file   Social History Narrative  . No narrative on file    Past Surgical History  Procedure Date  . Breast cyst excision     left  . Bladder suspension 04/2009  . Partial hysterectomy     no oophorectomy  . Tonsillectomy   . Bladder strap 2010  . Vaginal wound closure / repair 2010    anterior    Family History  Problem Relation Age of Onset  . Heart attack Mother   . Coronary artery disease Mother     triple bypass age 55  . Diabetes Maternal Grandmother   . Lung cancer Mother   . Arthritis      both sides of family  . Depression      both sides of family  . Alcohol abuse      both sides of family  . Coronary artery disease Father     No Known Allergies  Current Outpatient Prescriptions on File Prior to Visit  Medication Sig Dispense Refill  . ergocalciferol (VITAMIN D2) 50000 UNITS capsule Take 1 capsule (50,000 Units total) by mouth once a week.  12 capsule  0  . triamcinolone ointment (KENALOG) 0.1 % Apply topically 2 (two) times daily.  90 g  1    BP 114/82  Pulse 69  Temp 98.2 F (36.8 C) (Oral)  Resp 16   Ht 5' 2.5" (1.588 m)  SpO2 97%       Objective:   Physical Exam  Constitutional: She appears well-developed and well-nourished. No distress.  HENT:  Head: Normocephalic and atraumatic.  Right Ear: Tympanic membrane and ear canal normal.  Left Ear: Tympanic membrane and ear canal normal.  Mouth/Throat: No oropharyngeal exudate or posterior oropharyngeal edema.  Cardiovascular: Normal rate and regular rhythm.   No murmur heard. Pulmonary/Chest: Effort normal and breath sounds normal. No respiratory distress. She has no wheezes. She has no rales. She exhibits no tenderness.          Assessment & Plan:

## 2012-09-24 NOTE — Assessment & Plan Note (Signed)
Will rx with zithromax.   

## 2012-09-24 NOTE — Patient Instructions (Addendum)
Please call if your symptoms worsen or if no improvement in 2-3 days. 

## 2012-10-03 ENCOUNTER — Encounter: Payer: BC Managed Care – PPO | Admitting: Family Medicine

## 2012-11-17 ENCOUNTER — Other Ambulatory Visit: Payer: Self-pay | Admitting: Internal Medicine

## 2012-11-18 NOTE — Telephone Encounter (Signed)
Last seen 05/02/11 and filled by Melissa on 12/25/11 # 30. Please advise     KP

## 2012-11-20 ENCOUNTER — Other Ambulatory Visit: Payer: Self-pay | Admitting: Internal Medicine

## 2012-11-20 NOTE — Telephone Encounter (Signed)
Msg left advising the patient that the Rx is ready for pick up at the office due to having to sign a contract. KP

## 2012-11-20 NOTE — Telephone Encounter (Signed)
Msg left advising the patient that the Rx is ready for pick up.       KP

## 2013-02-03 ENCOUNTER — Other Ambulatory Visit: Payer: Self-pay | Admitting: Urology

## 2013-02-05 ENCOUNTER — Encounter: Payer: Self-pay | Admitting: Internal Medicine

## 2013-02-05 ENCOUNTER — Ambulatory Visit (INDEPENDENT_AMBULATORY_CARE_PROVIDER_SITE_OTHER): Payer: BC Managed Care – PPO | Admitting: Internal Medicine

## 2013-02-05 VITALS — BP 122/78 | HR 75 | Temp 98.1°F | Wt 234.0 lb

## 2013-02-05 DIAGNOSIS — R110 Nausea: Secondary | ICD-10-CM

## 2013-02-05 DIAGNOSIS — R300 Dysuria: Secondary | ICD-10-CM

## 2013-02-05 DIAGNOSIS — R195 Other fecal abnormalities: Secondary | ICD-10-CM

## 2013-02-05 LAB — POCT URINALYSIS DIPSTICK
Bilirubin, UA: NEGATIVE
Blood, UA: NEGATIVE
Glucose, UA: NEGATIVE
Ketones, UA: NEGATIVE
Leukocytes, UA: NEGATIVE
Nitrite, UA: NEGATIVE
Protein, UA: NEGATIVE
Spec Grav, UA: >=1.03
Urobilinogen, UA: 0.2
pH, UA: 5

## 2013-02-05 MED ORDER — ONDANSETRON HCL 4 MG PO TABS: 4.0000 mg | ORAL_TABLET | Freq: Three times a day (TID) | ORAL | Status: DC | PRN

## 2013-02-05 NOTE — Patient Instructions (Addendum)
Please take the probiotic , Align, every day until the bowels are normal. This will replace the normal bacteria which  are necessary for formation of normal stool and processing of food. 

## 2013-02-05 NOTE — Progress Notes (Signed)
  Subjective:    Patient ID: Mikayla [name], female    DOB: 1966/09/29, 47 y.o.   MRN: [id number]  HPI  3 weeks ago she and other family members had apparent infectious gastroenteritis associated with nausea and vomiting and frank diarrhea. This responded to over-the-counter medications &  Imodium AD. She continues to have some residual nausea and loose stool.  She is being evaluated for a bladder growth by her urologist. She had cystoscopy last week. Urine culture collected earlier this week did not reveal significant urinary tract infection. A biopsy is scheduled next week of the lesion    Review of Systems She was treated for a urinary tract infection by urologist in January. Review of her chart here demonstrates urine cultures November/2012 and January /2013 which did not show definitive urinary tract infection.  She does describe mild dysuria as well as malodorous urine.     Objective:   Physical Exam General appearance:well  nourished w/o distress.  Eyes: No conjunctival inflammation or scleral icterus is present.  Oral exam: Dental hygiene is good; lips and gums are healthy appearing.There is no oropharyngeal erythema or exudate noted.   Heart:  Normal rate and regular rhythm. S1 and S2 normal without gallop, murmur, click, rub or other extra sounds.S4 with slurring at  LSB .   Lungs:Chest clear to auscultation; no wheezes, rhonchi,rales ,or rubs present.No increased work of breathing.   Abdomen: bowel sounds normal, soft and non-tender without masses, organomegaly or hernias noted.  No guarding or rebound   Skin:Warm & dry.  Intact without suspicious lesions or rashes ; no jaundice or tenting  Lymphatic: No lymphadenopathy is noted about the head, neck, axilla           Assessment & Plan:  #1 mild dysuria; urinalysis is normal blood culture will be collected  #2 loose stool; status post infectious gastroenteritis  Plan: See orders  & recommendations

## 2013-02-08 LAB — URINE CULTURE: Colony Count: 45000

## 2013-02-09 ENCOUNTER — Encounter (HOSPITAL_BASED_OUTPATIENT_CLINIC_OR_DEPARTMENT_OTHER): Payer: Self-pay | Admitting: *Deleted

## 2013-02-09 NOTE — Progress Notes (Signed)
NPO AFTER MN. ARRIVES AT 0845. NEEDS HG.
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
## 2013-02-12 ENCOUNTER — Encounter (HOSPITAL_BASED_OUTPATIENT_CLINIC_OR_DEPARTMENT_OTHER): Payer: Self-pay | Admitting: *Deleted

## 2013-02-12 ENCOUNTER — Encounter (HOSPITAL_BASED_OUTPATIENT_CLINIC_OR_DEPARTMENT_OTHER): Payer: Self-pay | Admitting: Anesthesiology

## 2013-02-12 ENCOUNTER — Ambulatory Visit (HOSPITAL_BASED_OUTPATIENT_CLINIC_OR_DEPARTMENT_OTHER)
Admission: RE | Admit: 2013-02-12 | Discharge: 2013-02-12 | Disposition: A | Discharge status: Home / Self Care | Payer: BC Managed Care – PPO | Source: Ambulatory Visit | Attending: Urology | Admitting: Urology

## 2013-02-12 ENCOUNTER — Encounter (HOSPITAL_BASED_OUTPATIENT_CLINIC_OR_DEPARTMENT_OTHER)
Admission: RE | Disposition: A | Discharge status: Home / Self Care | Payer: Self-pay | Source: Ambulatory Visit | Attending: Urology

## 2013-02-12 ENCOUNTER — Ambulatory Visit (HOSPITAL_BASED_OUTPATIENT_CLINIC_OR_DEPARTMENT_OTHER): Payer: BC Managed Care – PPO | Admitting: Anesthesiology

## 2013-02-12 DIAGNOSIS — F3289 Other specified depressive episodes: Secondary | ICD-10-CM | POA: Insufficient documentation

## 2013-02-12 DIAGNOSIS — N329 Bladder disorder, unspecified: Secondary | ICD-10-CM | POA: Insufficient documentation

## 2013-02-12 DIAGNOSIS — N3946 Mixed incontinence: Secondary | ICD-10-CM | POA: Insufficient documentation

## 2013-02-12 DIAGNOSIS — F411 Generalized anxiety disorder: Secondary | ICD-10-CM | POA: Insufficient documentation

## 2013-02-12 DIAGNOSIS — F329 Major depressive disorder, single episode, unspecified: Secondary | ICD-10-CM | POA: Insufficient documentation

## 2013-02-12 DIAGNOSIS — N3020 Other chronic cystitis without hematuria: Secondary | ICD-10-CM | POA: Insufficient documentation

## 2013-02-12 DIAGNOSIS — Z6841 Body Mass Index (BMI) 40.0 and over, adult: Secondary | ICD-10-CM | POA: Insufficient documentation

## 2013-02-12 DIAGNOSIS — E669 Obesity, unspecified: Secondary | ICD-10-CM | POA: Insufficient documentation

## 2013-02-12 DIAGNOSIS — N949 Unspecified condition associated with female genital organs and menstrual cycle: Secondary | ICD-10-CM | POA: Insufficient documentation

## 2013-02-12 DIAGNOSIS — Z79899 Other long term (current) drug therapy: Secondary | ICD-10-CM | POA: Insufficient documentation

## 2013-02-12 HISTORY — PX: CYSTOSCOPY WITH BIOPSY: SHX5122

## 2013-02-12 LAB — POCT HEMOGLOBIN-HEMACUE: Hemoglobin: 13.3 g/dL (ref 12.0–15.0)

## 2013-02-12 SURGERY — CYSTOSCOPY, WITH BIOPSY
Anesthesia: General | Site: Bladder | Wound class: Clean Contaminated

## 2013-02-12 MED ORDER — OXYCODONE HCL 5 MG PO TABS
5.0000 mg | ORAL_TABLET | ORAL | Status: DC | PRN
  Filled 2013-02-12: qty 2

## 2013-02-12 MED ORDER — BELLADONNA ALKALOIDS-OPIUM 16.2-60 MG RE SUPP
RECTAL | Status: DC | PRN
  Administered 2013-02-12: 1 via RECTAL

## 2013-02-12 MED ORDER — ONDANSETRON HCL 4 MG/2ML IJ SOLN
INTRAMUSCULAR | Status: DC | PRN
  Administered 2013-02-12: 4 mg via INTRAVENOUS

## 2013-02-12 MED ORDER — ONDANSETRON HCL 4 MG/2ML IJ SOLN
4.0000 mg | Freq: Four times a day (QID) | INTRAMUSCULAR | Status: DC | PRN
  Filled 2013-02-12: qty 2

## 2013-02-12 MED ORDER — FENTANYL CITRATE 0.05 MG/ML IJ SOLN
25.0000 ug | INTRAMUSCULAR | Status: DC | PRN
  Filled 2013-02-12: qty 1

## 2013-02-12 MED ORDER — PROPOFOL 10 MG/ML IV BOLUS
INTRAVENOUS | Status: DC | PRN
  Administered 2013-02-12: 200 mg via INTRAVENOUS

## 2013-02-12 MED ORDER — FENTANYL CITRATE 0.05 MG/ML IJ SOLN
INTRAMUSCULAR | Status: DC | PRN
  Administered 2013-02-12: 50 ug via INTRAVENOUS
  Administered 2013-02-12 (×2): 25 ug via INTRAVENOUS

## 2013-02-12 MED ORDER — STERILE WATER FOR IRRIGATION IR SOLN
Status: DC | PRN
  Administered 2013-02-12: 3000 mL

## 2013-02-12 MED ORDER — CIPROFLOXACIN IN D5W 400 MG/200ML IV SOLN
400.0000 mg | INTRAVENOUS | Status: AC
  Administered 2013-02-12: 400 mg via INTRAVENOUS
  Filled 2013-02-12: qty 200

## 2013-02-12 MED ORDER — PHENAZOPYRIDINE HCL 200 MG PO TABS
200.0000 mg | ORAL_TABLET | Freq: Three times a day (TID) | ORAL | Status: DC
  Administered 2013-02-12: 200 mg via ORAL
  Filled 2013-02-12: qty 1

## 2013-02-12 MED ORDER — PHENAZOPYRIDINE HCL 200 MG PO TABS: 200.0000 mg | ORAL_TABLET | Freq: Three times a day (TID) | ORAL | Status: DC | PRN

## 2013-02-12 MED ORDER — HYDROCODONE-ACETAMINOPHEN 5-325 MG PO TABS: 1.0000 | ORAL_TABLET | Freq: Four times a day (QID) | ORAL | Status: DC | PRN

## 2013-02-12 MED ORDER — LACTATED RINGERS IV SOLN
INTRAVENOUS | Status: DC
  Filled 2013-02-12: qty 1000

## 2013-02-12 MED ORDER — SODIUM CHLORIDE 0.9 % IV SOLN
250.0000 mL | INTRAVENOUS | Status: DC | PRN
  Filled 2013-02-12: qty 250

## 2013-02-12 MED ORDER — LACTATED RINGERS IV SOLN
INTRAVENOUS | Status: DC
  Administered 2013-02-12: 100 mL/h via INTRAVENOUS
  Filled 2013-02-12: qty 1000

## 2013-02-12 MED ORDER — ACETAMINOPHEN 650 MG RE SUPP
650.0000 mg | RECTAL | Status: DC | PRN
  Filled 2013-02-12: qty 1

## 2013-02-12 MED ORDER — FENTANYL CITRATE 0.05 MG/ML IJ SOLN
25.0000 ug | INTRAMUSCULAR | Status: DC | PRN
  Administered 2013-02-12: 25 ug via INTRAVENOUS
  Filled 2013-02-12: qty 1

## 2013-02-12 MED ORDER — HYDROCODONE-ACETAMINOPHEN 5-325 MG PO TABS
1.0000 | ORAL_TABLET | Freq: Four times a day (QID) | ORAL | Status: DC | PRN
  Administered 2013-02-12: 1 via ORAL
  Filled 2013-02-12: qty 1

## 2013-02-12 MED ORDER — LIDOCAINE HCL (CARDIAC) 20 MG/ML IV SOLN
INTRAVENOUS | Status: DC | PRN
  Administered 2013-02-12: 50 mg via INTRAVENOUS

## 2013-02-12 MED ORDER — SODIUM CHLORIDE 0.9 % IJ SOLN
3.0000 mL | Freq: Two times a day (BID) | INTRAMUSCULAR | Status: DC
  Filled 2013-02-12: qty 3

## 2013-02-12 MED ORDER — DEXAMETHASONE SODIUM PHOSPHATE 4 MG/ML IJ SOLN
INTRAMUSCULAR | Status: DC | PRN
  Administered 2013-02-12: 8 mg via INTRAVENOUS

## 2013-02-12 MED ORDER — ACETAMINOPHEN 325 MG PO TABS
650.0000 mg | ORAL_TABLET | ORAL | Status: DC | PRN
  Filled 2013-02-12: qty 2

## 2013-02-12 MED ORDER — MIDAZOLAM HCL 5 MG/5ML IJ SOLN
INTRAMUSCULAR | Status: DC | PRN
  Administered 2013-02-12: 2 mg via INTRAVENOUS

## 2013-02-12 MED ORDER — SODIUM CHLORIDE 0.9 % IJ SOLN
3.0000 mL | INTRAMUSCULAR | Status: DC | PRN
  Filled 2013-02-12: qty 3

## 2013-02-12 SURGICAL SUPPLY — 25 items
BAG DRAIN URO-CYSTO SKYTR STRL (DRAIN) ×2 IMPLANT
BAG DRN ANRFLXCHMBR STRAP LEK (BAG) ×1
BAG DRN UROCATH (DRAIN) ×1
BAG URINE LEG 19OZ MD ST LTX (BAG) ×1 IMPLANT
CANISTER SUCT LVC 12 LTR MEDI- (MISCELLANEOUS) IMPLANT
CATH FOLEY 2WAY SLVR  5CC 16FR (CATHETERS)
CATH FOLEY 2WAY SLVR  5CC 18FR (CATHETERS) ×1
CATH FOLEY 2WAY SLVR 5CC 16FR (CATHETERS) IMPLANT
CATH FOLEY 2WAY SLVR 5CC 18FR (CATHETERS) IMPLANT
CLOTH BEACON ORANGE TIMEOUT ST (SAFETY) ×2 IMPLANT
DRAPE CAMERA CLOSED 9X96 (DRAPES) ×2 IMPLANT
ELECT REM PT RETURN 9FT ADLT (ELECTROSURGICAL) ×2
ELECTRODE REM PT RTRN 9FT ADLT (ELECTROSURGICAL) ×1 IMPLANT
GLOVE SKINSENSE NS SZ7.0 (GLOVE) ×2
GLOVE SKINSENSE STRL SZ7.0 (GLOVE) IMPLANT
GLOVE SURG SS PI 8.0 STRL IVOR (GLOVE) ×2 IMPLANT
GOWN PREVENTION PLUS LG XLONG (DISPOSABLE) ×2 IMPLANT
GOWN STRL REIN XL XLG (GOWN DISPOSABLE) ×2 IMPLANT
NDL SAFETY ECLIPSE 18X1.5 (NEEDLE) IMPLANT
NEEDLE HYPO 18GX1.5 SHARP (NEEDLE)
NEEDLE HYPO 22GX1.5 SAFETY (NEEDLE) IMPLANT
NS IRRIG 500ML POUR BTL (IV SOLUTION) IMPLANT
PACK CYSTOSCOPY (CUSTOM PROCEDURE TRAY) ×2 IMPLANT
SYR 20CC LL (SYRINGE) IMPLANT
WATER STERILE IRR 3000ML UROMA (IV SOLUTION) ×2 IMPLANT

## 2013-02-12 NOTE — Anesthesia Procedure Notes (Signed)
Procedure Name: LMA Insertion Date/Time: 02/12/2013 9:42 AM Performed by: Renella Cunas D Pre-anesthesia Checklist: Patient identified, Emergency Drugs available, Suction available and Patient being monitored Patient Re-evaluated:Patient Re-evaluated prior to inductionOxygen Delivery Method: Circle System Utilized Preoxygenation: Pre-oxygenation with 100% oxygen Intubation Type: IV induction Ventilation: Mask ventilation without difficulty LMA: LMA inserted LMA Size: 4.0 Number of attempts: 1 Airway Equipment and Method: bite block Placement Confirmation: positive ETCO2 Tube secured with: Tape Dental Injury: Teeth and Oropharynx as per pre-operative assessment

## 2013-02-12 NOTE — Transfer of Care (Signed)
Immediate Anesthesia Transfer of Care Note  Patient: Mikayla [name]  Procedure(s) Performed: Procedure(s) (LRB): CYSTOSCOPY BLADDER BIOPSY WITH FULGURATION (N/A)  Patient Location: PACU  Anesthesia Type: General  Level of Consciousness: awake, oriented, sedated and patient cooperative  Airway & Oxygen Therapy: Patient Spontanous Breathing and Patient connected to face mask oxygen  Post-op Assessment: Report given to PACU RN and Post -op Vital signs reviewed and stable  Post vital signs: Reviewed and stable  Complications: No apparent anesthesia complications

## 2013-02-12 NOTE — Anesthesia Preprocedure Evaluation (Addendum)
Anesthesia Evaluation  Patient identified by MRN, date of birth, ID band Patient awake    Reviewed: Allergy & Precautions, H&P , NPO status , Patient's Chart, lab work & pertinent test results  Airway Mallampati: II TM Distance: >3 FB Neck ROM: full    Dental  (+) Caps and Dental Advisory Given 4 upper front teeth are capped and one lower front tooth:   Pulmonary neg pulmonary ROS,  breath sounds clear to auscultation  Pulmonary exam normal       Cardiovascular Exercise Tolerance: Good negative cardio ROS  Rhythm:regular Rate:Normal     Neuro/Psych Anxiety Depression negative neurological ROS  negative psych ROS   GI/Hepatic negative GI ROS, Neg liver ROS,   Endo/Other  Morbid obesity  Renal/GU negative Renal ROS  negative genitourinary   Musculoskeletal   Abdominal (+) + obese,   Peds  Hematology negative hematology ROS (+)   Anesthesia Other Findings   Reproductive/Obstetrics negative OB ROS                         Anesthesia Physical Anesthesia Plan  ASA: II  Anesthesia Plan: General   Post-op Pain Management:    Induction: Intravenous  Airway Management Planned: LMA  Additional Equipment:   Intra-op Plan:   Post-operative Plan:   Informed Consent: I have reviewed the patients History and Physical, chart, labs and discussed the procedure including the risks, benefits and alternatives for the proposed anesthesia with the patient or authorized representative who has indicated his/her understanding and acceptance.   Dental Advisory Given  Plan Discussed with: CRNA and Surgeon  Anesthesia Plan Comments:       Anesthesia Quick Evaluation

## 2013-02-12 NOTE — Brief Op Note (Signed)
02/12/2013  9:59 AM  PATIENT:  Florian Buff  47 y.o. female  PRE-OPERATIVE DIAGNOSIS:  BLADDER LESION  POST-OPERATIVE DIAGNOSIS:  Bladder lesion  PROCEDURE:  Procedure(s): CYSTOSCOPY BLADDER BIOPSY WITH FULGURATION (N/A)  SURGEON:  Surgeon(s) and Role:    * Anner Crete, MD - Primary  PHYSICIAN ASSISTANT:   ASSISTANTS: none   ANESTHESIA:   general  EBL:  Total I/O In: 200 [I.V.:200] Out: -   BLOOD ADMINISTERED:none  DRAINS: Urinary Catheter (Foley)   LOCAL MEDICATIONS USED:  NONE  SPECIMEN:  Source of Specimen:  bladder biopsy from dome  DISPOSITION OF SPECIMEN:  PATHOLOGY  COUNTS:  YES  TOURNIQUET:  * No tourniquets in log *  DICTATION: .Other Dictation: Dictation Number [id number]  PLAN OF CARE: Discharge to home after PACU  PATIENT DISPOSITION:  PACU - hemodynamically stable.   Delay start of Pharmacological VTE agent (>24hrs) due to surgical blood loss or risk of bleeding: not applicable

## 2013-02-12 NOTE — Interval H&P Note (Signed)
History and Physical Interval Note:  02/12/2013 9:15 AM  Mikayla [name]  has presented today for surgery, with the diagnosis of BLADDER LESION  The various methods of treatment have been discussed with the patient and family. After consideration of risks, benefits and other options for treatment, the patient has consented to  Procedure(s): CYSTOSCOPY BLADDER BIOPSY WITH FULGURATION (N/A) as a surgical intervention .  The patient's history has been reviewed, patient examined, no change in status, stable for surgery.  I have reviewed the patient's chart and labs.  Questions were answered to the patient's satisfaction.     WRENN,JOHN J

## 2013-02-12 NOTE — Anesthesia Postprocedure Evaluation (Signed)
  Anesthesia Post-op Note  Patient: Mikayla [name]  Procedure(s) Performed: Procedure(s) (LRB): CYSTOSCOPY BLADDER BIOPSY WITH FULGURATION (N/A)  Patient Location: PACU  Anesthesia Type: General  Level of Consciousness: awake and alert   Airway and Oxygen Therapy: Patient Spontanous Breathing  Post-op Pain: mild  Post-op Assessment: Post-op Vital signs reviewed, Patient's Cardiovascular Status Stable, Respiratory Function Stable, Patent Airway and No signs of Nausea or vomiting  Last Vitals:  Filed Vitals:   02/12/13 1030  BP: 104/75  Pulse: 62  Temp:   Resp: 13    Post-op Vital Signs: stable   Complications: No apparent anesthesia complications

## 2013-02-13 ENCOUNTER — Encounter (HOSPITAL_BASED_OUTPATIENT_CLINIC_OR_DEPARTMENT_OTHER): Payer: Self-pay | Admitting: Urology

## 2013-02-13 NOTE — Op Note (Signed)
[name], Mikayla [name]                 ACCOUNT NO.:  [id number]  MEDICAL RECORD NO.:  [id number]  LOCATION:                                FACILITY:  [facility]  PHYSICIAN:  Excell Seltzer. [name], M.D.    DATE OF BIRTH:  1966/01/02  DATE OF PROCEDURE:  02/12/2013 DATE OF DISCHARGE:                              OPERATIVE REPORT   PROCEDURE:  Cystoscopy with bladder biopsy and fulguration.  PREOPERATIVE DIAGNOSIS:  Bladder lesion.  POSTOPERATIVE DIAGNOSIS:  Bladder lesion.  SURGEON:  Excell Seltzer. [name], M.D.  ANESTHESIA:  General.  SPECIMEN:  Cup biopsy from down the ladder.  DRAIN:  18-French Foley catheter.  COMPLICATIONS:  None.  INDICATIONS:  [name] is a 47 year old, white female, who has a history of recurrent UTIs.  She has had some irritative voiding symptoms. Cystoscopy was performed recently and this revealed an erythematous area on the dome of the bladder, approximately 2-3 cm in size.  She has had similar area of biopsy few years ago that was negative, but this would actually appeared worse than before, so it was felt that repeat biopsy was indicated.  FINDINGS OF PROCEDURE:  She was given Cipro and taken to the operating room where general anesthetic was induced.  She was placed in lithotomy position.  Her perineum and genitalia were prepped with Betadine solution.  She was draped in usual sterile fashion.  Cystoscopy was performed using the 22-French scope and 12-degree lens.  Examination revealed normal urethra.  The bladder wall had mild trabeculation.  No tumors or stones were noted.  There was a very faint patch of slightly erythematous mucosa on the dome.  This was actually superior to the prior biopsy, which was on the posterior wall.  A scar was noted in that area.  The ureteral orifices were unremarkable.  She has efflux and clear urine.  Once a thorough inspection of the bladder wall had been performed, a cup biopsy forceps was placed and a single cup biopsy was  obtained from the erythematous area on the dome.  After the biopsy fat was identified, the biopsy site was generously fulgurated for hemostasis.  Other surrounding mucosa was not sufficiently abnormal to merit more extensive fulguration.  Once the cystoscope was removed, I placed an 18-French Foley catheter with 10 mL in the balloon to drain the bladder to rule out heel overnight because I could see fat in the biopsy.  At this point, the patient was taken down from lithotomy position.  Her anesthetic was reversed.  She was moved to the recovery room in stable condition.  There were no complications.     Excell Seltzer. [name], M.D.     JJW/MEDQ  D:  02/12/2013  T:  02/13/2013  Job:  [id number]

## 2013-06-09 ENCOUNTER — Encounter: Payer: Self-pay | Admitting: Family Medicine

## 2013-06-09 ENCOUNTER — Ambulatory Visit (INDEPENDENT_AMBULATORY_CARE_PROVIDER_SITE_OTHER): Payer: BC Managed Care – PPO | Admitting: Family Medicine

## 2013-06-09 VITALS — BP 122/74 | HR 89 | Temp 98.4°F | Wt 253.4 lb

## 2013-06-09 DIAGNOSIS — M549 Dorsalgia, unspecified: Secondary | ICD-10-CM

## 2013-06-09 DIAGNOSIS — Z0000 Encounter for general adult medical examination without abnormal findings: Secondary | ICD-10-CM

## 2013-06-09 MED ORDER — HYDROCODONE-ACETAMINOPHEN 5-325 MG PO TABS: 1.0000 | ORAL_TABLET | Freq: Four times a day (QID) | ORAL | Status: DC | PRN

## 2013-06-09 MED ORDER — CYCLOBENZAPRINE HCL 10 MG PO TABS: 10.0000 mg | ORAL_TABLET | Freq: Three times a day (TID) | ORAL | Status: DC | PRN

## 2013-06-09 NOTE — Patient Instructions (Signed)
Back Pain, Adult  Low back pain is very common. About 1 in 5 people have back pain. The cause of low back pain is rarely dangerous. The pain often gets better over time. About half of people with a sudden onset of back pain feel better in just 2 weeks. About 8 in 10 people feel better by 6 weeks.   CAUSES  Some common causes of back pain include:  · Strain of the muscles or ligaments supporting the spine.  · Wear and tear (degeneration) of the spinal discs.  · Arthritis.  · Direct injury to the back.  DIAGNOSIS  Most of the time, the direct cause of low back pain is not known. However, back pain can be treated effectively even when the exact cause of the pain is unknown. Answering your caregiver's questions about your overall health and symptoms is one of the most accurate ways to make sure the cause of your pain is not dangerous. If your caregiver needs more information, he or she may order lab work or imaging tests (X-rays or MRIs). However, even if imaging tests show changes in your back, this usually does not require surgery.  HOME CARE INSTRUCTIONS  For many people, back pain returns. Since low back pain is rarely dangerous, it is often a condition that people can learn to manage on their own.   · Remain active. It is stressful on the back to sit or stand in one place. Do not sit, drive, or stand in one place for more than 30 minutes at a time. Take short walks on level surfaces as soon as pain allows. Try to increase the length of time you walk each day.  · Do not stay in bed. Resting more than 1 or 2 days can delay your recovery.  · Do not avoid exercise or work. Your body is made to move. It is not dangerous to be active, even though your back may hurt. Your back will likely heal faster if you return to being active before your pain is gone.  · Pay attention to your body when you  bend and lift. Many people have less discomfort when lifting if they bend their knees, keep the load close to their bodies, and  avoid twisting. Often, the most comfortable positions are those that put less stress on your recovering back.  · Find a comfortable position to sleep. Use a firm mattress and lie on your side with your knees slightly bent. If you lie on your back, put a pillow under your knees.  · Only take over-the-counter or prescription medicines as directed by your caregiver. Over-the-counter medicines to reduce pain and inflammation are often the most helpful. Your caregiver may prescribe muscle relaxant drugs. These medicines help dull your pain so you can more quickly return to your normal activities and healthy exercise.  · Put ice on the injured area.  · Put ice in a plastic bag.  · Place a towel between your skin and the bag.  · Leave the ice on for 15-20 minutes, 3-4 times a day for the first 2 to 3 days. After that, ice and heat may be alternated to reduce pain and spasms.  · Ask your caregiver about trying back exercises and gentle massage. This may be of some benefit.  · Avoid feeling anxious or stressed. Stress increases muscle tension and can worsen back pain. It is important to recognize when you are anxious or stressed and learn ways to manage it. Exercise is a great option.  SEEK MEDICAL CARE IF:  · You have pain that is not relieved with rest or   medicine.  · You have pain that does not improve in 1 week.  · You have new symptoms.  · You are generally not feeling well.  SEEK IMMEDIATE MEDICAL CARE IF:   · You have pain that radiates from your back into your legs.  · You develop new bowel or bladder control problems.  · You have unusual weakness or numbness in your arms or legs.  · You develop nausea or vomiting.  · You develop abdominal pain.  · You feel faint.  Document Released: 11/05/2005 Document Revised: 05/06/2012 Document Reviewed: 03/26/2011  [employer]® Patient Information ©2014 [employer].

## 2013-06-09 NOTE — Progress Notes (Signed)
  Subjective:    KENDELLE SCHWEERS is a 47 y.o. female who presents for evaluation of low back pain. The patient has had recurrent self limited episodes of low back pain in the past. Symptoms have been present for a few days and are gradually worsening.  Onset was related to / precipitated by bending over. The pain is located in the left lumbar area and radiates to the right thigh. The pain is described as aching and sharp and occurs all day. She rates her pain as moderate. Symptoms are exacerbated by flexion, lifting, sitting and standing. Symptoms are improved by chiropractic manipulation. She has also tried nothing which provided no symptom relief. She has no other symptoms associated with the back pain. The patient has no "red flag" history indicative of complicated back pain.  The following portions of the patient's history were reviewed and updated as appropriate: allergies, current medications, past family history, past medical history, past social history, past surgical history and problem list.  Review of Systems Pertinent items are noted in HPI.    Objective:   Inspection and palpation: paraspinal tenderness noted R low back, antalgic gait. Muscle tone and ROM exam: full range of motion with pain, antalgic gait. Straight leg raise: positive at 45 degrees on the left. Neurological: normal DTRs, muscle strength and reflexes.    Assessment:    Nonspecific acute low back pain    Plan:    Regular aerobic and trunk strengthening exercises discussed. Short (2-4 day) period of relative rest recommended until acute symptoms improve. Ice to affected area as needed for local pain relief. Heat to affected area as needed for local pain relief. Muscle relaxants per medication orders. Follow-up in 2 weeks.

## 2013-06-24 ENCOUNTER — Other Ambulatory Visit: Payer: BC Managed Care – PPO

## 2013-07-08 ENCOUNTER — Ambulatory Visit (INDEPENDENT_AMBULATORY_CARE_PROVIDER_SITE_OTHER): Payer: BC Managed Care – PPO | Admitting: Family Medicine

## 2013-07-08 ENCOUNTER — Encounter: Payer: Self-pay | Admitting: Family Medicine

## 2013-07-08 VITALS — BP 110/72 | HR 98 | Temp 98.3°F | Wt 248.6 lb

## 2013-07-08 DIAGNOSIS — R609 Edema, unspecified: Secondary | ICD-10-CM

## 2013-07-08 DIAGNOSIS — R002 Palpitations: Secondary | ICD-10-CM

## 2013-07-08 DIAGNOSIS — N951 Menopausal and female climacteric states: Secondary | ICD-10-CM

## 2013-07-08 DIAGNOSIS — E669 Obesity, unspecified: Secondary | ICD-10-CM | POA: Insufficient documentation

## 2013-07-08 DIAGNOSIS — Z0000 Encounter for general adult medical examination without abnormal findings: Secondary | ICD-10-CM

## 2013-07-08 DIAGNOSIS — R232 Flushing: Secondary | ICD-10-CM

## 2013-07-08 DIAGNOSIS — R319 Hematuria, unspecified: Secondary | ICD-10-CM

## 2013-07-08 DIAGNOSIS — R7989 Other specified abnormal findings of blood chemistry: Secondary | ICD-10-CM

## 2013-07-08 LAB — CBC WITH DIFFERENTIAL/PLATELET
Basophils Absolute: 0 10*3/uL (ref 0.0–0.1)
Basophils Relative: 0.5 % (ref 0.0–3.0)
Eosinophils Absolute: 0.3 10*3/uL (ref 0.0–0.7)
Eosinophils Relative: 5.3 % — ABNORMAL HIGH (ref 0.0–5.0)
HCT: 39 % (ref 36.0–46.0)
Hemoglobin: 13.1 g/dL (ref 12.0–15.0)
Lymphocytes Relative: 31.6 % (ref 12.0–46.0)
Lymphs Abs: 1.6 10*3/uL (ref 0.7–4.0)
MCHC: 33.7 g/dL (ref 30.0–36.0)
MCV: 90.9 fl (ref 78.0–100.0)
Monocytes Absolute: 0.4 10*3/uL (ref 0.1–1.0)
Monocytes Relative: 8.1 % (ref 3.0–12.0)
Neutro Abs: 2.7 10*3/uL (ref 1.4–7.7)
Neutrophils Relative %: 54.5 % (ref 43.0–77.0)
Platelets: 169 10*3/uL (ref 150.0–400.0)
RBC: 4.29 Mil/uL (ref 3.87–5.11)
RDW: 13 % (ref 11.5–14.6)
WBC: 5 10*3/uL (ref 4.5–10.5)

## 2013-07-08 LAB — POCT URINALYSIS DIPSTICK
Bilirubin, UA: NEGATIVE
Glucose, UA: NEGATIVE
Ketones, UA: NEGATIVE
Nitrite, UA: POSITIVE
Protein, UA: NEGATIVE
Spec Grav, UA: 1.02
Urobilinogen, UA: 0.2
pH, UA: 5

## 2013-07-08 LAB — CARDIAC PANEL
CK-MB: 1.1 ng/mL (ref 0.3–4.0)
Relative Index: 1.8 calc (ref 0.0–2.5)
Total CK: 61 U/L (ref 7–177)

## 2013-07-08 MED ORDER — HYDROCHLOROTHIAZIDE 25 MG PO TABS: 25.0000 mg | ORAL_TABLET | Freq: Every day | ORAL | Status: DC

## 2013-07-08 NOTE — Patient Instructions (Signed)
Palpitations  A palpitation is the feeling that your heartbeat is irregular or is faster than normal. It may feel like your heart is fluttering or skipping a beat. Palpitations are usually not a serious problem. However, in some cases, you may need further medical evaluation. CAUSES  Palpitations can be caused by:  Smoking.  Caffeine or other stimulants, such as diet pills or energy drinks.  Alcohol.  Stress and anxiety.  Strenuous physical activity.  Fatigue.  Certain medicines.  Heart disease, especially if you have a history of arrhythmias. This includes atrial fibrillation, atrial flutter, or supraventricular tachycardia.  An improperly working pacemaker or defibrillator. DIAGNOSIS  To find the cause of your palpitations, your caregiver will take your history and perform a physical exam. Tests may also be done, including:  Electrocardiography (ECG). This test records the heart's electrical activity.  Cardiac monitoring. This allows your caregiver to monitor your heart rate and rhythm in real time.  Holter monitor. This is a portable device that records your heartbeat and can help diagnose heart arrhythmias. It allows your caregiver to track your heart activity for several days, if needed.  Stress tests by exercise or by giving medicine that makes the heart beat faster. TREATMENT  Treatment of palpitations depends on the cause of your symptoms and can vary greatly. Most cases of palpitations do not require any treatment other than time, relaxation, and monitoring your symptoms. Other causes, such as atrial fibrillation, atrial flutter, or supraventricular tachycardia, usually require further treatment. HOME CARE INSTRUCTIONS   Avoid:  Caffeinated coffee, tea, soft drinks, diet pills, and energy drinks.  Chocolate.  Alcohol.  Stop smoking if you smoke.  Reduce your stress and anxiety. Things that can help you relax include:  A method that measures bodily functions so  you can learn to control them (biofeedback).  Yoga.  Meditation.  Physical activity such as swimming, jogging, or walking.  Get plenty of rest and sleep. SEEK MEDICAL CARE IF:   You continue to have a fast or irregular heartbeat beyond 24 hours.  Your palpitations occur more often. SEEK IMMEDIATE MEDICAL CARE IF:  You develop chest pain or shortness of breath.  You have a severe headache.  You feel dizzy, or you faint. MAKE SURE YOU:  Understand these instructions.  Will watch your condition.  Will get help right away if you are not doing well or get worse. Document Released: 11/02/2000 Document Revised: 05/06/2012 Document Reviewed: 01/04/2012 Exit[employer] Patient Information 2014 [employer].  

## 2013-07-08 NOTE — Progress Notes (Signed)
  Subjective:    Mikayla [name] is a 47 y.o. female who presents with palpitations and a skipping sensation. The symptoms are moderate, occur intermittently, and last several   minutes per episode. They tend to occur while having hot flashes. Cardiac risk factors include: family history of premature cardiovascular disease, obesity (BMI >= 30 kg/m2) and sedentary lifestyle. Aggravating factors: stress/anxiety, hot flashes. Relieving factors: spontaneous. Associated symptoms: palpitations and hot flashes. Patient denies: abdominal pain, calf pain, chest pain, cough, dizziness, fatigue, shortness of breath, slow heart beat and syncope.  The following portions of the patient's history were reviewed and updated as appropriate: allergies, current medications, past family history, past medical history, past social history, past surgical history and problem list.  Review of Systems Pertinent items are noted in HPI.   Objective:    BP 110/72  Pulse 98  Temp(Src) 98.3 F (36.8 C) (Oral)  Wt 248 lb 9.6 oz (112.764 kg)  BMI 44.72 kg/m2  SpO2 95% General appearance: alert, cooperative, appears stated age and no distress Neck: no adenopathy, supple, symmetrical, trachea midline and thyroid not enlarged, symmetric, no tenderness/mass/nodules Lungs: clear to auscultation bilaterally Heart: S1, S2 normal Extremities: extremities normal, atraumatic, no cyanosis or edema  Cardiographics ECG: normal sinus rhythm  , t wave changes in precordial leads   Assessment:    hot flashes with palpitations   Plan:     stress echo Check labs Follow up in 2 weeks.    If pain returns --go to ER

## 2013-07-10 ENCOUNTER — Other Ambulatory Visit: Payer: Self-pay | Admitting: Family Medicine

## 2013-07-10 ENCOUNTER — Telehealth: Payer: Self-pay

## 2013-07-10 DIAGNOSIS — R002 Palpitations: Secondary | ICD-10-CM

## 2013-07-10 LAB — HEPATIC FUNCTION PANEL
ALT: 19 U/L (ref 0–35)
AST: 16 U/L (ref 0–37)
Albumin: 3.8 g/dL (ref 3.5–5.2)
Alkaline Phosphatase: 52 U/L (ref 39–117)
Bilirubin, Direct: 0 mg/dL (ref 0.0–0.3)
Total Bilirubin: 0.6 mg/dL (ref 0.3–1.2)
Total Protein: 7.1 g/dL (ref 6.0–8.3)

## 2013-07-10 LAB — LIPID PANEL
Cholesterol: 228 mg/dL — ABNORMAL HIGH (ref 0–200)
HDL: 50.3 mg/dL (ref >39.00)
Total CHOL/HDL Ratio: 5
Triglycerides: 106 mg/dL (ref 0.0–149.0)
VLDL: 21.2 mg/dL (ref 0.0–40.0)

## 2013-07-10 LAB — BASIC METABOLIC PANEL
BUN: 17 mg/dL (ref 6–23)
CO2: 22 mEq/L (ref 19–32)
Calcium: 9 mg/dL (ref 8.4–10.5)
Chloride: 105 mEq/L (ref 96–112)
Creatinine, Ser: 0.5 mg/dL (ref 0.4–1.2)
GFR: 127.48 mL/min (ref >60.00)
Glucose, Bld: 69 mg/dL — ABNORMAL LOW (ref 70–99)
Potassium: 4.2 mEq/L (ref 3.5–5.1)
Sodium: 138 mEq/L (ref 135–145)

## 2013-07-10 LAB — LDL CHOLESTEROL, DIRECT: Direct LDL: 165.4 mg/dL

## 2013-07-10 LAB — FOLLICLE STIMULATING HORMONE: FSH: 4.2 m[IU]/mL

## 2013-07-10 LAB — TSH: TSH: 1.87 u[IU]/mL (ref 0.35–5.50)

## 2013-07-10 LAB — LUTEINIZING HORMONE: LH: 7.47 m[IU]/mL

## 2013-07-10 NOTE — Telephone Encounter (Signed)
I put referral in for cardiology to be done instead.

## 2013-07-10 NOTE — Telephone Encounter (Signed)
Call from [place] and she was unable to get the approval on the Echo. You will need to complete a Peer to Peer and the number is [phone].        KP

## 2013-07-10 NOTE — Telephone Encounter (Signed)
I spoke with Mikayla [name] and made her aware that Dr.Lowne declined the Peer to Peer and put in an order for Cardiology instead. Mikayla [name] will cancel the order and scheduled the Cardiology Evaluation.       KP

## 2013-07-11 ENCOUNTER — Other Ambulatory Visit: Payer: Self-pay | Admitting: Family Medicine

## 2013-07-11 DIAGNOSIS — N390 Urinary tract infection, site not specified: Secondary | ICD-10-CM

## 2013-07-11 LAB — URINE CULTURE: Colony Count: >=100000

## 2013-07-11 MED ORDER — CIPROFLOXACIN HCL 500 MG PO TABS: 500.0000 mg | ORAL_TABLET | Freq: Two times a day (BID) | ORAL | Status: DC

## 2013-07-12 LAB — ESTRADIOL: Estradiol: 78.6 pg/mL

## 2013-07-13 ENCOUNTER — Ambulatory Visit (INDEPENDENT_AMBULATORY_CARE_PROVIDER_SITE_OTHER): Payer: BC Managed Care – PPO | Admitting: Cardiovascular Disease

## 2013-07-13 ENCOUNTER — Encounter: Payer: Self-pay | Admitting: *Deleted

## 2013-07-13 VITALS — BP 118/80 | HR 96 | Ht 63.0 in | Wt 249.0 lb

## 2013-07-13 DIAGNOSIS — R0989 Other specified symptoms and signs involving the circulatory and respiratory systems: Secondary | ICD-10-CM

## 2013-07-13 DIAGNOSIS — R0609 Other forms of dyspnea: Secondary | ICD-10-CM

## 2013-07-13 DIAGNOSIS — R0600 Dyspnea, unspecified: Secondary | ICD-10-CM | POA: Insufficient documentation

## 2013-07-13 DIAGNOSIS — R079 Chest pain, unspecified: Secondary | ICD-10-CM

## 2013-07-13 DIAGNOSIS — R002 Palpitations: Secondary | ICD-10-CM

## 2013-07-13 DIAGNOSIS — R635 Abnormal weight gain: Secondary | ICD-10-CM

## 2013-07-13 NOTE — Assessment & Plan Note (Signed)
[facility] type low carb diet and meal plan discussed F/U primary

## 2013-07-13 NOTE — Assessment & Plan Note (Signed)
Benign sounding F/U event monitor as symptoms not daily and intermitant

## 2013-07-13 NOTE — Assessment & Plan Note (Signed)
Functional from weight gain.  F/U echo to assess RV and LV function

## 2013-07-13 NOTE — Progress Notes (Signed)
Patient ID: Mikayla [name], female   DOB: 05-Oct-1966, 47 y.o.   MRN: [id number] Mikayla [name] is a 47 y.o. female who presents with palpitations and a skipping sensation. The symptoms are moderate, occur intermittently, and last several minutes per episode. They tend to occur while having hot flashes. Cardiac risk factors include: family history of premature cardiovascular disease, obesity (BMI >= 30 kg/m2) and sedentary lifestyle. Aggravating factors: stress/anxiety, hot flashes. Relieving factors: spontaneous. Associated symptoms: palpitations and hot flashes. Patient denies: abdominal pain, calf pain,, cough, dizziness, fatigue, shortness of breath, slow heart beat and syncope.  Mother with MI at age 29  She has gained 40lbs over the last year.  Sedentary Has done well with low carb diet in past.  Notices episodes of spontaneous diaphoresis.  Hormone levels drawn 8/20 but not resulted yet.  Dyspnea and sensation of inappropriately high HR with exertion   Labs from 8/20 reviewed TC 228  LDL 165 TSH 1.87   ROS: Denies fever, malais, weight loss, blurry vision, decreased visual acuity, cough, sputum, SOB, hemoptysis, pleuritic pain, palpitaitons, heartburn, abdominal pain, melena, lower extremity edema, claudication, or rash.  All other systems reviewed and negative   General: Affect appropriate Obese white female HEENT: normal Neck supple with no adenopathy JVP normal no bruits no thyromegaly Lungs clear with no wheezing and good diaphragmatic motion Heart:  S1/S2 no murmur,rub, gallop or click PMI normal Abdomen: benighn, BS positve, no tenderness, no AAA no bruit.  No HSM or HJR Distal pulses intact with no bruits No edema Neuro non-focal Skin warm and dry No muscular weakness  Medications Current Outpatient Prescriptions  Medication Sig Dispense Refill  . cyclobenzaprine (FLEXERIL) 10 MG tablet Take 1 tablet (10 mg total) by mouth 3 (three) times daily as needed for muscle spasms.   30 tablet  0  . hydrochlorothiazide (HYDRODIURIL) 25 MG tablet Take 1 tablet (25 mg total) by mouth daily.  30 tablet  11   No current facility-administered medications for this visit.    Allergies Review of patient's allergies indicates no known allergies.  Family History: Family History  Problem Relation Age of Onset  . Heart attack Mother   . Coronary artery disease Mother     triple bypass age 2  . Diabetes Maternal Grandmother   . Lung cancer Mother   . Arthritis      both sides of family  . Depression      both sides of family  . Alcohol abuse      both sides of family  . Coronary artery disease Father     Social History: History   Social History  . Marital Status: Married    Spouse Name: N/A    Number of Children: N/A  . Years of Education: N/A   Occupational History  . Not on file.   Social History Main Topics  . Smoking status: Never Smoker   . Smokeless tobacco: Never Used  . Alcohol Use: No  . Drug Use: No  . Sexual Activity: Not on file   Other Topics Concern  . Not on file   Social History Narrative  . No narrative on file    Electrocardiogram:  07/08/13  NSR T low voltage otherwise normal rate 85   Assessment and Plan

## 2013-07-13 NOTE — Assessment & Plan Note (Signed)
Secondary to weight gain and dyspnea form being out of shape ECG ok  Premature CAD in mother.  F/U ETT to make sure exercise program ok

## 2013-07-13 NOTE — Patient Instructions (Signed)
Your physician recommends that you schedule a follow-up appointment in: AS NEEDED Your physician recommends that you continue on your current medications as directed. Please refer to the Current Medication list given to you today.  Your physician has requested that you have an echocardiogram. Echocardiography is a painless test that uses sound waves to create images of your heart. It provides your doctor with information about the size and shape of your heart and how well your heart's chambers and valves are working. This procedure takes approximately one hour. There are no restrictions for this procedure.  Your physician has recommended that you wear an event monitor. Event monitors are medical devices that record the heart's electrical activity. Doctors most often [place] these monitors to diagnose arrhythmias. Arrhythmias are problems with the speed or rhythm of the heartbeat. The monitor is a small, portable device. You can wear one while you do your normal daily activities. This is usually used to diagnose what is causing palpitations/syncope (passing out).  Your physician has requested that you have an exercise tolerance test. For further information please visit [email or web address] Please also follow instruction sheet, as given.

## 2013-07-14 ENCOUNTER — Ambulatory Visit (HOSPITAL_COMMUNITY): Payer: BC Managed Care – PPO | Attending: Cardiovascular Disease

## 2013-07-14 ENCOUNTER — Other Ambulatory Visit (HOSPITAL_COMMUNITY): Payer: BC Managed Care – PPO

## 2013-07-14 DIAGNOSIS — R072 Precordial pain: Secondary | ICD-10-CM

## 2013-07-14 DIAGNOSIS — R0989 Other specified symptoms and signs involving the circulatory and respiratory systems: Secondary | ICD-10-CM | POA: Insufficient documentation

## 2013-07-14 DIAGNOSIS — Z8249 Family history of ischemic heart disease and other diseases of the circulatory system: Secondary | ICD-10-CM | POA: Insufficient documentation

## 2013-07-14 DIAGNOSIS — E669 Obesity, unspecified: Secondary | ICD-10-CM | POA: Insufficient documentation

## 2013-07-14 DIAGNOSIS — R0609 Other forms of dyspnea: Secondary | ICD-10-CM | POA: Insufficient documentation

## 2013-07-14 DIAGNOSIS — R0600 Dyspnea, unspecified: Secondary | ICD-10-CM

## 2013-07-14 DIAGNOSIS — R079 Chest pain, unspecified: Secondary | ICD-10-CM

## 2013-07-14 DIAGNOSIS — I079 Rheumatic tricuspid valve disease, unspecified: Secondary | ICD-10-CM | POA: Insufficient documentation

## 2013-07-14 DIAGNOSIS — I359 Nonrheumatic aortic valve disorder, unspecified: Secondary | ICD-10-CM | POA: Insufficient documentation

## 2013-07-14 DIAGNOSIS — R002 Palpitations: Secondary | ICD-10-CM | POA: Insufficient documentation

## 2013-07-14 NOTE — Progress Notes (Signed)
Echocardiogram performed.  

## 2013-07-15 ENCOUNTER — Ambulatory Visit (HOSPITAL_BASED_OUTPATIENT_CLINIC_OR_DEPARTMENT_OTHER): Payer: BC Managed Care – PPO

## 2013-07-16 ENCOUNTER — Telehealth: Payer: Self-pay | Admitting: Cardiovascular Disease

## 2013-07-16 NOTE — Telephone Encounter (Signed)
PT AWARE OF ECHO RESULTS./CY 

## 2013-07-16 NOTE — Telephone Encounter (Signed)
F/up   Pt returning call// test results

## 2013-07-29 ENCOUNTER — Encounter: Payer: Self-pay | Admitting: *Deleted

## 2013-08-03 ENCOUNTER — Encounter: Payer: Self-pay | Admitting: *Deleted

## 2013-08-03 ENCOUNTER — Encounter: Payer: BC Managed Care – PPO | Admitting: Family Medicine

## 2013-08-03 ENCOUNTER — Ambulatory Visit (INDEPENDENT_AMBULATORY_CARE_PROVIDER_SITE_OTHER): Payer: BC Managed Care – PPO | Admitting: Physician Assistant

## 2013-08-03 ENCOUNTER — Encounter (INDEPENDENT_AMBULATORY_CARE_PROVIDER_SITE_OTHER): Payer: BC Managed Care – PPO

## 2013-08-03 ENCOUNTER — Encounter: Payer: BC Managed Care – PPO | Admitting: Physician Assistant

## 2013-08-03 ENCOUNTER — Encounter: Payer: Self-pay | Admitting: Family Medicine

## 2013-08-03 DIAGNOSIS — R079 Chest pain, unspecified: Secondary | ICD-10-CM

## 2013-08-03 DIAGNOSIS — R0602 Shortness of breath: Secondary | ICD-10-CM

## 2013-08-03 DIAGNOSIS — R002 Palpitations: Secondary | ICD-10-CM

## 2013-08-03 NOTE — Progress Notes (Signed)
Patient ID: Mikayla [name], female   DOB: [birth year], 47 y.o.   MRN: [id number] E-Cardio verite 30 day cardiac event monitor placed on patient.

## 2013-08-03 NOTE — Progress Notes (Signed)
Exercise Treadmill Test  Pre-Exercise Testing Evaluation Rhythm: normal sinus  Rate: 93     Test  Exercise Tolerance Test Ordering MD: Charlton Haws, MD  Interpreting MD: Tereso Newcomer, PA-C  Unique Test No: 1 Treadmill:  1  Indication for ETT: chest pain - rule out ischemia  Contraindication to ETT: No   Stress Modality: exercise - treadmill  Cardiac Imaging Performed: non   Protocol: standard Bruce - maximal  Max BP:  163/80  Max MPHR (bpm):  171 85% MPR (bpm):  145  MPHR obtained (bpm):  162 % MPHR obtained:  95  Reached 85% MPHR (min:sec):  5:29 Total Exercise Time (min-sec):  7:00  Workload in METS:  8.5 Borg Scale: 1  Reason ETT Terminated:  dyspnea    ST Segment Analysis At Rest: normal ST segments - no evidence of significant ST depression With Exercise: non-specific ST changes  Other Information Arrhythmia:  No Angina during ETT:  absent (0) Quality of ETT:  diagnostic  ETT Interpretation:  normal - no evidence of ischemia by ST analysis  Comments: Fair exercise tolerance. No chest pain.  She did note significant dyspnea.   Normal BP response to exercise. No ST-T changes to suggest ischemia.   Recommendations: F/u with Dr. Charlton Haws as directed. Signed,  Tereso Newcomer, PA-C   08/03/2013 11:36 AM

## 2013-08-05 ENCOUNTER — Other Ambulatory Visit: Payer: Self-pay

## 2013-08-05 DIAGNOSIS — Z1231 Encounter for screening mammogram for malignant neoplasm of breast: Secondary | ICD-10-CM

## 2013-08-13 ENCOUNTER — Telehealth: Payer: Self-pay

## 2013-08-13 NOTE — Telephone Encounter (Signed)
Reached patient but was in a meeting. Will CB in morning if time allows.  [id number] scheduled, due for flu vaccine and possible PAP.

## 2013-08-14 ENCOUNTER — Encounter: Payer: BC Managed Care – PPO | Admitting: Family Medicine

## 2013-08-14 NOTE — Telephone Encounter (Signed)
Unable to reach pre visit.  

## 2013-08-25 ENCOUNTER — Ambulatory Visit: Payer: BC Managed Care – PPO

## 2013-09-17 ENCOUNTER — Telehealth: Payer: Self-pay | Admitting: *Deleted

## 2013-09-17 NOTE — Telephone Encounter (Signed)
PT  AWARE OF MONITOR RESULTS  PER DR NISHAN  SR  SINUS  TACH  NO  SIG  ARRHYTHMIAS./CY 

## 2013-09-23 ENCOUNTER — Ambulatory Visit (INDEPENDENT_AMBULATORY_CARE_PROVIDER_SITE_OTHER): Payer: BC Managed Care – PPO | Admitting: Family

## 2013-09-23 ENCOUNTER — Encounter: Payer: Self-pay | Admitting: Family

## 2013-09-23 VITALS — BP 126/74 | HR 78 | Temp 98.3°F | Resp 16 | Ht 63.0 in | Wt 254.0 lb

## 2013-09-23 DIAGNOSIS — R300 Dysuria: Secondary | ICD-10-CM

## 2013-09-23 DIAGNOSIS — Z0000 Encounter for general adult medical examination without abnormal findings: Secondary | ICD-10-CM

## 2013-09-23 DIAGNOSIS — E785 Hyperlipidemia, unspecified: Secondary | ICD-10-CM

## 2013-09-23 DIAGNOSIS — E538 Deficiency of other specified B group vitamins: Secondary | ICD-10-CM

## 2013-09-23 DIAGNOSIS — Z23 Encounter for immunization: Secondary | ICD-10-CM

## 2013-09-23 DIAGNOSIS — N390 Urinary tract infection, site not specified: Secondary | ICD-10-CM

## 2013-09-23 LAB — POCT URINALYSIS DIPSTICK
Bilirubin, UA: NEGATIVE
Blood, UA: NEGATIVE
Glucose, UA: NEGATIVE
Leukocytes, UA: NEGATIVE
Nitrite, UA: NEGATIVE
Protein, UA: NEGATIVE
Spec Grav, UA: 1.02
Urobilinogen, UA: 0.2
pH, UA: 6

## 2013-09-23 MED ORDER — ATORVASTATIN CALCIUM 10 MG PO TABS: 10.0000 mg | ORAL_TABLET | Freq: Every day | ORAL | Status: DC

## 2013-09-23 MED ORDER — CIPROFLOXACIN HCL 500 MG PO TABS: 500.0000 mg | ORAL_TABLET | Freq: Two times a day (BID) | ORAL | Status: DC

## 2013-09-23 NOTE — Patient Instructions (Signed)
Start atorvastatin for cholesterol. Follow up for fasting lipid panel and liver function testing in 6-8 weeks. You will be contacted about your referral for nutrition and colonoscopy.  Follow up in 3 months for office visit.
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
## 2013-09-24 DIAGNOSIS — Z0000 Encounter for general adult medical examination without abnormal findings: Secondary | ICD-10-CM | POA: Insufficient documentation

## 2013-09-24 DIAGNOSIS — N390 Urinary tract infection, site not specified: Secondary | ICD-10-CM | POA: Insufficient documentation

## 2013-09-24 LAB — URINE CULTURE: Colony Count: 80000

## 2013-09-24 NOTE — Assessment & Plan Note (Signed)
We discussed healthy diet, exercise, weight loss.  She would like a referral to nutrition.  Refer for colonoscopy, pt to complete mammogram, flu shot today. Obtain fasting labs.

## 2013-09-24 NOTE — Assessment & Plan Note (Signed)
Send urine for culture.  Rx with cipro.

## 2013-09-25 MED ORDER — TRIAMCINOLONE ACETONIDE 0.5 % EX OINT: 1.0000 "application " | TOPICAL_OINTMENT | Freq: Two times a day (BID) | CUTANEOUS | Status: DC

## 2013-09-25 NOTE — Addendum Note (Signed)
Addended by: Sandford Craze on: 09/25/2013 11:44 AM   Modules accepted: Orders

## 2013-10-05 ENCOUNTER — Encounter: Payer: Self-pay | Admitting: Gastroenterology

## 2013-10-07 ENCOUNTER — Ambulatory Visit
Admission: RE | Admit: 2013-10-07 | Discharge: 2013-10-07 | Disposition: A | Discharge status: Home / Self Care | Payer: BC Managed Care – PPO | Source: Ambulatory Visit

## 2013-10-07 DIAGNOSIS — Z1231 Encounter for screening mammogram for malignant neoplasm of breast: Secondary | ICD-10-CM

## 2013-10-08 ENCOUNTER — Telehealth: Payer: Self-pay | Admitting: *Deleted

## 2013-10-08 NOTE — Telephone Encounter (Signed)
Pt left message that she still hasn't heard back from [place] re: nutrition referral. Per referral notes, nutrition center has received referral and pt is on list to be called.  Pt also states she would like to proceed with referral to Bariatric clinic.  Please advise.

## 2013-11-23 ENCOUNTER — Telehealth: Payer: Self-pay | Admitting: *Deleted

## 2013-11-23 NOTE — Telephone Encounter (Signed)
Called pt and advised she does not need colonoscopy until July 2015, advised pt we will put her in as recall and send her a letter closer to July 2015 to remind her of colonoscopy, cancelled procedure and pre-visit-adm

## 2013-11-23 NOTE — Telephone Encounter (Signed)
Dr. Christella HartiganJacobs, Please review this [facility].  Per report, she had a poor prep and doctor stated she will need propofol next procedure.  Had Fentanyl- 125, Versed- 12, and Benadryl  Do you want her changed to one of your propofol days and a double prep?  Thanks, W[employer] ResourcesKristen

## 2013-11-23 NOTE — Telephone Encounter (Signed)
She doesn't need colon cancer screening until 05/2014 *(routine risk).  At that time colonoscopy, double prep with MAC is a the best plan.  Not sure she needs it sooner than age 48.  If she feels she does, should have NGI appt with me.

## 2013-12-07 ENCOUNTER — Encounter: Payer: Self-pay | Admitting: Dietician

## 2013-12-07 ENCOUNTER — Encounter: Payer: BC Managed Care – PPO | Attending: Family | Admitting: Dietician

## 2013-12-07 VITALS — Ht 63.0 in | Wt 256.7 lb

## 2013-12-07 DIAGNOSIS — Z713 Dietary counseling and surveillance: Secondary | ICD-10-CM | POA: Insufficient documentation

## 2013-12-07 DIAGNOSIS — Z6841 Body Mass Index (BMI) 40.0 and over, adult: Secondary | ICD-10-CM | POA: Insufficient documentation

## 2013-12-07 DIAGNOSIS — E669 Obesity, unspecified: Secondary | ICD-10-CM

## 2013-12-07 NOTE — Patient Instructions (Addendum)
Add warm tea or another hot beverage to encourage bowel movement. Try Miralax when needed.  Fill up half your plate with non-starchy vegetables.  Increase fluid intake. Add fruit or Crystal Light or Mio drops for flavor.  Pick one starch per meal.  Continue to eat regularly and try not to skip meals.  Continue exercise regimen.  Add fruits during snack time. Carbs+protein to keep you fuller longer. Avoid getting too hungry.  Eat slowly and listen to hunger/fullness cues. Put fork down between bites. Sip water between bites.  Use a smaller plate to help keep portions small.

## 2013-12-07 NOTE — Progress Notes (Signed)
  Medical Nutrition Therapy:  Appt start time: 1530 end time:  1630.   Assessment:  Primary concerns today:  Mikayla [name] is here today for weight loss. She reports she has gained 40 pounds in the past 6 months. She reports knee and back pain as a result. Had previously lost 45 pounds. Tried Atkins Diet, Weight Watchers, Physicians weight loss. Mikayla [name] lives with her husband and son. She and her husband share food shopping and preparation responsibilities. They work independently managing hotels. Mikayla [name] reports that they work long hours. Husband is from [place] and the family eats a lot of pita bread and food is "oily." Skips about 1 meal per day but is trying to eat more regularly. Eats lunch at work. Eats breakfast at home and eats dinner very late (9:30-10 pm). Eats out 2-3 times per week. Constipation; sometimes goes 5-6 days without BM. 2 vitamins per day. Uses "cleansing herbs" and aloe tablets.   Weight history:  Prior to recent weight gain, her highest weight was 225 lb. She describes herself as "slim" as a child, but went from 115 lbs to 200 lbs with first pregnancy at 15.   Preferred Learning Style:  No preference indicated   Learning Readiness:  Ready  MEDICATIONS: none   DIETARY INTAKE:  Avoided foods include pork.  24-hr recall:  B ( AM):  Pita bread with wheat flour with eggs with fruit or salad (dark greens) sometimes L ( PM): Grilled chicken and salad with a little Ranch dressing D (9:30-10 PM): Shrimp scampi, fish, steamed white rice and yogurt  Beverages: 1/2 sweet 1/2 unswt tea; water; no sodas (32 oz a day)  Usual physical activity: Joined community center; does zumba and strength classes 4x a week with sister in law since January 4.   Estimated energy needs: 1800 calories 200 g carbohydrates 135 g protein 50 g fat  Progress Towards Goal(s):  In progress.   Nutritional Diagnosis:  Sheldon-3.3 Overweight/obesity As related to excess carbohydrate intake and large  portions.  As evidenced by 40 pound weight gain in the past 6 months and BMI 45.5.    Intervention:  Nutrition counseling provided.  Plan: Add warm tea or another hot beverage to encourage bowel movement. Try Miralax when needed. Fill up half your plate with non-starchy vegetables. Increase fluid intake. Add fruit or Crystal Light or Mio drops for flavor. Pick one starch per meal. Continue to eat regularly and try not to skip meals. Continue exercise regimen. Add fruits during snack time. Carbs+protein to keep you fuller longer. Avoid getting too hungry. Eat slowly and listen to hunger/fullness cues. Put fork down between bites. Sip water between bites. Use a smaller plate to help keep portions small.  Teaching Method Utilized: Visual Auditory Hands on  Handouts given during visit include:  Snacks  MyPlate  Barriers to learning/adherence to lifestyle change: stress at home and work per patient  Demonstrated degree of understanding via:  Teach Back   Monitoring/Evaluation:  Dietary intake, exercise, mindful eating, and body weight in 2 month(s).

## 2013-12-08 ENCOUNTER — Other Ambulatory Visit: Payer: BC Managed Care – PPO | Admitting: Gastroenterology

## 2013-12-22 ENCOUNTER — Encounter: Payer: Self-pay | Admitting: Internal Medicine

## 2013-12-22 ENCOUNTER — Ambulatory Visit (INDEPENDENT_AMBULATORY_CARE_PROVIDER_SITE_OTHER): Payer: BC Managed Care – PPO | Admitting: Internal Medicine

## 2013-12-22 VITALS — BP 90/64 | HR 88 | Temp 98.3°F | Wt 252.0 lb

## 2013-12-22 DIAGNOSIS — J069 Acute upper respiratory infection, unspecified: Secondary | ICD-10-CM

## 2013-12-22 MED ORDER — AZITHROMYCIN 250 MG PO TABS: ORAL_TABLET | ORAL | Status: DC

## 2013-12-22 NOTE — Progress Notes (Signed)
Pre visit review using our clinic review tool, if applicable. No additional management support is needed unless otherwise documented below in the visit note. 
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
## 2013-12-22 NOTE — Patient Instructions (Signed)
Rest, fluids , tylenol For cough, take Mucinex DM twice a day as needed  For congestion use OTC Nasocort: 2 nasal sprays on each side of the nose daily until you feel better Get pseudoephedrine 30 mg (behind the counter, you need to talk with the pharmacist) take one tablet 3 or 4 times a day as needed for congestion Take the antibiotic as prescribed  (zpack) Call if no better in few days Call anytime if the symptoms are severe  Check the  blood pressure 2 or 3 times a   week be sure it is between 110/60 and 140/85. Ideal blood pressure is 120/80. If it is consistently higher or lower, let me know

## 2014-02-08 ENCOUNTER — Ambulatory Visit: Payer: BC Managed Care – PPO | Admitting: Dietician

## 2014-03-03 ENCOUNTER — Encounter: Payer: Self-pay | Admitting: Gastroenterology

## 2014-04-28 ENCOUNTER — Encounter: Payer: Self-pay | Admitting: Physician Assistant

## 2014-04-28 ENCOUNTER — Ambulatory Visit (INDEPENDENT_AMBULATORY_CARE_PROVIDER_SITE_OTHER): Payer: BC Managed Care – PPO | Admitting: Physician Assistant

## 2014-04-28 VITALS — BP 110/82 | HR 90 | Temp 98.1°F | Resp 16 | Ht 63.0 in | Wt 251.5 lb

## 2014-04-28 DIAGNOSIS — J019 Acute sinusitis, unspecified: Secondary | ICD-10-CM

## 2014-04-28 MED ORDER — HYDROCOD POLST-CHLORPHEN POLST 10-8 MG/5ML PO LQCR: 5.0000 mL | Freq: Two times a day (BID) | ORAL | Status: DC | PRN

## 2014-04-28 MED ORDER — ALBUTEROL SULFATE HFA 108 (90 BASE) MCG/ACT IN AERS: 2.0000 | INHALATION_SPRAY | Freq: Four times a day (QID) | RESPIRATORY_TRACT | Status: DC | PRN

## 2014-04-28 MED ORDER — AZITHROMYCIN 250 MG PO TABS
ORAL_TABLET | ORAL | Status: DC
Start: 2014-04-28 — End: 2014-05-13
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
## 2014-04-28 NOTE — Assessment & Plan Note (Signed)
Rx Azithromycin.  Increase fluid intakes.  Tussionex for cough. Albuterol for wheeze. Increase fluids.  Rest.  Saline nasal spray.  Humidifier in bedroom.  Call or RTC if symptoms are not improving.

## 2014-04-28 NOTE — Patient Instructions (Signed)
Please take antibiotic as directed.  Increase fluid intake.  Use Saline nasal spray.  Take a daily multivitamin. Use Tussionex as directed for cough.  Albuterol at night for wheezing.  Place a humidifier in the bedroom.  Please call or return clinic if symptoms are not improving.  Sinusitis Sinusitis is redness, soreness, and swelling (inflammation) of the paranasal sinuses. Paranasal sinuses are air pockets within the bones of your face (beneath the eyes, the middle of the forehead, or above the eyes). In healthy paranasal sinuses, mucus is able to drain out, and air is able to circulate through them by way of your nose. However, when your paranasal sinuses are inflamed, mucus and air can become trapped. This can allow bacteria and other germs to grow and cause infection. Sinusitis can develop quickly and last only a short time (acute) or continue over a long period (chronic). Sinusitis that lasts for more than 12 weeks is considered chronic.  CAUSES  Causes of sinusitis include:  Allergies.  Structural abnormalities, such as displacement of the cartilage that separates your nostrils (deviated septum), which can decrease the air flow through your nose and sinuses and affect sinus drainage.  Functional abnormalities, such as when the small hairs (cilia) that line your sinuses and help remove mucus do not work properly or are not present. SYMPTOMS  Symptoms of acute and chronic sinusitis are the same. The primary symptoms are pain and pressure around the affected sinuses. Other symptoms include:  Upper toothache.  Earache.  Headache.  Bad breath.  Decreased sense of smell and taste.  A cough, which worsens when you are lying flat.  Fatigue.  Fever.  Thick drainage from your nose, which often is green and may contain pus (purulent).  Swelling and warmth over the affected sinuses. DIAGNOSIS  Your caregiver will perform a physical exam. During the exam, your caregiver may:  Look in  your nose for signs of abnormal growths in your nostrils (nasal polyps).  Tap over the affected sinus to check for signs of infection.  View the inside of your sinuses (endoscopy) with a special imaging device with a light attached (endoscope), which is inserted into your sinuses. If your caregiver suspects that you have chronic sinusitis, one or more of the following tests may be recommended:  Allergy tests.  Nasal culture A sample of mucus is taken from your nose and sent to a lab and screened for bacteria.  Nasal cytology A sample of mucus is taken from your nose and examined by your caregiver to determine if your sinusitis is related to an allergy. TREATMENT  Most cases of acute sinusitis are related to a viral infection and will resolve on their own within 10 days. Sometimes medicines are prescribed to help relieve symptoms (pain medicine, decongestants, nasal steroid sprays, or saline sprays).  However, for sinusitis related to a bacterial infection, your caregiver will prescribe antibiotic medicines. These are medicines that will help kill the bacteria causing the infection.  Rarely, sinusitis is caused by a fungal infection. In theses cases, your caregiver will prescribe antifungal medicine. For some cases of chronic sinusitis, surgery is needed. Generally, these are cases in which sinusitis recurs more than 3 times per year, despite other treatments. HOME CARE INSTRUCTIONS   Drink plenty of water. Water helps thin the mucus so your sinuses can drain more easily.  Use a humidifier.  Inhale steam 3 to 4 times a day (for example, sit in the bathroom with the shower running).  Apply a  warm, moist washcloth to your face 3 to 4 times a day, or as directed by your caregiver.  Use saline nasal sprays to help moisten and clean your sinuses.  Take over-the-counter or prescription medicines for pain, discomfort, or fever only as directed by your caregiver. SEEK IMMEDIATE MEDICAL CARE  IF:  You have increasing pain or severe headaches.  You have nausea, vomiting, or drowsiness.  You have swelling around your face.  You have vision problems.  You have a stiff neck.  You have difficulty breathing. MAKE SURE YOU:   Understand these instructions.  Will watch your condition.  Will get help right away if you are not doing well or get worse. Document Released: 11/05/2005 Document Revised: 01/28/2012 Document Reviewed: 11/20/2011 [facility] Patient Information 2014 [place], [place].

## 2014-04-28 NOTE — Progress Notes (Signed)
Pre visit review using our clinic review tool, if applicable. No additional management support is needed unless otherwise documented below in the visit note/SLS  

## 2014-05-13 ENCOUNTER — Ambulatory Visit (INDEPENDENT_AMBULATORY_CARE_PROVIDER_SITE_OTHER): Payer: BC Managed Care – PPO | Admitting: Physician Assistant

## 2014-05-13 VITALS — BP 104/82 | HR 90 | Temp 98.5°F | Resp 16 | Ht 63.0 in | Wt 254.2 lb

## 2014-05-13 DIAGNOSIS — J209 Acute bronchitis, unspecified: Secondary | ICD-10-CM

## 2014-05-13 DIAGNOSIS — Z87440 Personal history of urinary (tract) infections: Secondary | ICD-10-CM

## 2014-05-13 LAB — BASIC METABOLIC PANEL
BUN: 19 mg/dL (ref 6–23)
CO2: 28 mEq/L (ref 19–32)
Calcium: 9.2 mg/dL (ref 8.4–10.5)
Chloride: 105 mEq/L (ref 96–112)
Creat: 0.64 mg/dL (ref 0.50–1.10)
Glucose, Bld: 138 mg/dL — ABNORMAL HIGH (ref 70–99)
Potassium: 4.2 mEq/L (ref 3.5–5.3)
Sodium: 141 mEq/L (ref 135–145)

## 2014-05-13 LAB — CBC
HCT: 36.2 % (ref 36.0–46.0)
Hemoglobin: 12.3 g/dL (ref 12.0–15.0)
MCH: 30.4 pg (ref 26.0–34.0)
MCHC: 34 g/dL (ref 30.0–36.0)
MCV: 89.4 fL (ref 78.0–100.0)
Platelets: 180 10*3/uL (ref 150–400)
RBC: 4.05 MIL/uL (ref 3.87–5.11)
RDW: 13.4 % (ref 11.5–15.5)
WBC: 5 10*3/uL (ref 4.0–10.5)

## 2014-05-13 MED ORDER — BENZONATATE 100 MG PO CAPS: 100.0000 mg | ORAL_CAPSULE | Freq: Two times a day (BID) | ORAL | Status: DC | PRN

## 2014-05-13 MED ORDER — CIPROFLOXACIN HCL 500 MG PO TABS: 500.0000 mg | ORAL_TABLET | Freq: Two times a day (BID) | ORAL | Status: DC
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
## 2014-05-13 NOTE — Progress Notes (Signed)
Pre visit review using our clinic review tool, if applicable. No additional management support is needed unless otherwise documented below in the visit note/SLS  

## 2014-05-13 NOTE — Patient Instructions (Signed)
Please obtain labs.  I will call you with your results.  Increase fluids. Take antibiotic as directed.  Use Tessalon for cough.  Take a daily claritin.  Use saline nasal spray.  Rest.  Place a humidifier in the bedroom. Call or return to clinic if symptoms are not improving.

## 2014-05-14 LAB — URINALYSIS, ROUTINE W REFLEX MICROSCOPIC
Bilirubin Urine: NEGATIVE
Glucose, UA: NEGATIVE mg/dL
Hgb urine dipstick: NEGATIVE
Ketones, ur: NEGATIVE mg/dL
Leukocytes, UA: NEGATIVE
Nitrite: NEGATIVE
Protein, ur: NEGATIVE mg/dL
Specific Gravity, Urine: 1.022 (ref 1.005–1.030)
Urobilinogen, UA: 0.2 mg/dL (ref 0.0–1.0)
pH: 5 (ref 5.0–8.0)

## 2014-05-16 ENCOUNTER — Encounter: Payer: Self-pay | Admitting: Physician Assistant

## 2014-05-16 DIAGNOSIS — J209 Acute bronchitis, unspecified: Secondary | ICD-10-CM | POA: Insufficient documentation

## 2014-05-16 NOTE — Assessment & Plan Note (Signed)
Rx Ciprofloxacin BID.  Increase fluids.  Rest.  Saline nasal spray.  Rx Tessalon for cough.  Humidifier in bedroom.

## 2014-08-02 ENCOUNTER — Other Ambulatory Visit: Payer: Self-pay | Admitting: Family Medicine

## 2014-09-27 ENCOUNTER — Encounter: Payer: BC Managed Care – PPO | Admitting: Family

## 2014-10-01 ENCOUNTER — Encounter: Payer: BC Managed Care – PPO | Admitting: Family

## 2014-10-05 ENCOUNTER — Ambulatory Visit (INDEPENDENT_AMBULATORY_CARE_PROVIDER_SITE_OTHER): Payer: BC Managed Care – PPO | Admitting: Medical

## 2014-10-05 ENCOUNTER — Encounter: Payer: Self-pay | Admitting: Medical

## 2014-10-05 VITALS — BP 122/85 | HR 94 | Temp 98.1°F | Wt 247.0 lb

## 2014-10-05 DIAGNOSIS — R062 Wheezing: Secondary | ICD-10-CM | POA: Insufficient documentation

## 2014-10-05 DIAGNOSIS — J0100 Acute maxillary sinusitis, unspecified: Secondary | ICD-10-CM

## 2014-10-05 DIAGNOSIS — H6502 Acute serous otitis media, left ear: Secondary | ICD-10-CM

## 2014-10-05 DIAGNOSIS — H6690 Otitis media, unspecified, unspecified ear: Secondary | ICD-10-CM | POA: Insufficient documentation

## 2014-10-05 MED ORDER — ALBUTEROL SULFATE HFA 108 (90 BASE) MCG/ACT IN AERS: 2.0000 | INHALATION_SPRAY | Freq: Four times a day (QID) | RESPIRATORY_TRACT | Status: DC | PRN

## 2014-10-05 MED ORDER — AZITHROMYCIN 250 MG PO TABS: ORAL_TABLET | ORAL | Status: DC

## 2014-10-05 MED ORDER — BENZONATATE 200 MG PO CAPS: 200.0000 mg | ORAL_CAPSULE | Freq: Three times a day (TID) | ORAL | Status: DC | PRN

## 2014-10-05 NOTE — Patient Instructions (Addendum)
Your appear to have sinusitis and bronchitis presently. In addition your rt ear looks early infected.(Azithromycin should treat om, sinusitis, and bronchtis).  For your wheezing,  I am making albuterol inhaler available.  I am prescribing azithromycin antibiotic, benzonatate for cough and albuterol inhaler.  Follow up in 7 days or as needed.

## 2014-10-05 NOTE — Assessment & Plan Note (Signed)
Azithromycin rx

## 2014-10-05 NOTE — Assessment & Plan Note (Signed)
Albuterol inhaler
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
## 2014-10-05 NOTE — Assessment & Plan Note (Signed)
Your appear to have sinusitis and bronchitis presently

## 2014-10-05 NOTE — Progress Notes (Signed)
Pre visit review using our clinic review tool, if applicable. No additional management support is needed unless otherwise documented below in the visit note. 

## 2014-10-25 ENCOUNTER — Other Ambulatory Visit: Payer: Self-pay

## 2014-10-25 DIAGNOSIS — Z1231 Encounter for screening mammogram for malignant neoplasm of breast: Secondary | ICD-10-CM

## 2014-10-26 ENCOUNTER — Encounter: Payer: Self-pay | Admitting: Physician Assistant

## 2014-10-26 ENCOUNTER — Ambulatory Visit (INDEPENDENT_AMBULATORY_CARE_PROVIDER_SITE_OTHER): Payer: BC Managed Care – PPO | Admitting: Physician Assistant

## 2014-10-26 ENCOUNTER — Telehealth: Payer: Self-pay | Admitting: Family

## 2014-10-26 VITALS — BP 117/76 | HR 76 | Temp 97.9°F | Wt 250.0 lb

## 2014-10-26 DIAGNOSIS — L409 Psoriasis, unspecified: Secondary | ICD-10-CM

## 2014-10-26 DIAGNOSIS — L309 Dermatitis, unspecified: Secondary | ICD-10-CM

## 2014-10-26 DIAGNOSIS — E538 Deficiency of other specified B group vitamins: Secondary | ICD-10-CM

## 2014-10-26 DIAGNOSIS — Z0000 Encounter for general adult medical examination without abnormal findings: Secondary | ICD-10-CM

## 2014-10-26 MED ORDER — CLOBETASOL PROPIONATE 0.05 % EX OINT: 1.0000 "application " | TOPICAL_OINTMENT | Freq: Two times a day (BID) | CUTANEOUS | Status: DC

## 2014-10-26 NOTE — Telephone Encounter (Signed)
Caller name:Tidd, Curtis Relation to UJ:WJXBpt:self Call back [phone] Pharmacy:  Reason for call: pt made appt for Thursday for labs, please put the order in for her.

## 2014-10-26 NOTE — Assessment & Plan Note (Signed)
Rx Clobetasol ointment to apply BID. Supportive measures discussed.
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
## 2014-10-26 NOTE — Progress Notes (Signed)
Pre visit review using our clinic review tool, if applicable. No additional management support is needed unless otherwise documented below in the visit note. 

## 2014-10-26 NOTE — Telephone Encounter (Signed)
Orders placed.

## 2014-10-26 NOTE — Patient Instructions (Signed)
Use Clobetasol Ointment twice daily as directed.  Apply Eucerin or Aquaphor moisturizing lotion to hands throughout the day.  Use gentle detergent when washing dishes or hands.  You will be contacted by Dermatologist for assessment of the possible psoriasis underneath the nails.  I will see you next week for your physical.

## 2014-10-26 NOTE — Assessment & Plan Note (Signed)
-   Referral to Dermatology placed

## 2014-10-28 ENCOUNTER — Other Ambulatory Visit (INDEPENDENT_AMBULATORY_CARE_PROVIDER_SITE_OTHER): Payer: BC Managed Care – PPO

## 2014-10-28 DIAGNOSIS — Z0000 Encounter for general adult medical examination without abnormal findings: Secondary | ICD-10-CM

## 2014-10-28 DIAGNOSIS — E538 Deficiency of other specified B group vitamins: Secondary | ICD-10-CM

## 2014-10-28 LAB — BASIC METABOLIC PANEL
BUN: 14 mg/dL (ref 6–23)
CO2: 23 mEq/L (ref 19–32)
Calcium: 8.8 mg/dL (ref 8.4–10.5)
Chloride: 107 mEq/L (ref 96–112)
Creatinine, Ser: 0.6 mg/dL (ref 0.4–1.2)
GFR: 124.15 mL/min (ref >60.00)
Glucose, Bld: 92 mg/dL (ref 70–99)
Potassium: 3.9 mEq/L (ref 3.5–5.1)
Sodium: 135 mEq/L (ref 135–145)

## 2014-10-28 LAB — URINALYSIS, ROUTINE W REFLEX MICROSCOPIC
Bilirubin Urine: NEGATIVE
Hgb urine dipstick: NEGATIVE
Ketones, ur: NEGATIVE
Leukocytes, UA: NEGATIVE
Nitrite: NEGATIVE
Specific Gravity, Urine: 1.02 (ref 1.000–1.030)
Total Protein, Urine: NEGATIVE
Urine Glucose: NEGATIVE
Urobilinogen, UA: 0.2 (ref 0.0–1.0)
pH: 7.5 (ref 5.0–8.0)

## 2014-10-28 LAB — CBC
HCT: 37.6 % (ref 36.0–46.0)
Hemoglobin: 12.4 g/dL (ref 12.0–15.0)
MCHC: 33 g/dL (ref 30.0–36.0)
MCV: 91.2 fl (ref 78.0–100.0)
Platelets: 174 10*3/uL (ref 150.0–400.0)
RBC: 4.12 Mil/uL (ref 3.87–5.11)
RDW: 13.8 % (ref 11.5–15.5)
WBC: 5.4 10*3/uL (ref 4.0–10.5)

## 2014-10-28 LAB — LIPID PANEL
Cholesterol: 195 mg/dL (ref 0–200)
HDL: 43.7 mg/dL (ref >39.00)
LDL Cholesterol: 128 mg/dL — ABNORMAL HIGH (ref 0–99)
NonHDL: 151.3
Total CHOL/HDL Ratio: 4
Triglycerides: 117 mg/dL (ref 0.0–149.0)
VLDL: 23.4 mg/dL (ref 0.0–40.0)

## 2014-10-28 LAB — HEPATIC FUNCTION PANEL
ALT: 17 U/L (ref 0–35)
AST: 15 U/L (ref 0–37)
Albumin: 3.6 g/dL (ref 3.5–5.2)
Alkaline Phosphatase: 58 U/L (ref 39–117)
Bilirubin, Direct: 0 mg/dL (ref 0.0–0.3)
Total Bilirubin: 0.4 mg/dL (ref 0.2–1.2)
Total Protein: 7 g/dL (ref 6.0–8.3)

## 2014-10-28 LAB — VITAMIN D 25 HYDROXY (VIT D DEFICIENCY, FRACTURES): VITD: 18.72 ng/mL — ABNORMAL LOW (ref 30.00–100.00)

## 2014-10-28 LAB — TSH: TSH: 2.67 u[IU]/mL (ref 0.35–4.50)

## 2014-10-28 LAB — VITAMIN B12: Vitamin B-12: 187 pg/mL — ABNORMAL LOW (ref 211–911)

## 2014-10-28 LAB — HEMOGLOBIN A1C: Hgb A1c MFr Bld: 6.1 % (ref 4.6–6.5)

## 2014-10-29 ENCOUNTER — Telehealth: Payer: Self-pay | Admitting: Physician Assistant

## 2014-10-29 NOTE — Telephone Encounter (Signed)
Will discuss results with patient at her CPE.  Telephone encounter opened in error.

## 2014-11-01 ENCOUNTER — Encounter: Payer: Self-pay | Admitting: Physician Assistant

## 2014-11-01 ENCOUNTER — Ambulatory Visit (INDEPENDENT_AMBULATORY_CARE_PROVIDER_SITE_OTHER): Payer: BC Managed Care – PPO | Admitting: Physician Assistant

## 2014-11-01 ENCOUNTER — Ambulatory Visit (INDEPENDENT_AMBULATORY_CARE_PROVIDER_SITE_OTHER): Payer: BC Managed Care – PPO | Admitting: *Deleted

## 2014-11-01 VITALS — BP 124/70 | HR 90 | Temp 98.3°F | Resp 16 | Ht 63.0 in | Wt 251.4 lb

## 2014-11-01 DIAGNOSIS — Z23 Encounter for immunization: Secondary | ICD-10-CM

## 2014-11-01 DIAGNOSIS — Z0000 Encounter for general adult medical examination without abnormal findings: Secondary | ICD-10-CM | POA: Insufficient documentation

## 2014-11-01 DIAGNOSIS — E559 Vitamin D deficiency, unspecified: Secondary | ICD-10-CM

## 2014-11-01 DIAGNOSIS — Z1211 Encounter for screening for malignant neoplasm of colon: Secondary | ICD-10-CM

## 2014-11-01 DIAGNOSIS — E538 Deficiency of other specified B group vitamins: Secondary | ICD-10-CM | POA: Insufficient documentation

## 2014-11-01 MED ORDER — VITAMIN D (ERGOCALCIFEROL) 1.25 MG (50000 UNIT) PO CAPS: 50000.0000 [IU] | ORAL_CAPSULE | ORAL | Status: DC
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
## 2014-11-01 NOTE — Assessment & Plan Note (Signed)
Mildly diminished.  Will attempt 9613-month trial of oral supplementation.  Will recheck levels in 2 months.

## 2014-11-01 NOTE — Progress Notes (Signed)
Pre visit review using our clinic review tool, if applicable. No additional management support is needed unless otherwise documented below in the visit note/SLS  

## 2014-11-01 NOTE — Assessment & Plan Note (Signed)
I have reviewed the patient's medical history in detail and updated the computerized patient record.  Patient up-to-date on Mammogram.  Is followed by OB/GYN for routine pelvic and breat examinations. Will be having repeat colonoscopy.  PHQ-9 screening negative.  Lab results reviewed with patient and plan given. Preventive care discussed and handout given.

## 2014-11-01 NOTE — Assessment & Plan Note (Signed)
Rx Ergocalciferol 50,000 units -- Take 1 tablet PO every 7 days for 8 weeks.  Will repeat levels and determine new regimen.

## 2014-11-01 NOTE — Patient Instructions (Signed)
Please begin the Vitamin D supplement (Ergocalciferol) as directed for 2 months.  Start an over-the-counter Vitamin B12 supplement. Follow-up in 2 months to recheck these levels.  We will alter regimen based on results.    Please consider starting a daily probiotic.  Some examples are Culturelle, Align, Digestive Advantage or Activia.   You will be contacted for a repeat Colonoscopy.  Follow-up with Melissa as scheduled.  Preventive Care for Adults A healthy lifestyle and preventive care can promote health and wellness. Preventive health guidelines for women include the following key practices.  A routine yearly physical is a good way to check with your health care provider about your health and preventive screening. It is a chance to share any concerns and updates on your health and to receive a thorough exam.  Visit your dentist for a routine exam and preventive care every 6 months. Brush your teeth twice a day and floss once a day. Good oral hygiene prevents tooth decay and gum disease.  The frequency of eye exams is based on your age, health, family medical history, use of contact lenses, and other factors. Follow your health care provider's recommendations for frequency of eye exams.  Eat a healthy diet. Foods like vegetables, fruits, whole grains, low-fat dairy products, and lean protein foods contain the nutrients you need without too many calories. Decrease your intake of foods high in solid fats, added sugars, and salt. Eat the right amount of calories for you.Get information about a proper diet from your health care provider, if necessary.  Regular physical exercise is one of the most important things you can do for your health. Most adults should get at least 150 minutes of moderate-intensity exercise (any activity that increases your heart rate and causes you to sweat) each week. In addition, most adults need muscle-strengthening exercises on 2 or more days a week.  Maintain a  healthy weight. The body mass index (BMI) is a screening tool to identify possible weight problems. It provides an estimate of body fat based on height and weight. Your health care provider can find your BMI and can help you achieve or maintain a healthy weight.For adults 20 years and older:  A BMI below 18.5 is considered underweight.  A BMI of 18.5 to 24.9 is normal.  A BMI of 25 to 29.9 is considered overweight.  A BMI of 30 and above is considered obese.  Maintain normal blood lipids and cholesterol levels by exercising and minimizing your intake of saturated fat. Eat a balanced diet with plenty of fruit and vegetables. Blood tests for lipids and cholesterol should begin at age 59 and be repeated every 5 years. If your lipid or cholesterol levels are high, you are over 50, or you are at high risk for heart disease, you may need your cholesterol levels checked more frequently.Ongoing high lipid and cholesterol levels should be treated with medicines if diet and exercise are not working.  If you smoke, find out from your health care provider how to quit. If you do not use tobacco, do not start.  Lung cancer screening is recommended for adults aged 54-80 years who are at high risk for developing lung cancer because of a history of smoking. A yearly low-dose CT scan of the lungs is recommended for people who have at least a 30-pack-year history of smoking and are a current smoker or have quit within the past 15 years. A pack year of smoking is smoking an average of 1 pack of cigarettes  a day for 1 year (for example: 1 pack a day for 30 years or 2 packs a day for 15 years). Yearly screening should continue until the smoker has stopped smoking for at least 15 years. Yearly screening should be stopped for people who develop a health problem that would prevent them from having lung cancer treatment.  If you are pregnant, do not drink alcohol. If you are breastfeeding, be very cautious about drinking  alcohol. If you are not pregnant and choose to drink alcohol, do not have more than 1 drink per day. One drink is considered to be 12 ounces (355 mL) of beer, 5 ounces (148 mL) of wine, or 1.5 ounces (44 mL) of liquor.  Avoid use of street drugs. Do not share needles with anyone. Ask for help if you need support or instructions about stopping the use of drugs.  High blood pressure causes heart disease and increases the risk of stroke. Your blood pressure should be checked at least every 1 to 2 years. Ongoing high blood pressure should be treated with medicines if weight loss and exercise do not work.  If you are 86-48 years old, ask your health care provider if you should take aspirin to prevent strokes.  Diabetes screening involves taking a blood sample to check your fasting blood sugar level. This should be done once every 3 years, after age 34, if you are within normal weight and without risk factors for diabetes. Testing should be considered at a younger age or be carried out more frequently if you are overweight and have at least 1 risk factor for diabetes.  Breast cancer screening is essential preventive care for women. You should practice "breast self-awareness." This means understanding the normal appearance and feel of your breasts and may include breast self-examination. Any changes detected, no matter how small, should be reported to a health care provider. Women in their 26s and 30s should have a clinical breast exam (CBE) by a health care provider as part of a regular health exam every 1 to 3 years. After age 43, women should have a CBE every year. Starting at age 69, women should consider having a mammogram (breast X-ray test) every year. Women who have a family history of breast cancer should talk to their health care provider about genetic screening. Women at a high risk of breast cancer should talk to their health care providers about having an MRI and a mammogram every year.  Breast  cancer gene (BRCA)-related cancer risk assessment is recommended for women who have family members with BRCA-related cancers. BRCA-related cancers include breast, ovarian, tubal, and peritoneal cancers. Having family members with these cancers may be associated with an increased risk for harmful changes (mutations) in the breast cancer genes BRCA1 and BRCA2. Results of the assessment will determine the need for genetic counseling and BRCA1 and BRCA2 testing.  Routine pelvic exams to screen for cancer are no longer recommended for nonpregnant women who are considered low risk for cancer of the pelvic organs (ovaries, uterus, and vagina) and who do not have symptoms. Ask your health care provider if a screening pelvic exam is right for you.  If you have had past treatment for cervical cancer or a condition that could lead to cancer, you need Pap tests and screening for cancer for at least 20 years after your treatment. If Pap tests have been discontinued, your risk factors (such as having a new sexual partner) need to be reassessed to determine if screening should  be resumed. Some women have medical problems that increase the chance of getting cervical cancer. In these cases, your health care provider may recommend more frequent screening and Pap tests.  The HPV test is an additional test that may be used for cervical cancer screening. The HPV test looks for the virus that can cause the cell changes on the cervix. The cells collected during the Pap test can be tested for HPV. The HPV test could be used to screen women aged 43 years and older, and should be used in women of any age who have unclear Pap test results. After the age of 46, women should have HPV testing at the same frequency as a Pap test.  Colorectal cancer can be detected and often prevented. Most routine colorectal cancer screening begins at the age of 21 years and continues through age 96 years. However, your health care provider may recommend  screening at an earlier age if you have risk factors for colon cancer. On a yearly basis, your health care provider may provide home test kits to check for hidden blood in the stool. Use of a small camera at the end of a tube, to directly examine the colon (sigmoidoscopy or colonoscopy), can detect the earliest forms of colorectal cancer. Talk to your health care provider about this at age 22, when routine screening begins. Direct exam of the colon should be repeated every 5-10 years through age 8 years, unless early forms of pre-cancerous polyps or small growths are found.  People who are at an increased risk for hepatitis B should be screened for this virus. You are considered at high risk for hepatitis B if:  You were born in a country where hepatitis B occurs often. Talk with your health care provider about which countries are considered high risk.  Your parents were born in a high-risk country and you have not received a shot to protect against hepatitis B (hepatitis B vaccine).  You have HIV or AIDS.  You use needles to inject street drugs.  You live with, or have sex with, someone who has hepatitis B.  You get hemodialysis treatment.  You take certain medicines for conditions like cancer, organ transplantation, and autoimmune conditions.  Hepatitis C blood testing is recommended for all people born from [birth year] through 1965 and any individual with known risks for hepatitis C.  Practice safe sex. Use condoms and avoid high-risk sexual practices to reduce the spread of sexually transmitted infections (STIs). STIs include gonorrhea, chlamydia, syphilis, trichomonas, herpes, HPV, and human immunodeficiency virus (HIV). Herpes, HIV, and HPV are viral illnesses that have no cure. They can result in disability, cancer, and death.  You should be screened for sexually transmitted illnesses (STIs) including gonorrhea and chlamydia if:  You are sexually active and are younger than 24 years.  You  are older than 24 years and your health care provider tells you that you are at risk for this type of infection.  Your sexual activity has changed since you were last screened and you are at an increased risk for chlamydia or gonorrhea. Ask your health care provider if you are at risk.  If you are at risk of being infected with HIV, it is recommended that you take a prescription medicine daily to prevent HIV infection. This is called preexposure prophylaxis (PrEP). You are considered at risk if:  You are a heterosexual woman, are sexually active, and are at increased risk for HIV infection.  You take drugs by injection.  You are sexually active with a partner who has HIV.  Talk with your health care provider about whether you are at high risk of being infected with HIV. If you choose to begin PrEP, you should first be tested for HIV. You should then be tested every 3 months for as long as you are taking PrEP.  Osteoporosis is a disease in which the bones lose minerals and strength with aging. This can result in serious bone fractures or breaks. The risk of osteoporosis can be identified using a bone density scan. Women ages 78 years and over and women at risk for fractures or osteoporosis should discuss screening with their health care providers. Ask your health care provider whether you should take a calcium supplement or vitamin D to reduce the rate of osteoporosis.  Menopause can be associated with physical symptoms and risks. Hormone replacement therapy is available to decrease symptoms and risks. You should talk to your health care provider about whether hormone replacement therapy is right for you.  Use sunscreen. Apply sunscreen liberally and repeatedly throughout the day. You should seek shade when your shadow is shorter than you. Protect yourself by wearing long sleeves, pants, a wide-brimmed hat, and sunglasses year round, whenever you are outdoors.  Once a month, do a whole body skin  exam, using a mirror to look at the skin on your back. Tell your health care provider of new moles, moles that have irregular borders, moles that are larger than a pencil eraser, or moles that have changed in shape or color.  Stay current with required vaccines (immunizations).  Influenza vaccine. All adults should be immunized every year.  Tetanus, diphtheria, and acellular pertussis (Td, Tdap) vaccine. Pregnant women should receive 1 dose of Tdap vaccine during each pregnancy. The dose should be obtained regardless of the length of time since the last dose. Immunization is preferred during the 27th-36th week of gestation. An adult who has not previously received Tdap or who does not know her vaccine status should receive 1 dose of Tdap. This initial dose should be followed by tetanus and diphtheria toxoids (Td) booster doses every 10 years. Adults with an unknown or incomplete history of completing a 3-dose immunization series with Td-containing vaccines should begin or complete a primary immunization series including a Tdap dose. Adults should receive a Td booster every 10 years.  Varicella vaccine. An adult without evidence of immunity to varicella should receive 2 doses or a second dose if she has previously received 1 dose. Pregnant females who do not have evidence of immunity should receive the first dose after pregnancy. This first dose should be obtained before leaving the health care facility. The second dose should be obtained 4-8 weeks after the first dose.  Human papillomavirus (HPV) vaccine. Females aged 13-26 years who have not received the vaccine previously should obtain the 3-dose series. The vaccine is not recommended for use in pregnant females. However, pregnancy testing is not needed before receiving a dose. If a female is found to be pregnant after receiving a dose, no treatment is needed. In that case, the remaining doses should be delayed until after the pregnancy. Immunization is  recommended for any person with an immunocompromised condition through the age of 50 years if she did not get any or all doses earlier. During the 3-dose series, the second dose should be obtained 4-8 weeks after the first dose. The third dose should be obtained 24 weeks after the first dose and 16 weeks after the  second dose.  Zoster vaccine. One dose is recommended for adults aged 44 years or older unless certain conditions are present.  Measles, mumps, and rubella (MMR) vaccine. Adults born before [birth year] generally are considered immune to measles and mumps. Adults born in [birth year] or later should have 1 or more doses of MMR vaccine unless there is a contraindication to the vaccine or there is laboratory evidence of immunity to each of the three diseases. A routine second dose of MMR vaccine should be obtained at least 28 days after the first dose for students attending postsecondary schools, health care workers, or international travelers. People who received inactivated measles vaccine or an unknown type of measles vaccine during 1963-1967 should receive 2 doses of MMR vaccine. People who received inactivated mumps vaccine or an unknown type of mumps vaccine before 1979 and are at high risk for mumps infection should consider immunization with 2 doses of MMR vaccine. For females of childbearing age, rubella immunity should be determined. If there is no evidence of immunity, females who are not pregnant should be vaccinated. If there is no evidence of immunity, females who are pregnant should delay immunization until after pregnancy. Unvaccinated health care workers born before [birth year] who lack laboratory evidence of measles, mumps, or rubella immunity or laboratory confirmation of disease should consider measles and mumps immunization with 2 doses of MMR vaccine or rubella immunization with 1 dose of MMR vaccine.  Pneumococcal 13-valent conjugate (PCV13) vaccine. When indicated, a person who is uncertain of her  immunization history and has no record of immunization should receive the PCV13 vaccine. An adult aged 77 years or older who has certain medical conditions and has not been previously immunized should receive 1 dose of PCV13 vaccine. This PCV13 should be followed with a dose of pneumococcal polysaccharide (PPSV23) vaccine. The PPSV23 vaccine dose should be obtained at least 8 weeks after the dose of PCV13 vaccine. An adult aged 11 years or older who has certain medical conditions and previously received 1 or more doses of PPSV23 vaccine should receive 1 dose of PCV13. The PCV13 vaccine dose should be obtained 1 or more years after the last PPSV23 vaccine dose.  Pneumococcal polysaccharide (PPSV23) vaccine. When PCV13 is also indicated, PCV13 should be obtained first. All adults aged 76 years and older should be immunized. An adult younger than age 58 years who has certain medical conditions should be immunized. Any person who resides in a nursing home or long-term care facility should be immunized. An adult smoker should be immunized. People with an immunocompromised condition and certain other conditions should receive both PCV13 and PPSV23 vaccines. People with human immunodeficiency virus (HIV) infection should be immunized as soon as possible after diagnosis. Immunization during chemotherapy or radiation therapy should be avoided. Routine use of PPSV23 vaccine is not recommended for American Indians, [place]atives, or people younger than 65 years unless there are medical conditions that require PPSV23 vaccine. When indicated, people who have unknown immunization and have no record of immunization should receive PPSV23 vaccine. One-time revaccination 5 years after the first dose of PPSV23 is recommended for people aged 19-64 years who have chronic kidney failure, nephrotic syndrome, asplenia, or immunocompromised conditions. People who received 1-2 doses of PPSV23 before age 9 years should receive another  dose of PPSV23 vaccine at age 64 years or later if at least 5 years have passed since the previous dose. Doses of PPSV23 are not needed for people immunized with PPSV23 at or after age 37  years.  Meningococcal vaccine. Adults with asplenia or persistent complement component deficiencies should receive 2 doses of quadrivalent meningococcal conjugate (MenACWY-D) vaccine. The doses should be obtained at least 2 months apart. Microbiologists working with certain meningococcal bacteria, [place] recruits, people at risk during an outbreak, and people who travel to or live in countries with a high rate of meningitis should be immunized. A first-year college student up through age 12 years who is living in a residence hall should receive a dose if she did not receive a dose on or after her 16th birthday. Adults who have certain high-risk conditions should receive one or more doses of vaccine.  Hepatitis A vaccine. Adults who wish to be protected from this disease, have certain high-risk conditions, work with hepatitis A-infected animals, work in hepatitis A research labs, or travel to or work in countries with a high rate of hepatitis A should be immunized. Adults who were previously unvaccinated and who anticipate close contact with an international adoptee during the first 60 days after arrival in the [place] from a country with a high rate of hepatitis A should be immunized.  Hepatitis B vaccine. Adults who wish to be protected from this disease, have certain high-risk conditions, may be exposed to blood or other infectious body fluids, are household contacts or sex partners of hepatitis B positive people, are clients or workers in certain care facilities, or travel to or work in countries with a high rate of hepatitis B should be immunized.  Haemophilus influenzae type b (Hib) vaccine. A previously unvaccinated person with asplenia or sickle cell disease or having a scheduled splenectomy should receive 1  dose of Hib vaccine. Regardless of previous immunization, a recipient of a hematopoietic stem cell transplant should receive a 3-dose series 6-12 months after her successful transplant. Hib vaccine is not recommended for adults with HIV infection. Preventive Services / Frequency Ages 89 to 19 years  Blood pressure check.** / Every 1 to 2 years.  Lipid and cholesterol check.** / Every 5 years beginning at age 13.  Clinical breast exam.** / Every 3 years for women in their 100s and 44s.  BRCA-related cancer risk assessment.** / For women who have family members with a BRCA-related cancer (breast, ovarian, tubal, or peritoneal cancers).  Pap test.** / Every 2 years from ages 84 through 79. Every 3 years starting at age 95 through age 71 or 65 with a history of 3 consecutive normal Pap tests.  HPV screening.** / Every 3 years from ages 84 through ages 71 to 31 with a history of 3 consecutive normal Pap tests.  Hepatitis C blood test.** / For any individual with known risks for hepatitis C.  Skin self-exam. / Monthly.  Influenza vaccine. / Every year.  Tetanus, diphtheria, and acellular pertussis (Tdap, Td) vaccine.** / Consult your health care provider. Pregnant women should receive 1 dose of Tdap vaccine during each pregnancy. 1 dose of Td every 10 years.  Varicella vaccine.** / Consult your health care provider. Pregnant females who do not have evidence of immunity should receive the first dose after pregnancy.  HPV vaccine. / 3 doses over 6 months, if 106 and younger. The vaccine is not recommended for use in pregnant females. However, pregnancy testing is not needed before receiving a dose.  Measles, mumps, rubella (MMR) vaccine.** / You need at least 1 dose of MMR if you were born in 1957 or later. You may also need a 2nd dose. For females of childbearing age, rubella  immunity should be determined. If there is no evidence of immunity, females who are not pregnant should be vaccinated. If  there is no evidence of immunity, females who are pregnant should delay immunization until after pregnancy.  Pneumococcal 13-valent conjugate (PCV13) vaccine.** / Consult your health care provider.  Pneumococcal polysaccharide (PPSV23) vaccine.** / 1 to 2 doses if you smoke cigarettes or if you have certain conditions.  Meningococcal vaccine.** / 1 dose if you are age 28 to 18 years and a Market researcher living in a residence hall, or have one of several medical conditions, you need to get vaccinated against meningococcal disease. You may also need additional booster doses.  Hepatitis A vaccine.** / Consult your health care provider.  Hepatitis B vaccine.** / Consult your health care provider.  Haemophilus influenzae type b (Hib) vaccine.** / Consult your health care provider. Ages 70 to 85 years  Blood pressure check.** / Every 1 to 2 years.  Lipid and cholesterol check.** / Every 5 years beginning at age 47 years.  Lung cancer screening. / Every year if you are aged 35-80 years and have a 30-pack-year history of smoking and currently smoke or have quit within the past 15 years. Yearly screening is stopped once you have quit smoking for at least 15 years or develop a health problem that would prevent you from having lung cancer treatment.  Clinical breast exam.** / Every year after age 73 years.  BRCA-related cancer risk assessment.** / For women who have family members with a BRCA-related cancer (breast, ovarian, tubal, or peritoneal cancers).  Mammogram.** / Every year beginning at age 93 years and continuing for as long as you are in good health. Consult with your health care provider.  Pap test.** / Every 3 years starting at age 35 years through age 33 or 31 years with a history of 3 consecutive normal Pap tests.  HPV screening.** / Every 3 years from ages 49 years through ages 59 to 26 years with a history of 3 consecutive normal Pap tests.  Fecal occult blood test  (FOBT) of stool. / Every year beginning at age 58 years and continuing until age 57 years. You may not need to do this test if you get a colonoscopy every 10 years.  Flexible sigmoidoscopy or colonoscopy.** / Every 5 years for a flexible sigmoidoscopy or every 10 years for a colonoscopy beginning at age 43 years and continuing until age 49 years.  Hepatitis C blood test.** / For all people born from [birth year] through 1967 and any individual with known risks for hepatitis C.  Skin self-exam. / Monthly.  Influenza vaccine. / Every year.  Tetanus, diphtheria, and acellular pertussis (Tdap/Td) vaccine.** / Consult your health care provider. Pregnant women should receive 1 dose of Tdap vaccine during each pregnancy. 1 dose of Td every 10 years.  Varicella vaccine.** / Consult your health care provider. Pregnant females who do not have evidence of immunity should receive the first dose after pregnancy.  Zoster vaccine.** / 1 dose for adults aged 56 years or older.  Measles, mumps, rubella (MMR) vaccine.** / You need at least 1 dose of MMR if you were born in 1957 or later. You may also need a 2nd dose. For females of childbearing age, rubella immunity should be determined. If there is no evidence of immunity, females who are not pregnant should be vaccinated. If there is no evidence of immunity, females who are pregnant should delay immunization until after pregnancy.  Pneumococcal 13-valent conjugate (  PCV13) vaccine.** / Consult your health care provider.  Pneumococcal polysaccharide (PPSV23) vaccine.** / 1 to 2 doses if you smoke cigarettes or if you have certain conditions.  Meningococcal vaccine.** / Consult your health care provider.  Hepatitis A vaccine.** / Consult your health care provider.  Hepatitis B vaccine.** / Consult your health care provider.  Haemophilus influenzae type b (Hib) vaccine.** / Consult your health care provider. Ages 40 years and over  Blood pressure check.** / Every  1 to 2 years.  Lipid and cholesterol check.** / Every 5 years beginning at age 36 years.  Lung cancer screening. / Every year if you are aged 69-80 years and have a 30-pack-year history of smoking and currently smoke or have quit within the past 15 years. Yearly screening is stopped once you have quit smoking for at least 15 years or develop a health problem that would prevent you from having lung cancer treatment.  Clinical breast exam.** / Every year after age 73 years.  BRCA-related cancer risk assessment.** / For women who have family members with a BRCA-related cancer (breast, ovarian, tubal, or peritoneal cancers).  Mammogram.** / Every year beginning at age 70 years and continuing for as long as you are in good health. Consult with your health care provider.  Pap test.** / Every 3 years starting at age 72 years through age 15 or 54 years with 3 consecutive normal Pap tests. Testing can be stopped between 65 and 70 years with 3 consecutive normal Pap tests and no abnormal Pap or HPV tests in the past 10 years.  HPV screening.** / Every 3 years from ages 24 years through ages 61 or 43 years with a history of 3 consecutive normal Pap tests. Testing can be stopped between 65 and 70 years with 3 consecutive normal Pap tests and no abnormal Pap or HPV tests in the past 10 years.  Fecal occult blood test (FOBT) of stool. / Every year beginning at age 18 years and continuing until age 57 years. You may not need to do this test if you get a colonoscopy every 10 years.  Flexible sigmoidoscopy or colonoscopy.** / Every 5 years for a flexible sigmoidoscopy or every 10 years for a colonoscopy beginning at age 52 years and continuing until age 58 years.  Hepatitis C blood test.** / For all people born from [birth year] through 1967 and any individual with known risks for hepatitis C.  Osteoporosis screening.** / A one-time screening for women ages 21 years and over and women at risk for fractures or  osteoporosis.  Skin self-exam. / Monthly.  Influenza vaccine. / Every year.  Tetanus, diphtheria, and acellular pertussis (Tdap/Td) vaccine.** / 1 dose of Td every 10 years.  Varicella vaccine.** / Consult your health care provider.  Zoster vaccine.** / 1 dose for adults aged 60 years or older.  Pneumococcal 13-valent conjugate (PCV13) vaccine.** / Consult your health care provider.  Pneumococcal polysaccharide (PPSV23) vaccine.** / 1 dose for all adults aged 29 years and older.  Meningococcal vaccine.** / Consult your health care provider.  Hepatitis A vaccine.** / Consult your health care provider.  Hepatitis B vaccine.** / Consult your health care provider.  Haemophilus influenzae type b (Hib) vaccine.** / Consult your health care provider. ** Family history and personal history of risk and conditions may change your health care provider's recommendations. Document Released: 01/01/2002 Document Revised: 03/22/2014 Document Reviewed: 04/02/2011 [facility] Patient Information 2015 [place], [place]. This information is not intended to replace advice given to you  by your health care provider. Make sure you discuss any questions you have with your health care provider.

## 2014-11-01 NOTE — Assessment & Plan Note (Signed)
Referral to GI placed for repeat scoping.

## 2014-11-16 ENCOUNTER — Ambulatory Visit: Payer: BC Managed Care – PPO

## 2014-12-08 ENCOUNTER — Encounter: Payer: Self-pay | Admitting: Family

## 2014-12-08 ENCOUNTER — Ambulatory Visit (INDEPENDENT_AMBULATORY_CARE_PROVIDER_SITE_OTHER): Payer: BLUE CROSS/BLUE SHIELD | Admitting: Family

## 2014-12-08 VITALS — BP 108/74 | HR 84 | Temp 98.0°F | Resp 16 | Ht 63.0 in | Wt 251.0 lb

## 2014-12-08 DIAGNOSIS — E559 Vitamin D deficiency, unspecified: Secondary | ICD-10-CM

## 2014-12-08 DIAGNOSIS — E669 Obesity, unspecified: Secondary | ICD-10-CM

## 2014-12-08 MED ORDER — LORAZEPAM 0.5 MG PO TABS: 0.5000 mg | ORAL_TABLET | Freq: Every evening | ORAL | Status: DC | PRN

## 2014-12-08 NOTE — Patient Instructions (Addendum)
Please complete lab work prior to leaving. UDS Reduce carbs (no more than one carb per meal) increase non-starchy vegetables (salads etc.) Try to watch your portions. Try to get 30 minutes a day of walking. Follow up in April so we can recheck your A1C and check your progress.

## 2014-12-08 NOTE — Assessment & Plan Note (Signed)
Begin reducing portion size and regular exercise-at least 30 minutes 5 days per week. Follow up in three months.

## 2014-12-08 NOTE — Assessment & Plan Note (Signed)
Instructed that she may take OTC Vit D if cramps are too bothersome. She states she would like to finish out the weekly vit D prescription.

## 2014-12-08 NOTE — Progress Notes (Signed)
Pre visit review using our clinic review tool, if applicable. No additional management support is needed unless otherwise documented below in the visit note. 
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
## 2014-12-10 ENCOUNTER — Ambulatory Visit: Payer: Self-pay | Admitting: Family

## 2014-12-24 ENCOUNTER — Ambulatory Visit (INDEPENDENT_AMBULATORY_CARE_PROVIDER_SITE_OTHER): Payer: BLUE CROSS/BLUE SHIELD | Admitting: Physician Assistant

## 2014-12-24 ENCOUNTER — Encounter: Payer: Self-pay | Admitting: Physician Assistant

## 2014-12-24 VITALS — BP 104/54 | HR 81 | Temp 98.0°F | Resp 16 | Ht 63.0 in | Wt 252.4 lb

## 2014-12-24 DIAGNOSIS — H6982 Other specified disorders of Eustachian tube, left ear: Secondary | ICD-10-CM

## 2014-12-24 DIAGNOSIS — G509 Disorder of trigeminal nerve, unspecified: Secondary | ICD-10-CM

## 2014-12-24 MED ORDER — METHYLPREDNISOLONE ACETATE 40 MG/ML IJ SUSP
40.0000 mg | Freq: Once | INTRAMUSCULAR | Status: AC
  Administered 2014-12-24: 40 mg via INTRAMUSCULAR
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
## 2014-12-24 NOTE — Progress Notes (Signed)
Pre visit review using our clinic review tool, if applicable. No additional management support is needed unless otherwise documented below in the visit note/SLS  

## 2014-12-24 NOTE — Patient Instructions (Signed)
Please start a daily Claritin and Flonase to help remove fluid behind ear and to resolve dizzy symptoms. I think the other symptoms are from some irritation of your trigeminal nerve.  The steroid given today should help with things.  If symptoms are not continuing to improve, please let me know so we can proceed with a further workup.

## 2014-12-28 DIAGNOSIS — H6980 Other specified disorders of Eustachian tube, unspecified ear: Secondary | ICD-10-CM | POA: Insufficient documentation

## 2014-12-28 DIAGNOSIS — G509 Disorder of trigeminal nerve, unspecified: Secondary | ICD-10-CM | POA: Insufficient documentation

## 2014-12-28 NOTE — Assessment & Plan Note (Signed)
Encouraged daily claritin or zyrtec and Flonase each morning to help open tubes and relieve fluid and pressure.

## 2014-12-28 NOTE — Assessment & Plan Note (Signed)
Rapidly improving per patient.  Still some mild difference in sensorium.  Suspect is some type of post-viral insult giving other findings today.  Will continue to monitor.  Follow-up in 1 week if not completely resolved.

## 2015-03-30 ENCOUNTER — Telehealth: Payer: Self-pay

## 2015-03-30 ENCOUNTER — Telehealth: Payer: Self-pay | Admitting: *Deleted

## 2015-03-30 ENCOUNTER — Ambulatory Visit (INDEPENDENT_AMBULATORY_CARE_PROVIDER_SITE_OTHER): Payer: BLUE CROSS/BLUE SHIELD | Admitting: Family

## 2015-03-30 ENCOUNTER — Encounter: Payer: Self-pay | Admitting: Family

## 2015-03-30 VITALS — BP 98/62 | HR 75 | Temp 98.5°F | Resp 16 | Ht 63.0 in | Wt 260.2 lb

## 2015-03-30 DIAGNOSIS — R739 Hyperglycemia, unspecified: Secondary | ICD-10-CM | POA: Diagnosis not present

## 2015-03-30 DIAGNOSIS — R0602 Shortness of breath: Secondary | ICD-10-CM | POA: Diagnosis not present

## 2015-03-30 DIAGNOSIS — R202 Paresthesia of skin: Secondary | ICD-10-CM | POA: Diagnosis not present

## 2015-03-30 DIAGNOSIS — E559 Vitamin D deficiency, unspecified: Secondary | ICD-10-CM

## 2015-03-30 DIAGNOSIS — R635 Abnormal weight gain: Secondary | ICD-10-CM | POA: Diagnosis not present

## 2015-03-30 DIAGNOSIS — E538 Deficiency of other specified B group vitamins: Secondary | ICD-10-CM

## 2015-03-30 DIAGNOSIS — M7989 Other specified soft tissue disorders: Secondary | ICD-10-CM

## 2015-03-30 DIAGNOSIS — R079 Chest pain, unspecified: Secondary | ICD-10-CM

## 2015-03-30 LAB — TSH: TSH: 1.95 u[IU]/mL (ref 0.35–4.50)

## 2015-03-30 LAB — VITAMIN D 25 HYDROXY (VIT D DEFICIENCY, FRACTURES): VITD: 20.89 ng/mL — ABNORMAL LOW (ref 30.00–100.00)

## 2015-03-30 LAB — CBC WITH DIFFERENTIAL/PLATELET
Basophils Absolute: 0 10*3/uL (ref 0.0–0.1)
Basophils Relative: 0.4 % (ref 0.0–3.0)
Eosinophils Absolute: 0.2 10*3/uL (ref 0.0–0.7)
Eosinophils Relative: 3.5 % (ref 0.0–5.0)
HCT: 39.2 % (ref 36.0–46.0)
Hemoglobin: 13.1 g/dL (ref 12.0–15.0)
Lymphocytes Relative: 29.4 % (ref 12.0–46.0)
Lymphs Abs: 1.5 10*3/uL (ref 0.7–4.0)
MCHC: 33.5 g/dL (ref 30.0–36.0)
MCV: 89.2 fl (ref 78.0–100.0)
Monocytes Absolute: 0.4 10*3/uL (ref 0.1–1.0)
Monocytes Relative: 7.8 % (ref 3.0–12.0)
Neutro Abs: 3 10*3/uL (ref 1.4–7.7)
Neutrophils Relative %: 58.9 % (ref 43.0–77.0)
Platelets: 194 10*3/uL (ref 150.0–400.0)
RBC: 4.39 Mil/uL (ref 3.87–5.11)
RDW: 13.8 % (ref 11.5–15.5)
WBC: 5.1 10*3/uL (ref 4.0–10.5)

## 2015-03-30 LAB — VITAMIN B12: Vitamin B-12: 410 pg/mL (ref 211–911)

## 2015-03-30 LAB — FOLATE: Folate: 12.8 ng/mL (ref >5.9)

## 2015-03-30 LAB — D-DIMER, QUANTITATIVE: D-Dimer, Quant: 0.39 ug/mL-FEU (ref 0.00–0.48)

## 2015-03-30 LAB — HEMOGLOBIN A1C: Hgb A1c MFr Bld: 5.8 % (ref 4.6–6.5)

## 2015-03-30 MED ORDER — LORAZEPAM 0.5 MG PO TABS: 0.5000 mg | ORAL_TABLET | Freq: Every evening | ORAL | Status: DC | PRN

## 2015-03-30 NOTE — Assessment & Plan Note (Signed)
Obtain follow up b12 level.  

## 2015-03-30 NOTE — Patient Instructions (Signed)
Please complete lab work prior to leaving. Complete ultrasound on the first floor prior to leaving. You will be contacted about your stress test. Follow up in 2 weeks.

## 2015-03-30 NOTE — Assessment & Plan Note (Signed)
Check vit D level. 

## 2015-03-30 NOTE — Assessment & Plan Note (Signed)
Discussed exercise and weight loss. Obtain TSH due to ongoing weight gain.

## 2015-03-30 NOTE — Telephone Encounter (Signed)
Called [facility] at 1:30pm. Was told that reports was faxed to [phone] at 11:30am but she would refax it. Still have not received either copy. Called again and requested result be faxed to nurses station. She states only technicians can give a verbal and they are all with pt's.  Awaiting fax.
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
## 2015-03-30 NOTE — Telephone Encounter (Signed)
Copy was received around 3:30pm and given to PCP. She states u/s negative for DVT and d-dimer normal. Advised pt of results and to continue watching diet and get regular exercise as recent weight gain could be causing current symptoms. Notified pt.

## 2015-03-30 NOTE — Progress Notes (Signed)
Pre visit review using our clinic review tool, if applicable. No additional management support is needed unless otherwise documented below in the visit note. 

## 2015-03-30 NOTE — Assessment & Plan Note (Signed)
Most likely cause is secondary to weight gain and deconditioning, however she is having some atypical CP and LE edema as well. Will obtian D Dimer to rule out PE, bilateral LE doppler to rule out DVT Refer for myoview due to CP and family hx of heart disease.  EKG today is reviewed and notes no acute ischemic changes. Obtain cbc to rule out anemia as contributing cause to SOB.

## 2015-03-30 NOTE — Telephone Encounter (Signed)
[facility] Stat d-Dimer-> 0.39

## 2015-03-31 ENCOUNTER — Encounter: Payer: Self-pay | Admitting: Family

## 2015-03-31 ENCOUNTER — Other Ambulatory Visit: Payer: Self-pay | Admitting: Family

## 2015-03-31 DIAGNOSIS — E559 Vitamin D deficiency, unspecified: Secondary | ICD-10-CM

## 2015-03-31 MED ORDER — VITAMIN D (ERGOCALCIFEROL) 1.25 MG (50000 UNIT) PO CAPS: 50000.0000 [IU] | ORAL_CAPSULE | ORAL | Status: DC

## 2015-04-07 ENCOUNTER — Ambulatory Visit (HOSPITAL_COMMUNITY): Payer: BLUE CROSS/BLUE SHIELD

## 2015-04-12 ENCOUNTER — Encounter: Payer: Self-pay | Admitting: Family

## 2015-04-12 ENCOUNTER — Ambulatory Visit (INDEPENDENT_AMBULATORY_CARE_PROVIDER_SITE_OTHER): Payer: BLUE CROSS/BLUE SHIELD | Admitting: Family

## 2015-04-12 VITALS — BP 100/70 | HR 74 | Temp 97.7°F | Resp 18 | Ht 63.0 in | Wt 262.0 lb

## 2015-04-12 DIAGNOSIS — K5900 Constipation, unspecified: Secondary | ICD-10-CM | POA: Insufficient documentation

## 2015-04-12 DIAGNOSIS — R0602 Shortness of breath: Secondary | ICD-10-CM | POA: Diagnosis not present

## 2015-04-12 MED ORDER — FUROSEMIDE 20 MG PO TABS: 20.0000 mg | ORAL_TABLET | Freq: Every day | ORAL | Status: DC

## 2015-04-12 NOTE — Assessment & Plan Note (Addendum)
Unchanged. Patient canceled stress test due to death in the family. I advised her to reschedule. Will also get a 2D echo (has not had since 2014).  Add trial of low dose lasix. Plan PFT's next visit.
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
## 2015-04-12 NOTE — Progress Notes (Signed)
Pre visit review using our clinic review tool, if applicable. No additional management support is needed unless otherwise documented below in the visit note. 

## 2015-04-12 NOTE — Patient Instructions (Addendum)
Start lasix 20mg  once daily. Follow up in 2 weeks. Reschedule your stress test. [phone] You should be contacted about your echocardiogram.

## 2015-04-12 NOTE — Assessment & Plan Note (Signed)
Advised pt to ensure 64 oz water/day, increase fiber in det.

## 2015-04-14 NOTE — Telephone Encounter (Signed)
pls contact pt re: unread message. 

## 2015-04-15 MED ORDER — VITAMIN D (ERGOCALCIFEROL) 1.25 MG (50000 UNIT) PO CAPS: 50000.0000 [IU] | ORAL_CAPSULE | ORAL | Status: DC

## 2015-04-15 NOTE — Telephone Encounter (Signed)
Left detailed message on home# and to call pharmacy to fill Vitamin D rx from 03/31/15 if she has not already done so and schedule lab appt in 12 weeks and let us know if she has any questions.

## 2015-04-26 ENCOUNTER — Ambulatory Visit: Payer: BLUE CROSS/BLUE SHIELD | Admitting: Family

## 2015-05-03 ENCOUNTER — Encounter: Payer: Self-pay | Admitting: Internal Medicine

## 2015-05-03 ENCOUNTER — Ambulatory Visit (INDEPENDENT_AMBULATORY_CARE_PROVIDER_SITE_OTHER): Payer: BLUE CROSS/BLUE SHIELD | Admitting: Internal Medicine

## 2015-05-03 VITALS — BP 112/68 | HR 72 | Temp 97.8°F | Ht 63.0 in | Wt 263.4 lb

## 2015-05-03 DIAGNOSIS — R399 Unspecified symptoms and signs involving the genitourinary system: Secondary | ICD-10-CM | POA: Diagnosis not present

## 2015-05-03 DIAGNOSIS — R1012 Left upper quadrant pain: Secondary | ICD-10-CM

## 2015-05-03 LAB — URINALYSIS, ROUTINE W REFLEX MICROSCOPIC
Bilirubin Urine: NEGATIVE
Hgb urine dipstick: NEGATIVE
Ketones, ur: NEGATIVE
Nitrite: NEGATIVE
Specific Gravity, Urine: 1.015 (ref 1.000–1.030)
Total Protein, Urine: NEGATIVE
Urine Glucose: NEGATIVE
Urobilinogen, UA: 0.2 (ref 0.0–1.0)
pH: 6 (ref 5.0–8.0)

## 2015-05-03 LAB — POCT URINALYSIS DIPSTICK
Bilirubin, UA: NEGATIVE
Blood, UA: NEGATIVE
Glucose, UA: NEGATIVE
Ketones, UA: NEGATIVE
Nitrite, UA: NEGATIVE
Protein, UA: NEGATIVE
Spec Grav, UA: 1.02
Urobilinogen, UA: 0.2
pH, UA: 6

## 2015-05-03 MED ORDER — CIPROFLOXACIN HCL 500 MG PO TABS: 500.0000 mg | ORAL_TABLET | Freq: Two times a day (BID) | ORAL | Status: DC

## 2015-05-03 NOTE — Patient Instructions (Signed)
Drink plenty of fluids Cipro for 3 days  Will schedule a CT of the abdomen
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
## 2015-05-03 NOTE — Progress Notes (Signed)
Pre visit review using our clinic review tool, if applicable. No additional management support is needed unless otherwise documented below in the visit note. 

## 2015-05-04 ENCOUNTER — Telehealth: Payer: Self-pay | Admitting: Family

## 2015-05-04 NOTE — Telephone Encounter (Signed)
FYI. Dr. Drue Novel will place orders tonight, she should hear from someone tomorrow or Friday to schedule an appt for her CT. Thank you.

## 2015-05-04 NOTE — Telephone Encounter (Signed)
Caller name: Soraya Relation to pt: Call back number: [phone] Pharmacy:  Reason for call:   Patient thought that Dr Drue Novel was going to order her a CT. I don't see one in orders.

## 2015-05-04 NOTE — Telephone Encounter (Signed)
Please advise 

## 2015-05-04 NOTE — Telephone Encounter (Signed)
Informed patient of this.  °

## 2015-05-04 NOTE — Telephone Encounter (Signed)
Yes, I saw her yesterday, will enter order today. If she is worse let me know

## 2015-05-05 LAB — URINE CULTURE
Colony Count: NO GROWTH
Organism ID, Bacteria: NO GROWTH

## 2015-05-06 ENCOUNTER — Encounter (HOSPITAL_BASED_OUTPATIENT_CLINIC_OR_DEPARTMENT_OTHER): Payer: Self-pay

## 2015-05-06 ENCOUNTER — Ambulatory Visit (HOSPITAL_BASED_OUTPATIENT_CLINIC_OR_DEPARTMENT_OTHER)
Admission: RE | Admit: 2015-05-06 | Discharge: 2015-05-06 | Disposition: A | Discharge status: Home / Self Care | Payer: BLUE CROSS/BLUE SHIELD | Source: Ambulatory Visit | Attending: Internal Medicine | Admitting: Internal Medicine

## 2015-05-06 DIAGNOSIS — R1012 Left upper quadrant pain: Secondary | ICD-10-CM | POA: Diagnosis present

## 2015-05-06 MED ORDER — IOHEXOL 300 MG/ML  SOLN
100.0000 mL | Freq: Once | INTRAMUSCULAR | Status: AC | PRN
  Administered 2015-05-06: 100 mL via INTRAVENOUS

## 2015-05-11 ENCOUNTER — Ambulatory Visit (HOSPITAL_BASED_OUTPATIENT_CLINIC_OR_DEPARTMENT_OTHER)
Admission: RE | Admit: 2015-05-11 | Discharge: 2015-05-11 | Disposition: A | Discharge status: Home / Self Care | Payer: BLUE CROSS/BLUE SHIELD | Source: Ambulatory Visit | Attending: Family | Admitting: Family

## 2015-05-11 DIAGNOSIS — Z6841 Body Mass Index (BMI) 40.0 and over, adult: Secondary | ICD-10-CM | POA: Diagnosis not present

## 2015-05-11 DIAGNOSIS — R0602 Shortness of breath: Secondary | ICD-10-CM | POA: Diagnosis not present

## 2015-05-11 NOTE — Progress Notes (Signed)
Echocardiogram 2D Echocardiogram has been performed.  Dorothey Baseman 05/11/2015, 10:51 AM

## 2015-05-12 ENCOUNTER — Encounter: Payer: Self-pay | Admitting: Family

## 2015-05-18 ENCOUNTER — Ambulatory Visit (INDEPENDENT_AMBULATORY_CARE_PROVIDER_SITE_OTHER): Payer: BLUE CROSS/BLUE SHIELD | Admitting: Internal Medicine

## 2015-05-18 ENCOUNTER — Encounter: Payer: Self-pay | Admitting: Internal Medicine

## 2015-05-18 VITALS — BP 116/76 | HR 83 | Temp 97.9°F | Ht 63.0 in | Wt 270.1 lb

## 2015-05-18 DIAGNOSIS — J069 Acute upper respiratory infection, unspecified: Secondary | ICD-10-CM

## 2015-05-18 DIAGNOSIS — K5900 Constipation, unspecified: Secondary | ICD-10-CM

## 2015-05-18 MED ORDER — AZELASTINE HCL 0.1 % NA SOLN: 2.0000 | Freq: Every evening | NASAL | Status: DC | PRN

## 2015-05-18 NOTE — Progress Notes (Signed)
Pre visit review using our clinic review tool, if applicable. No additional management support is needed unless otherwise documented below in the visit note. 

## 2015-05-18 NOTE — Patient Instructions (Signed)
Rest, fluids , tylenol   take Mucinex DM twice a day as needed for cough   OTC Nasocort or Flonase : 2 nasal sprays on each side of the nose daily until you feel better Also use Astelin 2 sprays every night until better   Call if not gradually better over the next  10 days Call anytime if the symptoms are severe   For constipation: MiraLAX 17 g with fluids every day
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
## 2015-08-11 ENCOUNTER — Encounter: Payer: Self-pay | Admitting: Family

## 2015-09-01 ENCOUNTER — Ambulatory Visit (INDEPENDENT_AMBULATORY_CARE_PROVIDER_SITE_OTHER): Payer: BLUE CROSS/BLUE SHIELD | Admitting: Medical

## 2015-09-01 ENCOUNTER — Encounter: Payer: Self-pay | Admitting: Medical

## 2015-09-01 VITALS — BP 106/70 | HR 78 | Temp 97.8°F | Ht 63.0 in | Wt 267.2 lb

## 2015-09-01 DIAGNOSIS — H60392 Other infective otitis externa, left ear: Secondary | ICD-10-CM

## 2015-09-01 DIAGNOSIS — H6503 Acute serous otitis media, bilateral: Secondary | ICD-10-CM

## 2015-09-01 MED ORDER — AMOXICILLIN-POT CLAVULANATE 875-125 MG PO TABS: 1.0000 | ORAL_TABLET | Freq: Two times a day (BID) | ORAL | Status: DC

## 2015-09-01 MED ORDER — NEOMYCIN-POLYMYXIN-HC 1 % OT SOLN: 3.0000 [drp] | Freq: Four times a day (QID) | OTIC | Status: DC

## 2015-09-01 NOTE — Patient Instructions (Signed)
For bilateral OM. Rx augmentin.  For left OE. Rx cortisporin otic.  For pain ibuprofen otc.  If pain worsens/persists or changes notify us.  Follow up in 7-10 days or as needed

## 2015-09-01 NOTE — Progress Notes (Signed)
Pre visit review using our clinic review tool, if applicable. No additional management support is needed unless otherwise documented below in the visit note. 
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
## 2015-09-14 ENCOUNTER — Ambulatory Visit
Admission: RE | Admit: 2015-09-14 | Discharge: 2015-09-14 | Disposition: A | Discharge status: Home / Self Care | Payer: BLUE CROSS/BLUE SHIELD | Source: Ambulatory Visit

## 2015-09-14 DIAGNOSIS — Z1231 Encounter for screening mammogram for malignant neoplasm of breast: Secondary | ICD-10-CM

## 2015-10-19 ENCOUNTER — Ambulatory Visit: Payer: BLUE CROSS/BLUE SHIELD

## 2016-03-26 DIAGNOSIS — L821 Other seborrheic keratosis: Secondary | ICD-10-CM | POA: Diagnosis not present

## 2016-03-26 DIAGNOSIS — L308 Other specified dermatitis: Secondary | ICD-10-CM | POA: Diagnosis not present

## 2016-03-26 DIAGNOSIS — D045 Carcinoma in situ of skin of trunk: Secondary | ICD-10-CM | POA: Diagnosis not present

## 2016-04-06 DIAGNOSIS — L570 Actinic keratosis: Secondary | ICD-10-CM | POA: Diagnosis not present

## 2016-05-28 DIAGNOSIS — H6092 Unspecified otitis externa, left ear: Secondary | ICD-10-CM | POA: Diagnosis not present

## 2016-05-28 DIAGNOSIS — L299 Pruritus, unspecified: Secondary | ICD-10-CM | POA: Diagnosis not present

## 2016-06-12 DIAGNOSIS — R300 Dysuria: Secondary | ICD-10-CM | POA: Diagnosis not present

## 2016-07-02 ENCOUNTER — Telehealth: Payer: Self-pay | Admitting: Family

## 2016-07-02 NOTE — Telephone Encounter (Signed)
Ok to switch 

## 2016-07-02 NOTE — Telephone Encounter (Signed)
Ok with me 

## 2016-07-02 NOTE — Telephone Encounter (Signed)
Pt asking to trans care to Dr. Beverely Lowabori. Pt states that she has moved out to [place] area.

## 2016-07-03 DIAGNOSIS — Z1389 Encounter for screening for other disorder: Secondary | ICD-10-CM | POA: Diagnosis not present

## 2016-07-03 DIAGNOSIS — R5383 Other fatigue: Secondary | ICD-10-CM | POA: Diagnosis not present

## 2016-07-03 DIAGNOSIS — R0602 Shortness of breath: Secondary | ICD-10-CM | POA: Diagnosis not present

## 2016-07-03 DIAGNOSIS — Z711 Person with feared health complaint in whom no diagnosis is made: Secondary | ICD-10-CM | POA: Diagnosis not present

## 2016-07-03 DIAGNOSIS — Z0000 Encounter for general adult medical examination without abnormal findings: Secondary | ICD-10-CM | POA: Diagnosis not present

## 2016-07-03 DIAGNOSIS — E559 Vitamin D deficiency, unspecified: Secondary | ICD-10-CM | POA: Diagnosis not present

## 2016-07-03 DIAGNOSIS — Z114 Encounter for screening for human immunodeficiency virus [HIV]: Secondary | ICD-10-CM | POA: Diagnosis not present

## 2016-07-03 NOTE — Telephone Encounter (Signed)
Pt has been scheduled for KT 1st avail. In Dec and placed on wait list.

## 2016-09-03 ENCOUNTER — Other Ambulatory Visit: Payer: Self-pay | Admitting: Family Medicine

## 2016-10-09 ENCOUNTER — Ambulatory Visit (INDEPENDENT_AMBULATORY_CARE_PROVIDER_SITE_OTHER): Payer: BLUE CROSS/BLUE SHIELD | Admitting: Physician Assistant

## 2016-10-09 ENCOUNTER — Encounter: Payer: Self-pay | Admitting: Physician Assistant

## 2016-10-09 VITALS — BP 118/70 | HR 73 | Temp 98.0°F | Resp 16 | Ht 63.0 in | Wt 267.0 lb

## 2016-10-09 DIAGNOSIS — H6691 Otitis media, unspecified, right ear: Secondary | ICD-10-CM

## 2016-10-09 DIAGNOSIS — R252 Cramp and spasm: Secondary | ICD-10-CM | POA: Diagnosis not present

## 2016-10-09 LAB — BASIC METABOLIC PANEL
BUN: 15 mg/dL (ref 7–25)
CO2: 29 mmol/L (ref 20–31)
Calcium: 8.9 mg/dL (ref 8.6–10.4)
Chloride: 105 mmol/L (ref 98–110)
Creat: 0.51 mg/dL (ref 0.50–1.05)
Glucose, Bld: 88 mg/dL (ref 65–99)
Potassium: 4.1 mmol/L (ref 3.5–5.3)
Sodium: 141 mmol/L (ref 135–146)

## 2016-10-09 MED ORDER — FLUTICASONE PROPIONATE 50 MCG/ACT NA SUSP: 2.0000 | Freq: Every day | NASAL | 6 refills | Status: DC

## 2016-10-09 MED ORDER — AMOXICILLIN 500 MG PO CAPS: 500.0000 mg | ORAL_CAPSULE | Freq: Two times a day (BID) | ORAL | 0 refills | Status: DC

## 2016-10-09 NOTE — Progress Notes (Signed)
Pre visit review using our clinic review tool, if applicable. No additional management support is needed unless otherwise documented below in the visit note. 
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
## 2016-10-09 NOTE — Patient Instructions (Signed)
Please take antibiotic as directed with food.  Start the [employer]onase. Advil for ear pain. Let us know if symptoms are not improving.  Please go to the lab so we can check electrolytes.  Make sure to hydrate well and get a good amount of potassium in the diet to prevent cramping.   If labs look good I will refill the Lasix until you see Dr. Beverely Lowabori.

## 2016-10-10 ENCOUNTER — Other Ambulatory Visit: Payer: Self-pay | Admitting: Physician Assistant

## 2016-10-10 MED ORDER — FUROSEMIDE 20 MG PO TABS: 20.0000 mg | ORAL_TABLET | Freq: Every day | ORAL | 0 refills | Status: DC

## 2016-10-31 ENCOUNTER — Other Ambulatory Visit: Payer: Self-pay | Admitting: Family Medicine

## 2016-10-31 DIAGNOSIS — Z1231 Encounter for screening mammogram for malignant neoplasm of breast: Secondary | ICD-10-CM

## 2016-11-02 ENCOUNTER — Other Ambulatory Visit: Payer: Self-pay | Admitting: General Practice

## 2016-11-02 ENCOUNTER — Encounter: Payer: Self-pay | Admitting: Family Medicine

## 2016-11-02 ENCOUNTER — Ambulatory Visit (INDEPENDENT_AMBULATORY_CARE_PROVIDER_SITE_OTHER): Payer: BLUE CROSS/BLUE SHIELD | Admitting: Family Medicine

## 2016-11-02 DIAGNOSIS — Z0000 Encounter for general adult medical examination without abnormal findings: Secondary | ICD-10-CM

## 2016-11-02 DIAGNOSIS — Z23 Encounter for immunization: Secondary | ICD-10-CM | POA: Diagnosis not present

## 2016-11-02 LAB — CBC WITH DIFFERENTIAL/PLATELET
Basophils Absolute: 0 10*3/uL (ref 0.0–0.1)
Basophils Relative: 0.4 % (ref 0.0–3.0)
Eosinophils Absolute: 0.2 10*3/uL (ref 0.0–0.7)
Eosinophils Relative: 3.3 % (ref 0.0–5.0)
HCT: 38.7 % (ref 36.0–46.0)
Hemoglobin: 13.1 g/dL (ref 12.0–15.0)
Lymphocytes Relative: 30.6 % (ref 12.0–46.0)
Lymphs Abs: 1.7 10*3/uL (ref 0.7–4.0)
MCHC: 33.8 g/dL (ref 30.0–36.0)
MCV: 88.6 fl (ref 78.0–100.0)
Monocytes Absolute: 0.7 10*3/uL (ref 0.1–1.0)
Monocytes Relative: 11.6 % (ref 3.0–12.0)
Neutro Abs: 3 10*3/uL (ref 1.4–7.7)
Neutrophils Relative %: 54.1 % (ref 43.0–77.0)
Platelets: 198 10*3/uL (ref 150.0–400.0)
RBC: 4.37 Mil/uL (ref 3.87–5.11)
RDW: 13.6 % (ref 11.5–15.5)
WBC: 5.6 10*3/uL (ref 4.0–10.5)

## 2016-11-02 LAB — LIPID PANEL
Cholesterol: 222 mg/dL — ABNORMAL HIGH (ref 0–200)
HDL: 43.7 mg/dL (ref >39.00)
NonHDL: 178.58
Total CHOL/HDL Ratio: 5
Triglycerides: 342 mg/dL — ABNORMAL HIGH (ref 0.0–149.0)
VLDL: 68.4 mg/dL — ABNORMAL HIGH (ref 0.0–40.0)

## 2016-11-02 LAB — HEPATIC FUNCTION PANEL
ALT: 18 U/L (ref 0–35)
AST: 14 U/L (ref 0–37)
Albumin: 4 g/dL (ref 3.5–5.2)
Alkaline Phosphatase: 78 U/L (ref 39–117)
Bilirubin, Direct: 0 mg/dL (ref 0.0–0.3)
Total Bilirubin: 0.3 mg/dL (ref 0.2–1.2)
Total Protein: 6.8 g/dL (ref 6.0–8.3)

## 2016-11-02 LAB — TSH: TSH: 1.58 u[IU]/mL (ref 0.35–4.50)

## 2016-11-02 LAB — BASIC METABOLIC PANEL
BUN: 14 mg/dL (ref 6–23)
CO2: 30 mEq/L (ref 19–32)
Calcium: 9.4 mg/dL (ref 8.4–10.5)
Chloride: 103 mEq/L (ref 96–112)
Creatinine, Ser: 0.57 mg/dL (ref 0.40–1.20)
GFR: 118.19 mL/min (ref >60.00)
Glucose, Bld: 97 mg/dL (ref 70–99)
Potassium: 4 mEq/L (ref 3.5–5.1)
Sodium: 140 mEq/L (ref 135–145)

## 2016-11-02 LAB — LDL CHOLESTEROL, DIRECT: Direct LDL: 134 mg/dL

## 2016-11-02 LAB — VITAMIN D 25 HYDROXY (VIT D DEFICIENCY, FRACTURES): VITD: 17.87 ng/mL — ABNORMAL LOW (ref 30.00–100.00)

## 2016-11-02 MED ORDER — FUROSEMIDE 20 MG PO TABS: 20.0000 mg | ORAL_TABLET | Freq: Every day | ORAL | 6 refills | Status: DC

## 2016-11-02 NOTE — Progress Notes (Signed)
Pre visit review using our clinic review tool, if applicable. No additional management support is needed unless otherwise documented below in the visit note. 

## 2016-11-02 NOTE — Progress Notes (Signed)
   Subjective:    Patient ID: Mikayla FieldingSherry [name], female    DOB: Nov 07, 1966, 50 y.o.   MRN: [id number]  HPI New to establish.  Previous PCP- Peggyann Juba'Sullivan  CPE- UTD on colonoscopy (due 2022), mammo scheduled for Feb.  No need for pap due to hysterectomy.  Due for flu.  No concerns today.     Review of Systems Patient reports no vision/ hearing changes, adenopathy,fever, weight change,  persistant/recurrent hoarseness , swallowing issues, chest pain, palpitations, edema, persistant/recurrent cough, hemoptysis, dyspnea (rest/exertional/paroxysmal nocturnal), gastrointestinal bleeding (melena, rectal bleeding), abdominal pain, significant heartburn, bowel changes, GU symptoms (dysuria, hematuria, incontinence), Gyn symptoms (abnormal  bleeding, pain),  syncope, focal weakness, memory loss, numbness & tingling, skin/hair/nail changes, abnormal bruising or bleeding, anxiety, or depression.     Objective:   Physical Exam General Appearance:    Alert, cooperative, no distress, appears stated age, obese  Head:    Normocephalic, without obvious abnormality, atraumatic  Eyes:    PERRL, conjunctiva/corneas clear, EOM's intact, fundi    benign, both eyes  Ears:    Normal TM's and external ear canals, both ears  Nose:   Nares normal, septum midline, mucosa normal, no drainage    or sinus tenderness  Throat:   Lips, mucosa, and tongue normal; teeth and gums normal  Neck:   Supple, symmetrical, trachea midline, no adenopathy;    Thyroid: no enlargement/tenderness/nodules  Back:     Symmetric, no curvature, ROM normal, no CVA tenderness  Lungs:     Clear to auscultation bilaterally, respirations unlabored  Chest Wall:    No tenderness or deformity   Heart:    Regular rate and rhythm, S1 and S2 normal, no murmur, rub   or gallop  Breast Exam:    Deferred to mammo  Abdomen:     Soft, non-tender, bowel sounds active all four quadrants,    no masses, no organomegaly  Genitalia:    Deferred  Rectal:      Extremities:   Extremities normal, atraumatic, no cyanosis or edema  Pulses:   2+ and symmetric all extremities  Skin:   Skin color, texture, turgor normal, no rashes or lesions  Lymph nodes:   Cervical, supraclavicular, and axillary nodes normal  Neurologic:   CNII-XII intact, normal strength, sensation and reflexes    throughout          Assessment & Plan:

## 2016-11-02 NOTE — Assessment & Plan Note (Signed)
Pt's PE WNL w/ exception of obesity.  Mammo scheduled.  UTD on colonoscopy.  Flu shot given.  Check labs.  Anticipatory guidance provided.

## 2016-11-02 NOTE — Addendum Note (Signed)
Addended by: Sheliah HatchABORI, KATHERINE E on: 11/02/2016 02:13 PM   Modules accepted: Orders

## 2016-11-02 NOTE — Patient Instructions (Signed)
Follow up in 6 months to recheck weight loss progress- you can do it! We'll notify you of your lab results and make any changes if needed Continue to work on healthy diet and regular exercise- you've got this! Please have them send me a copy of your mammogram report Call with any questions or concerns Happy Holidays!!!

## 2016-11-05 ENCOUNTER — Other Ambulatory Visit: Payer: Self-pay | Admitting: General Practice

## 2016-11-05 MED ORDER — VITAMIN D (ERGOCALCIFEROL) 1.25 MG (50000 UNIT) PO CAPS: 50000.0000 [IU] | ORAL_CAPSULE | ORAL | 0 refills | Status: DC

## 2016-11-24 DIAGNOSIS — Z23 Encounter for immunization: Secondary | ICD-10-CM | POA: Diagnosis not present

## 2016-11-24 DIAGNOSIS — R001 Bradycardia, unspecified: Secondary | ICD-10-CM | POA: Diagnosis not present

## 2016-11-24 DIAGNOSIS — E639 Nutritional deficiency, unspecified: Secondary | ICD-10-CM | POA: Diagnosis not present

## 2016-11-24 DIAGNOSIS — D649 Anemia, unspecified: Secondary | ICD-10-CM | POA: Diagnosis not present

## 2016-11-24 DIAGNOSIS — I959 Hypotension, unspecified: Secondary | ICD-10-CM | POA: Diagnosis not present

## 2016-11-24 DIAGNOSIS — D696 Thrombocytopenia, unspecified: Secondary | ICD-10-CM | POA: Diagnosis not present

## 2016-11-25 DIAGNOSIS — I959 Hypotension, unspecified: Secondary | ICD-10-CM | POA: Diagnosis not present

## 2016-11-27 DIAGNOSIS — Z051 Observation and evaluation of newborn for suspected infectious condition ruled out: Secondary | ICD-10-CM | POA: Diagnosis not present

## 2016-11-27 DIAGNOSIS — Z412 Encounter for routine and ritual male circumcision: Secondary | ICD-10-CM | POA: Diagnosis not present

## 2017-01-14 DIAGNOSIS — H40053 Ocular hypertension, bilateral: Secondary | ICD-10-CM | POA: Diagnosis not present

## 2017-01-14 DIAGNOSIS — H1789 Other corneal scars and opacities: Secondary | ICD-10-CM | POA: Diagnosis not present

## 2017-01-14 DIAGNOSIS — H04123 Dry eye syndrome of bilateral lacrimal glands: Secondary | ICD-10-CM | POA: Diagnosis not present

## 2017-01-14 DIAGNOSIS — H524 Presbyopia: Secondary | ICD-10-CM | POA: Diagnosis not present

## 2017-01-18 ENCOUNTER — Ambulatory Visit: Payer: BLUE CROSS/BLUE SHIELD

## 2017-02-07 ENCOUNTER — Telehealth: Payer: Self-pay | Admitting: Family Medicine

## 2017-02-07 NOTE — Telephone Encounter (Signed)
Patient would like to switch PCP from Dr. Beverely Lowabori at [facility] to Dr. Earlene PlaterWallace at [facility].  Please respond at your earliest convenience to acknowledge the patients request.  Thank you,  -LL

## 2017-02-07 NOTE — Telephone Encounter (Signed)
Okay 

## 2017-02-08 NOTE — Telephone Encounter (Signed)
Ok

## 2017-02-18 ENCOUNTER — Telehealth: Payer: Self-pay | Admitting: *Deleted

## 2017-02-18 ENCOUNTER — Encounter: Payer: Self-pay | Admitting: *Deleted

## 2017-02-18 NOTE — Telephone Encounter (Signed)
PreVisit Call completed. Pt agreeable to order for mammogram being placed. Pt also would like to discuss how she can get started with loosing weight. No acute complaints.

## 2017-02-18 NOTE — Telephone Encounter (Signed)
PreVisit call attempted, pt asked for a return call. Confirmed appt.

## 2017-02-18 NOTE — Addendum Note (Signed)
Addended by: Neomia Dear on: 02/18/2017 03:17 PM   Modules accepted: Orders

## 2017-02-19 ENCOUNTER — Encounter: Payer: Self-pay | Admitting: Family Medicine

## 2017-02-19 ENCOUNTER — Ambulatory Visit (INDEPENDENT_AMBULATORY_CARE_PROVIDER_SITE_OTHER): Payer: BLUE CROSS/BLUE SHIELD | Admitting: Family Medicine

## 2017-02-19 VITALS — BP 116/72 | HR 75 | Temp 98.3°F | Ht 63.0 in | Wt 269.2 lb

## 2017-02-19 DIAGNOSIS — H66001 Acute suppurative otitis media without spontaneous rupture of ear drum, right ear: Secondary | ICD-10-CM | POA: Diagnosis not present

## 2017-02-19 DIAGNOSIS — Z1239 Encounter for other screening for malignant neoplasm of breast: Secondary | ICD-10-CM

## 2017-02-19 DIAGNOSIS — E785 Hyperlipidemia, unspecified: Secondary | ICD-10-CM

## 2017-02-19 DIAGNOSIS — Z1231 Encounter for screening mammogram for malignant neoplasm of breast: Secondary | ICD-10-CM

## 2017-02-19 DIAGNOSIS — E8881 Metabolic syndrome: Secondary | ICD-10-CM

## 2017-02-19 DIAGNOSIS — E88819 Insulin resistance, unspecified: Secondary | ICD-10-CM | POA: Insufficient documentation

## 2017-02-19 LAB — COMPREHENSIVE METABOLIC PANEL
ALT: 20 U/L (ref 0–35)
AST: 20 U/L (ref 0–37)
Albumin: 4.1 g/dL (ref 3.5–5.2)
Alkaline Phosphatase: 68 U/L (ref 39–117)
BUN: 12 mg/dL (ref 6–23)
CO2: 29 mEq/L (ref 19–32)
Calcium: 9.8 mg/dL (ref 8.4–10.5)
Chloride: 103 mEq/L (ref 96–112)
Creatinine, Ser: 0.57 mg/dL (ref 0.40–1.20)
GFR: 118.05 mL/min (ref >60.00)
Glucose, Bld: 90 mg/dL (ref 70–99)
Potassium: 4.4 mEq/L (ref 3.5–5.1)
Sodium: 139 mEq/L (ref 135–145)
Total Bilirubin: 0.1 mg/dL — ABNORMAL LOW (ref 0.2–1.2)
Total Protein: 7.6 g/dL (ref 6.0–8.3)

## 2017-02-19 LAB — HEMOGLOBIN A1C: Hgb A1c MFr Bld: 6.1 % (ref 4.6–6.5)

## 2017-02-19 MED ORDER — PHENTERMINE HCL 37.5 MG PO TABS: 37.5000 mg | ORAL_TABLET | Freq: Every day | ORAL | 0 refills | Status: DC

## 2017-02-19 MED ORDER — METFORMIN HCL 500 MG PO TABS: 500.0000 mg | ORAL_TABLET | Freq: Two times a day (BID) | ORAL | 3 refills | Status: DC

## 2017-02-19 MED ORDER — AMOXICILLIN 875 MG PO TABS
875.0000 mg | ORAL_TABLET | Freq: Two times a day (BID) | ORAL | 0 refills | Status: DC
Start: 2017-02-19 — End: 2017-03-12
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
## 2017-02-19 NOTE — Progress Notes (Signed)
Pre visit review using our clinic review tool, if applicable. No additional management support is needed unless otherwise documented below in the visit note. 

## 2017-02-22 ENCOUNTER — Telehealth: Payer: Self-pay | Admitting: Family Medicine

## 2017-02-22 NOTE — Telephone Encounter (Signed)
Patient called to check on lab results status. Please call patient once results are ready to be disclosed to patient. Okay to leave a detailed message on patient's phone.

## 2017-02-22 NOTE — Telephone Encounter (Signed)
Please advise on patients lab results.

## 2017-02-24 NOTE — Telephone Encounter (Signed)
Results consistent with prediabetes still. The Metformin will be a helpful start. Apologize to her - I think that I double signed labs without sending a result to her.

## 2017-02-25 NOTE — Telephone Encounter (Signed)
Notified patient of lab results. Patient verbalized understanding and has already started metformin.

## 2017-03-12 ENCOUNTER — Encounter: Payer: Self-pay | Admitting: Family Medicine

## 2017-03-12 ENCOUNTER — Ambulatory Visit
Admission: RE | Admit: 2017-03-12 | Discharge: 2017-03-12 | Disposition: A | Discharge status: Home / Self Care | Payer: BLUE CROSS/BLUE SHIELD | Source: Ambulatory Visit | Attending: Family Medicine | Admitting: Family Medicine

## 2017-03-12 ENCOUNTER — Ambulatory Visit (INDEPENDENT_AMBULATORY_CARE_PROVIDER_SITE_OTHER): Payer: BLUE CROSS/BLUE SHIELD | Admitting: Family Medicine

## 2017-03-12 VITALS — BP 126/78 | HR 85 | Temp 98.1°F | Ht 63.0 in | Wt 265.4 lb

## 2017-03-12 DIAGNOSIS — R300 Dysuria: Secondary | ICD-10-CM

## 2017-03-12 DIAGNOSIS — Z1231 Encounter for screening mammogram for malignant neoplasm of breast: Secondary | ICD-10-CM | POA: Diagnosis not present

## 2017-03-12 DIAGNOSIS — Z1239 Encounter for other screening for malignant neoplasm of breast: Secondary | ICD-10-CM

## 2017-03-12 LAB — POC URINALSYSI DIPSTICK (AUTOMATED)
Bilirubin, UA: NEGATIVE
Blood, UA: NEGATIVE
Glucose, UA: NEGATIVE
Ketones, UA: NEGATIVE
Nitrite, UA: NEGATIVE
Protein, UA: 15
Spec Grav, UA: >=1.03 — AB (ref 1.010–1.025)
Urobilinogen, UA: 0.2 E.U./dL
pH, UA: 6 (ref 5.0–8.0)

## 2017-03-12 MED ORDER — CIPROFLOXACIN HCL 250 MG PO TABS: 250.0000 mg | ORAL_TABLET | Freq: Two times a day (BID) | ORAL | 0 refills | Status: DC

## 2017-03-12 MED ORDER — PHENAZOPYRIDINE HCL 100 MG PO TABS: 100.0000 mg | ORAL_TABLET | Freq: Three times a day (TID) | ORAL | 0 refills | Status: DC | PRN

## 2017-03-12 NOTE — Progress Notes (Signed)
Pre visit review using our clinic review tool, if applicable. No additional management support is needed unless otherwise documented below in the visit note. 

## 2017-03-12 NOTE — Progress Notes (Signed)
Mikayla [name] is a 51 y.o. female here for a new problem.  History of Present Illness:   Insurance claims handler, CMA, acting as scribe for Dr. Earlene Plater.  Chief Complaint  Patient presents with  . Acute Visit  . Dysuria   Dysuria   This is a new problem. The current episode started in the past 7 days. The problem occurs every urination. The problem has been gradually worsening. The quality of the pain is described as burning. The pain is mild. There has been no fever. She is sexually active. There is no history of pyelonephritis. Pertinent negatives include no chills, flank pain or hematuria. She has tried nothing for the symptoms.   PMHx, SurgHx, SocialHx, Medications, and Allergies were reviewed in the Visit Navigator and updated as appropriate.  Current Medications:   .  furosemide (LASIX) 20 MG tablet, Take 1 tablet (20 mg total) by mouth daily. (Patient taking differently: Take 20 mg by mouth as needed. ), Disp: 30 tablet, Rfl: 6 .  LORazepam (ATIVAN) 0.5 MG tablet, Take 1 tablet (0.5 mg total) by mouth at bedtime as needed for anxiety (qhs prn)., Disp: 30 tablet, Rfl: 0 .  metFORMIN (GLUCOPHAGE) 500 MG tablet, Take 1 tablet (500 mg total) by mouth 2 (two) times daily with a meal., Disp: 180 tablet, Rfl: 3 .  Multiple Vitamin (MULTIVITAMIN) tablet, Take 1 tablet by mouth daily., Disp: , Rfl:  .  phentermine (ADIPEX-P) 37.5 MG tablet, Take 1 tablet (37.5 mg total) by mouth daily before breakfast., Disp: 30 tablet, Rfl: 0   Review of Systems:   Review of Systems  Constitutional: Negative for chills and fever.  HENT: Negative for congestion, ear pain and sore throat.   Eyes: Negative for blurred vision.  Respiratory: Negative for cough and shortness of breath.   Cardiovascular: Negative for chest pain and palpitations.  Gastrointestinal: Negative for abdominal pain.  Genitourinary: Positive for dysuria. Negative for flank pain and hematuria.  Skin: Negative for rash.   Vitals:   Vitals:   03/12/17 1101  BP: 126/78  Pulse: 85  Temp: 98.1 F (36.7 C)  TempSrc: Oral  SpO2: 96%  Weight: 265 lb 6.4 oz (120.4 kg)  Height:  (1.6 m)     Body mass index is 47.01 kg/m.  Physical Exam:   Physical Exam  Constitutional: She appears well-nourished.  HENT:  Head: Normocephalic and atraumatic.  Eyes: EOM are normal. Pupils are equal, round, and reactive to light.  Neck: Normal range of motion. Neck supple.  Cardiovascular: Normal rate, regular rhythm, normal heart sounds and intact distal pulses.   Pulmonary/Chest: Effort normal.  Abdominal: Soft.  Skin: Skin is warm.  Psychiatric: She has a normal mood and affect. Her behavior is normal.  Nursing note and vitals reviewed.  Results for orders placed or performed in visit on 03/12/17  POCT Urinalysis Dipstick (Automated)  Result Value Ref Range   Color, UA Yellow    Clarity, UA Cloudy    Glucose, UA Negative    Bilirubin, UA Negative    Ketones, UA Negative    Spec Grav, UA >=1.030 (A) 1.010 - 1.025   Blood, UA Negative    pH, UA 6.0 5.0 - 8.0   Protein, UA 15 mg/dL    Urobilinogen, UA 0.2 0.2 or 1.0 E.U./dL   Nitrite, UA Negative    Leukocytes, UA Moderate (2+) (A) Negative    Assessment and Plan:    Mikayla [name] was seen today for acute visit and dysuria.  Diagnoses and all orders for this visit:  Burning with urination -     POCT Urinalysis Dipstick (Automated) -     Urine culture -     phenazopyridine (PYRIDIUM) 100 MG tablet; Take 1 tablet (100 mg total) by mouth 3 (three) times daily as needed for pain. -     ciprofloxacin (CIPRO) 250 MG tablet; Take 1 tablet (250 mg total) by mouth 2 (two) times daily.  . Reviewed expectations re: course of current medical issues. . Discussed self-management of symptoms. . Outlined signs and symptoms indicating need for more acute intervention. . Patient verbalized understanding and all questions were answered. . See orders for this visit as documented in the  electronic medical record. . Patient received an After-Visit Summary.  CMA served as Neurosurgeon during this visit. History, Physical, and Plan performed by medical provider. Documentation and orders reviewed and attested to. Helane Rima, D.O.  Helane Rima, D.O. [facility] [employer], [facility]

## 2017-03-14 LAB — URINE CULTURE

## 2017-03-21 ENCOUNTER — Ambulatory Visit: Payer: BLUE CROSS/BLUE SHIELD | Admitting: Family Medicine

## 2017-03-21 ENCOUNTER — Encounter: Payer: Self-pay | Admitting: Family Medicine

## 2017-03-21 ENCOUNTER — Ambulatory Visit (INDEPENDENT_AMBULATORY_CARE_PROVIDER_SITE_OTHER): Payer: BLUE CROSS/BLUE SHIELD | Admitting: Family Medicine

## 2017-03-21 DIAGNOSIS — R300 Dysuria: Secondary | ICD-10-CM

## 2017-03-21 LAB — POC URINALSYSI DIPSTICK (AUTOMATED)
Bilirubin, UA: NEGATIVE
Blood, UA: NEGATIVE
Glucose, UA: NEGATIVE
Ketones, UA: NEGATIVE
Nitrite, UA: NEGATIVE
Protein, UA: 15
Spec Grav, UA: >=1.03 — AB (ref 1.010–1.025)
Urobilinogen, UA: 1 E.U./dL
pH, UA: 6 (ref 5.0–8.0)

## 2017-03-21 MED ORDER — LIRAGLUTIDE 18 MG/3ML ~~LOC~~ SOPN: PEN_INJECTOR | SUBCUTANEOUS | 1 refills | Status: DC

## 2017-03-21 NOTE — Progress Notes (Signed)
Mikayla [name] is a 51 y.o. female is here for follow up. Like  History of Present Illness:   Britt Bottom CMA acting as scribe for Dr. Earlene Plater.  CC: Patient is coming in today for a weight check. She has been taking Phentermine for one month. She has lost a total of 4 pounds since starting on medication. She would like to discuss trying something else for weight loss.   HPI:  1. Morbid obesity (HCC).She is following her eating plan approximately 50 % of the time and states she is exercising 3 days per week. She is currently struggling with lack of energy and hunger.    2. Dysuria. Patient was treated last week for UTI. She still complains of slight dysuria.    Patient Active Problem List   Diagnosis Date Noted  . Insulin resistance 02/19/2017  . Hyperlipidemia   . Constipation 04/12/2015  . 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 Vitamin D deficiency 08/13/2012  . Depression 11/27/2011  . Morbid obesity (HCC) 04/11/2010  . GERD 02/10/2009   Social History  Substance Use Topics  . Smoking status: Never Smoker  . Smokeless tobacco: Never Used  . Alcohol use No   Current Medications and Allergies:   .  furosemide (LASIX) 20 MG tablet, Take 1 tablet (20 mg total) by mouth daily. (Patient taking differently: Take 20 mg by mouth as needed. ), Disp: 30 tablet, Rfl: 6 .  LORazepam (ATIVAN) 0.5 MG tablet, Take 1 tablet (0.5 mg total) by mouth at bedtime as needed for anxiety (qhs prn)., Disp: 30 tablet, Rfl: 0 .  metFORMIN (GLUCOPHAGE) 500 MG tablet, Take 1 tablet (500 mg total) by mouth 2 (two) times daily with a meal., Disp: 180 tablet, Rfl: 3 .  Multiple Vitamin (MULTIVITAMIN) tablet, Take 1 tablet by mouth daily., Disp: , Rfl:  .  phenazopyridine (PYRIDIUM) 100 MG tablet, Take 1 tablet (100 mg total) by mouth 3 (three) times daily as needed for pain., Disp: 10 tablet, Rfl: 0 .  phentermine (ADIPEX-P) 37.5 MG tablet, Take 1 tablet (37.5 mg total) by  mouth daily before breakfast., Disp: 30 tablet, Rfl: 0  Allergies   Allergen Reactions  . Codeine Itching   Review of Systems   Review of Systems  Constitutional: Negative for chills, fever and malaise/fatigue.  HENT: Negative for congestion, ear pain, sinus pain and sore throat.   Eyes: Negative for blurred vision and double vision.  Respiratory: Negative for cough, shortness of breath and wheezing.   Cardiovascular: Negative for chest pain and palpitations.  Gastrointestinal: Negative for abdominal pain, nausea and vomiting.  Genitourinary: Negative for dysuria.  Musculoskeletal: Negative for back pain, joint pain and neck pain.  Neurological: Negative for dizziness and headaches.  Psychiatric/Behavioral: Negative for depression, hallucinations and memory loss.   Vitals:   Vitals:   03/21/17 1122  BP: 110/74  Pulse: 95  Temp: 98.3 F (36.8 C)  TempSrc: Oral  SpO2: 96%  Weight: 265 lb 3.2 oz (120.3 kg)  Height: 5\' 3"  (1.6 m)     Body mass index is 46.98 kg/m.   Physical Exam:    Physical Exam  Constitutional: She appears well-nourished.  HENT:  Head: Normocephalic and atraumatic.  Eyes: EOM are normal. Pupils are equal, round, and reactive to light.  Neck: Normal range of motion. Neck supple.  Cardiovascular: Normal rate, regular rhythm, normal heart sounds and intact distal pulses.   Pulmonary/Chest: Effort normal.  Abdominal: Soft.  Skin: Skin  is warm.  Psychiatric: She has a normal mood and affect. Her behavior is normal.  Nursing note and vitals reviewed.  Results for orders placed or performed in visit on 03/21/17  POCT Urinalysis Dipstick (Automated)  Result Value Ref Range   Color, UA Yellow    Clarity, UA Clear    Glucose, UA Negative    Bilirubin, UA Negative    Ketones, UA Negative    Spec Grav, UA >=1.030 (A) 1.010 - 1.025   Blood, UA Negative    pH, UA 6.0 5.0 - 8.0   Protein, UA 15    Urobilinogen, UA 1.0 0.2 or 1.0 E.U./dL   Nitrite, UA  Negative    Leukocytes, UA Small (1+) (A) Negative    Assessment and Plan:    Mikayla [name] was seen today for weight check.  Diagnoses and all orders for this visit:  Morbid obesity (HCC) Comments: We discussed medication management. Will wean from phentermine. Will trial Victoza. May increase metformin to twice daily. We discussed the importance of healthy food choices and regular exercise. We also discussed the importance of hydration. Orders: -     liraglutide 18 MG/3ML SOPN; 0.6 mg [place] qd X 1 week, then 1.2 mg [place] qd Max 1.8 mg a day.  Re titrate from 0.6 mg [place] qd if treatment interrupted for more than 3 days.  Dysuria Comments: Urine culture pending. Orders: -     POCT Urinalysis Dipstick (Automated) -     Urine culture    . Reviewed expectations re: course of current medical issues. . Discussed self-management of symptoms. . Outlined signs and symptoms indicating need for more acute intervention. . Patient verbalized understanding and all questions were answered. . See orders for this visit as documented in the electronic medical record. . Patient received an After Visit Summary.   CMA served as Neurosurgeonscribe during this visit. History, Physical, and Plan performed by medical provider. Documentation and orders reviewed and attested to. Mikayla [name], D.O.  Mikayla [name], D.O. , [facility] 03/21/2017

## 2017-03-22 ENCOUNTER — Other Ambulatory Visit: Payer: Self-pay

## 2017-03-22 ENCOUNTER — Telehealth: Payer: Self-pay | Admitting: Family Medicine

## 2017-03-22 MED ORDER — INSULIN PEN NEEDLE 31G X 8 MM MISC: 1.0000 | Freq: Every day | 11 refills | Status: DC

## 2017-03-22 MED ORDER — INSULIN PEN NEEDLE 31G X 6 MM MISC: 1.0000 | Freq: Every day | 11 refills | Status: DC

## 2017-03-22 NOTE — Telephone Encounter (Signed)
Todd from [facility] called saying patient needs RX for pen needles. Call pharmacy to advise.

## 2017-03-22 NOTE — Telephone Encounter (Signed)
Rx sent to pharmacy   

## 2017-03-23 LAB — URINE CULTURE

## 2017-04-12 ENCOUNTER — Telehealth: Payer: Self-pay | Admitting: Family Medicine

## 2017-04-12 NOTE — Telephone Encounter (Signed)
Please advise 

## 2017-04-12 NOTE — Telephone Encounter (Signed)
Stop the medication. Weight gain is likely from stopping the phentermine. We will call and check on her on Monday to make sure that her side effects have resolved (please make sure to do this and let me know).

## 2017-04-12 NOTE — Telephone Encounter (Signed)
Spoke with patient and she will stop the medication. We will be closed on Monday so we will contact the patient Tuesday to check on her. Patient verbalized understanding.

## 2017-04-12 NOTE — Telephone Encounter (Signed)
Patient is requesting advice. States that she is actually gaining weight with using victoza. Also states that her breast is tender.  Verified that cell # is best

## 2017-04-18 ENCOUNTER — Other Ambulatory Visit: Payer: Self-pay | Admitting: Family Medicine

## 2017-04-26 ENCOUNTER — Telehealth: Payer: Self-pay | Admitting: Family Medicine

## 2017-04-26 MED ORDER — FUROSEMIDE 20 MG PO TABS: 20.0000 mg | ORAL_TABLET | ORAL | 1 refills | Status: DC | PRN

## 2017-04-26 NOTE — Telephone Encounter (Signed)
Pt notified Rx sent to pharmacy that she requested.

## 2017-04-26 NOTE — Telephone Encounter (Signed)
Patient would like fluid pills sent in to:  [phone] [id number] [address] - [address] [phone]) [phone])   Patient does not know the name of the pills. Please call patient with any questions.

## 2017-04-26 NOTE — Telephone Encounter (Signed)
RX sent to pharmacy  

## 2017-04-29 ENCOUNTER — Ambulatory Visit: Payer: BLUE CROSS/BLUE SHIELD | Admitting: Family Medicine

## 2017-05-14 DIAGNOSIS — H01004 Unspecified blepharitis left upper eyelid: Secondary | ICD-10-CM | POA: Diagnosis not present

## 2017-05-14 DIAGNOSIS — H04123 Dry eye syndrome of bilateral lacrimal glands: Secondary | ICD-10-CM | POA: Diagnosis not present

## 2017-05-14 DIAGNOSIS — H01001 Unspecified blepharitis right upper eyelid: Secondary | ICD-10-CM | POA: Diagnosis not present

## 2017-06-05 DIAGNOSIS — H40053 Ocular hypertension, bilateral: Secondary | ICD-10-CM | POA: Diagnosis not present

## 2017-06-05 DIAGNOSIS — E119 Type 2 diabetes mellitus without complications: Secondary | ICD-10-CM | POA: Diagnosis not present

## 2017-06-05 DIAGNOSIS — H16223 Keratoconjunctivitis sicca, not specified as Sjogren's, bilateral: Secondary | ICD-10-CM | POA: Diagnosis not present

## 2017-06-05 DIAGNOSIS — H01001 Unspecified blepharitis right upper eyelid: Secondary | ICD-10-CM | POA: Diagnosis not present

## 2017-06-06 ENCOUNTER — Encounter: Payer: Self-pay | Admitting: Medical

## 2017-06-06 ENCOUNTER — Telehealth: Payer: Self-pay | Admitting: Family Medicine

## 2017-06-06 ENCOUNTER — Ambulatory Visit (INDEPENDENT_AMBULATORY_CARE_PROVIDER_SITE_OTHER): Payer: BLUE CROSS/BLUE SHIELD | Admitting: Medical

## 2017-06-06 VITALS — BP 130/75 | HR 82 | Temp 98.5°F | Resp 16 | Ht 63.0 in | Wt 269.0 lb

## 2017-06-06 DIAGNOSIS — L309 Dermatitis, unspecified: Secondary | ICD-10-CM

## 2017-06-06 MED ORDER — BETAMETHASONE DIPROPIONATE 0.05 % EX OINT: TOPICAL_OINTMENT | Freq: Two times a day (BID) | CUTANEOUS | 0 refills | Status: DC

## 2017-06-06 MED ORDER — METHYLPREDNISOLONE ACETATE 40 MG/ML IJ SUSP
40.0000 mg | Freq: Once | INTRAMUSCULAR | Status: AC
  Administered 2017-06-06: 40 mg via INTRAMUSCULAR

## 2017-06-06 NOTE — Addendum Note (Signed)
Addended by: Orlene OchRENCE, JASMINE N on: 06/06/2017 02:27 PM   Modules accepted: Orders

## 2017-06-06 NOTE — Telephone Encounter (Signed)
Noted  

## 2017-06-06 NOTE — Patient Instructions (Addendum)
For your hand eczema flare we gave DepoMedrol 40 mg im injection and diprolene ointment twice daily.   Also moisturize twice daily with Cetaphil.  If your rash does not clear or recent digit swelling does not improve then you may benefit from labs and referral to specialist.(dermatologist and possible rheumatologist)  Follow up 7-10 days with pcp or as needed

## 2017-06-06 NOTE — Telephone Encounter (Signed)
Patient is scheduled to see Jarold MottoSamantha Worley, PA tomorrow at 10:30am for an acute visit for ear aches and hands cracking.
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
## 2017-06-07 ENCOUNTER — Ambulatory Visit: Payer: BLUE CROSS/BLUE SHIELD | Admitting: Physician Assistant

## 2017-06-27 DIAGNOSIS — L308 Other specified dermatitis: Secondary | ICD-10-CM | POA: Diagnosis not present

## 2017-06-27 DIAGNOSIS — L821 Other seborrheic keratosis: Secondary | ICD-10-CM | POA: Diagnosis not present

## 2017-07-01 ENCOUNTER — Encounter: Payer: Self-pay | Admitting: Family Medicine

## 2017-07-01 ENCOUNTER — Ambulatory Visit (INDEPENDENT_AMBULATORY_CARE_PROVIDER_SITE_OTHER): Payer: BLUE CROSS/BLUE SHIELD | Admitting: Family Medicine

## 2017-07-01 VITALS — BP 128/78 | HR 87 | Temp 98.2°F | Ht 63.0 in | Wt 262.0 lb

## 2017-07-01 DIAGNOSIS — K5900 Constipation, unspecified: Secondary | ICD-10-CM

## 2017-07-01 DIAGNOSIS — R300 Dysuria: Secondary | ICD-10-CM | POA: Diagnosis not present

## 2017-07-01 LAB — POCT URINALYSIS DIPSTICK
Bilirubin, UA: NEGATIVE
Blood, UA: NEGATIVE
Glucose, UA: NEGATIVE
Ketones, UA: NEGATIVE
Nitrite, UA: NEGATIVE
Protein, UA: 15
Spec Grav, UA: >=1.03 — AB (ref 1.010–1.025)
Urobilinogen, UA: 1 E.U./dL
pH, UA: 6 (ref 5.0–8.0)

## 2017-07-01 MED ORDER — CIPROFLOXACIN HCL 250 MG PO TABS: 250.0000 mg | ORAL_TABLET | Freq: Two times a day (BID) | ORAL | 0 refills | Status: DC

## 2017-07-01 MED ORDER — PHENTERMINE HCL 37.5 MG PO TABS: 37.5000 mg | ORAL_TABLET | Freq: Every day | ORAL | 0 refills | Status: DC

## 2017-07-01 MED ORDER — PHENAZOPYRIDINE HCL 100 MG PO TABS: 100.0000 mg | ORAL_TABLET | Freq: Three times a day (TID) | ORAL | 0 refills | Status: DC | PRN

## 2017-07-01 NOTE — Progress Notes (Signed)
Mikayla [name] is a 51 y.o. female here for an acute visit.  History of Present Illness:   Insurance claims handler, CMA, acting as scribe for Dr. Earlene Plater.  HPI:    1. Dysuria. Patient began having burning with urination 5 days ago.  Started as irritation and has progressed.  Having urgency as well.  Has taken Pyridium for 3 days. No fever.    2. Constipation, unspecified constipation type. BM q 3 days. Has not tried anything. No pain.    3. Morbid obesity (HCC). Restarted Phentermine and wants a new Rx.     PMHx, SurgHx, SocialHx, Medications, and Allergies were reviewed in the Visit Navigator and updated as appropriate.  Current Medications:   .  betamethasone dipropionate (DIPROLENE) 0.05 % ointment, Apply topically 2 (two) times daily., Disp: 30 g, Rfl: 0 .  furosemide (LASIX) 20 MG tablet, Take 1 tablet (20 mg total) by mouth as needed., Disp: 30 tablet, Rfl: 1 .  Insulin Pen Needle 31G X 8 MM MISC, 1 applicator by Does not apply route daily., Disp: 30 each, Rfl: 11 .  LORazepam (ATIVAN) 0.5 MG tablet, Take 1 tablet (0.5 mg total) by mouth at bedtime as needed for anxiety (qhs prn)., Disp: 30 tablet, Rfl: 0 .  metFORMIN (GLUCOPHAGE) 500 MG tablet, Take 1 tablet (500 mg total) by mouth 2 (two) times daily with a meal., Disp: 180 tablet, Rfl: 3 .  Multiple Vitamin (MULTIVITAMIN) tablet, Take 1 tablet by mouth daily., Disp: , Rfl:  .  phenazopyridine (PYRIDIUM) 100 MG tablet, Take 1 tablet (100 mg total) by mouth 3 (three) times daily as needed for pain., Disp: 10 tablet, Rfl: 0 .  phentermine (ADIPEX-P) 37.5 MG tablet, Take 1 tablet (37.5 mg total) by mouth daily before breakfast., Disp: 30 tablet, Rfl: 0   Allergies  Allergen Reactions  . Codeine Itching   Review of Systems:   Pertinent items are noted in the HPI. Otherwise, ROS is negative.  Vitals:   Vitals:   07/01/17 1042  BP: 128/78  Pulse: 87  Temp: 98.2 F (36.8 C)  TempSrc: Oral  SpO2: 97%  Weight: 262 lb (118.8 kg)    Height: 5\' 3"  (1.6 m)     Body mass index is 46.41 kg/m. Physical Exam:   Physical Exam  Constitutional: Mikayla [name] appears well-nourished.  HENT:  Head: Normocephalic and atraumatic.  Eyes: Pupils are equal, round, and reactive to light. EOM are normal.  Neck: Normal range of motion. Neck supple.  Cardiovascular: Normal rate, regular rhythm, normal heart sounds and intact distal pulses.   Pulmonary/Chest: Effort normal.  Abdominal: Soft.  Skin: Skin is warm.  Psychiatric: Mikayla [name] has a normal mood and affect. Her behavior is normal.  Nursing note and vitals reviewed.  Results for orders placed or performed in visit on 07/01/17  POCT urinalysis dipstick  Result Value Ref Range   Color, UA Amber    Clarity, UA Cloudy    Glucose, UA Negative    Bilirubin, UA Negative    Ketones, UA Negative    Spec Grav, UA >=1.030 (A) 1.010 - 1.025   Blood, UA Negative    pH, UA 6.0 5.0 - 8.0   Protein, UA 15 mg/dL    Urobilinogen, UA 1.0 0.2 or 1.0 E.U./dL   Nitrite, UA Negative    Leukocytes, UA Moderate (2+) (A) Negative   Assessment and Plan:   Mikayla [name] was seen today for acute visit and urinary tract infection.  Diagnoses and all orders for  this visit:  Dysuria Comments: Likely UTI based on presentation. Will go ahead and treat. UCx pending.  Orders: -     POCT urinalysis dipstick -     Urine Culture -     ciprofloxacin (CIPRO) 250 MG tablet; Take 1 tablet (250 mg total) by mouth 2 (two) times daily. -     phenazopyridine (PYRIDIUM) 100 MG tablet; Take 1 tablet (100 mg total) by mouth 3 (three) times daily as needed for pain.  Constipation, unspecified constipation type Comments: See AVS for instructions.   Morbid obesity (HCC) Comments: The patient is asked to make an attempt to improve diet and exercise patterns to aid in medical management of this problem.  Orders: -     phentermine (ADIPEX-P) 37.5 MG tablet; Take 1 tablet (37.5 mg total) by mouth daily before  breakfast.   . Reviewed expectations re: course of current medical issues. . Discussed self-management of symptoms. . Outlined signs and symptoms indicating need for more acute intervention. . Patient verbalized understanding and all questions were answered. Marland Kitchen. Health Maintenance issues including appropriate healthy diet, exercise, and smoking avoidance were discussed with patient. . See orders for this visit as documented in the electronic medical record. . Patient received an After Visit Summary.  CMA served as Neurosurgeonscribe during this visit. History, Physical, and Plan performed by medical provider. The above documentation has been reviewed and is accurate and complete. Helane RimaErica Domonique Cothran, D.O.  Helane RimaErica Lindia Garms, DO [place] [place] [facility] 07/01/2017

## 2017-07-01 NOTE — Patient Instructions (Signed)
MANAGEMENT OF CHRONIC CONSTIPATION   Push fluids, all day, everyday  Eat lots of high fiber foods-fruits, veggies, bran and whole grain instead of white bread  Be active everyday. Inactivity makes constipation worse.  Add psyllium daily (Metamucil) which comes in capsules now. Start very low dose and work up to recommended dose on bottle daily.  Stay away from Milk of Magnesia or any magnesium containing laxative, unless you need it to clear things out rarely. It is an addictive laxative and your gut will become dependent on it.  If that is not working, I would start Miralax, which you can buy in generic 17 gms daily. It's a powder and not an "addictive laxative". Take it every day and titrate the dose up or down to get the daily BM.  

## 2017-07-04 LAB — URINE CULTURE

## 2017-07-05 ENCOUNTER — Other Ambulatory Visit: Payer: Self-pay

## 2017-07-05 ENCOUNTER — Other Ambulatory Visit (INDEPENDENT_AMBULATORY_CARE_PROVIDER_SITE_OTHER): Payer: BLUE CROSS/BLUE SHIELD

## 2017-07-05 DIAGNOSIS — R7303 Prediabetes: Secondary | ICD-10-CM | POA: Diagnosis not present

## 2017-07-05 LAB — CBC WITH DIFFERENTIAL/PLATELET
Basophils Absolute: 0 10*3/uL (ref 0.0–0.1)
Basophils Relative: 0.3 % (ref 0.0–3.0)
Eosinophils Absolute: 0.2 10*3/uL (ref 0.0–0.7)
Eosinophils Relative: 2.8 % (ref 0.0–5.0)
HCT: 40.5 % (ref 36.0–46.0)
Hemoglobin: 13.3 g/dL (ref 12.0–15.0)
Lymphocytes Relative: 32.8 % (ref 12.0–46.0)
Lymphs Abs: 2 10*3/uL (ref 0.7–4.0)
MCHC: 32.9 g/dL (ref 30.0–36.0)
MCV: 91 fl (ref 78.0–100.0)
Monocytes Absolute: 0.5 10*3/uL (ref 0.1–1.0)
Monocytes Relative: 7.6 % (ref 3.0–12.0)
Neutro Abs: 3.4 10*3/uL (ref 1.4–7.7)
Neutrophils Relative %: 56.5 % (ref 43.0–77.0)
Platelets: 194 10*3/uL (ref 150.0–400.0)
RBC: 4.45 Mil/uL (ref 3.87–5.11)
RDW: 13.1 % (ref 11.5–15.5)
WBC: 6 10*3/uL (ref 4.0–10.5)

## 2017-07-05 LAB — COMPREHENSIVE METABOLIC PANEL
ALT: 15 U/L (ref 0–35)
AST: 13 U/L (ref 0–37)
Albumin: 3.8 g/dL (ref 3.5–5.2)
Alkaline Phosphatase: 70 U/L (ref 39–117)
BUN: 15 mg/dL (ref 6–23)
CO2: 28 mEq/L (ref 19–32)
Calcium: 9.2 mg/dL (ref 8.4–10.5)
Chloride: 106 mEq/L (ref 96–112)
Creatinine, Ser: 0.62 mg/dL (ref 0.40–1.20)
GFR: 106.98 mL/min (ref >60.00)
Glucose, Bld: 93 mg/dL (ref 70–99)
Potassium: 4.1 mEq/L (ref 3.5–5.1)
Sodium: 141 mEq/L (ref 135–145)
Total Bilirubin: 0.4 mg/dL (ref 0.2–1.2)
Total Protein: 6.5 g/dL (ref 6.0–8.3)

## 2017-07-05 LAB — TSH: TSH: 2.36 u[IU]/mL (ref 0.35–4.50)

## 2017-07-05 LAB — HEMOGLOBIN A1C: Hgb A1c MFr Bld: 5.9 % (ref 4.6–6.5)

## 2017-08-20 ENCOUNTER — Other Ambulatory Visit: Payer: Self-pay | Admitting: Family Medicine

## 2017-08-20 NOTE — Telephone Encounter (Signed)
Please advise on refill.

## 2017-08-21 ENCOUNTER — Other Ambulatory Visit: Payer: Self-pay | Admitting: Family Medicine

## 2017-08-21 NOTE — Telephone Encounter (Signed)
Okay 

## 2017-08-22 NOTE — Telephone Encounter (Signed)
MEDICATION: phentermine (ADIPEX-P) 37.5 MG tablet  PHARMACY:   [id number] - [place], [place] - [address] [phone] (Phone) [phone] (Fax)     IS THIS A 90 DAY SUPPLY : no  IS PATIENT OUT OF MEDICATION: yes  IF NOT; HOW MUCH IS LEFT:n/a   LAST APPOINTMENT DATE: /13/18  NEXT APPOINTMENT DATE:@Visit  date not found  OTHER COMMENTS: Call [facility] to clarify the refill request for phentermine (ADIPEX-P) 37.5 MG tablet.    **Let patient know to contact pharmacy at the end of the day to make sure medication is ready. **  ** Please notify patient to allow 48-72 hours to process**  **Encourage patient to contact the pharmacy for refills or they can request refills through [facility]**

## 2017-08-23 ENCOUNTER — Encounter: Payer: Self-pay | Admitting: Family Medicine

## 2017-08-23 ENCOUNTER — Ambulatory Visit (INDEPENDENT_AMBULATORY_CARE_PROVIDER_SITE_OTHER): Payer: BLUE CROSS/BLUE SHIELD | Admitting: Physician Assistant

## 2017-08-23 VITALS — BP 114/70 | HR 88 | Temp 98.4°F | Wt 259.2 lb

## 2017-08-23 DIAGNOSIS — J069 Acute upper respiratory infection, unspecified: Secondary | ICD-10-CM

## 2017-08-23 DIAGNOSIS — R0789 Other chest pain: Secondary | ICD-10-CM

## 2017-08-23 MED ORDER — PHENTERMINE HCL 37.5 MG PO TABS
37.5000 mg | ORAL_TABLET | Freq: Every day | ORAL | 0 refills | Status: DC
Start: 2017-08-23 — End: 2017-12-13

## 2017-08-23 MED ORDER — AMOXICILLIN-POT CLAVULANATE 875-125 MG PO TABS: 1.0000 | ORAL_TABLET | Freq: Two times a day (BID) | ORAL | 0 refills | Status: DC

## 2017-08-23 MED ORDER — PREDNISONE 20 MG PO TABS: 40.0000 mg | ORAL_TABLET | Freq: Every day | ORAL | 0 refills | Status: DC
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
## 2017-08-23 NOTE — Patient Instructions (Addendum)
It was great to meet you!  Start the low dose steroid, take with food.   If you are not feeling better by next week, or if you develop fever, productive cough, or other symptoms, begin the antibiotic. If you begin the antibiotic, take as directed and complete the entire course.  Do not start the phentermine until you are feeling well.  Please schedule a nurse visit for your flu shot.  Seek medical care if worsening chest pain or shortness of breath.

## 2017-08-23 NOTE — Telephone Encounter (Signed)
Patient received refills when she was in the office today.

## 2017-11-26 DIAGNOSIS — M79642 Pain in left hand: Secondary | ICD-10-CM | POA: Diagnosis not present

## 2017-11-26 DIAGNOSIS — M7741 Metatarsalgia, right foot: Secondary | ICD-10-CM | POA: Diagnosis not present

## 2017-12-13 ENCOUNTER — Other Ambulatory Visit: Payer: Self-pay

## 2017-12-13 ENCOUNTER — Ambulatory Visit: Payer: BLUE CROSS/BLUE SHIELD | Admitting: Physician Assistant

## 2017-12-13 ENCOUNTER — Encounter: Payer: Self-pay | Admitting: Physician Assistant

## 2017-12-13 VITALS — BP 110/72 | HR 84 | Temp 98.3°F | Resp 14 | Ht 63.0 in | Wt 270.0 lb

## 2017-12-13 DIAGNOSIS — B9789 Other viral agents as the cause of diseases classified elsewhere: Secondary | ICD-10-CM

## 2017-12-13 DIAGNOSIS — J069 Acute upper respiratory infection, unspecified: Secondary | ICD-10-CM

## 2017-12-13 MED ORDER — BENZONATATE 100 MG PO CAPS: 100.0000 mg | ORAL_CAPSULE | Freq: Three times a day (TID) | ORAL | 0 refills | Status: DC | PRN

## 2017-12-13 NOTE — Patient Instructions (Signed)
Please stay well-hydrated and get plenty of rest. Start the [facility] for cough. Resume Flonase and saline nasal rinses. Mucinex-DM for cough and congestion.  I would recommend an echinacea supplement.  Viral illness can last a week or so but usually the first few days are the worst and things then improve.  Please call me or come to see me if you note any new or worsening symptoms after the weekend.    Upper Respiratory Infection, Adult Most upper respiratory infections (URIs) are caused by a virus. A URI affects the nose, throat, and upper air passages. The most common type of URI is often called "the common cold." Follow these instructions at home:  Take medicines only as told by your doctor.  Gargle warm saltwater or take cough drops to comfort your throat as told by your doctor.  Use a warm mist humidifier or inhale steam from a shower to increase air moisture. This may make it easier to breathe.  Drink enough fluid to keep your pee (urine) clear or pale yellow.  Eat soups and other clear broths.  Have a healthy diet.  Rest as needed.  Go back to work when your fever is gone or your doctor says it is okay. ? You may need to stay home longer to avoid giving your URI to others. ? You can also wear a face mask and wash your hands often to prevent spread of the virus.  Use your inhaler more if you have asthma.  Do not use any tobacco products, including cigarettes, chewing tobacco, or electronic cigarettes. If you need help quitting, ask your doctor. Contact a doctor if:  You are getting worse, not better.  Your symptoms are not helped by medicine.  You have chills.  You are getting more short of breath.  You have brown or red mucus.  You have yellow or brown discharge from your nose.  You have pain in your face, especially when you bend forward.  You have a fever.  You have puffy (swollen) neck glands.  You have pain while swallowing.  You have white areas  in the back of your throat. Get help right away if:  You have very bad or constant: ? Headache. ? Ear pain. ? Pain in your forehead, behind your eyes, and over your cheekbones (sinus pain). ? Chest pain.  You have long-lasting (chronic) lung disease and any of the following: ? Wheezing. ? Long-lasting cough. ? Coughing up blood. ? A change in your usual mucus.  You have a stiff neck.  You have changes in your: ? Vision. ? Hearing. ? Thinking. ? Mood. This information is not intended to replace advice given to you by your health care provider. Make sure you discuss any questions you have with your health care provider. Document Released: 04/23/2008 Document Revised: 07/08/2016 Document Reviewed: 02/10/2014 Elsevier Interactive Patient Education  2018 [employer]
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
## 2017-12-17 ENCOUNTER — Telehealth: Payer: Self-pay | Admitting: Family Medicine

## 2017-12-17 NOTE — Telephone Encounter (Signed)
Copied from CRM [id number]. Topic: Quick Communication - See Telephone Encounter >> Dec 17, 2017  2:17 PM Cipriano BunkerLambe, Annette S wrote: CRM for notification. See Telephone encounter for:   Pt. Went to [facility] on Friday and saw Malva CoganCody Martin but was not checked for the flu, was just given cough medicine, but she is still sick. She has been taking Aleve and Mucanex DM all weekend with no help.    Both her twins and older son were tested today and showed they have Flu strand A.  She is asking if can call something in for her.   Please call pt. If any questions.  [id number]- [place], [place] - [address] [place] [address] Phone: [phone] Fax: [phone]    12/17/17.

## 2017-12-17 NOTE — Telephone Encounter (Signed)
  Pt. Seen by Malva Coganody Martin Friday for cough, congestion, sore throat. Has been taking Tessalon, Aleve and Mucinex DM since, ineffective. Now reports both her twins and older son tested positive for Type A flu.  She is asking if something can be called in for her.    [id number]- [place], [place] - [address]  [place] [address] Phone: [phone] Fax: [phone]

## 2017-12-18 ENCOUNTER — Other Ambulatory Visit: Payer: Self-pay | Admitting: Physician Assistant

## 2017-12-18 MED ORDER — PROMETHAZINE-DM 6.25-15 MG/5ML PO SYRP: 5.0000 mL | ORAL_SOLUTION | Freq: Four times a day (QID) | ORAL | 0 refills | Status: DC | PRN

## 2017-12-18 NOTE — Telephone Encounter (Signed)
Discussed the notes below with patient. She stated verbal understanding.   States that she will call her PCP office if this does not improve symptoms over the next couple days.

## 2017-12-18 NOTE — Telephone Encounter (Signed)
She is outside the window for Tamiflu. Most people do not require tamilfu unless they are elderly or have severe respiratory issues. We can send in some promethazine-DM (rx sent) syrup to help with symptoms as body fights this off. Recommend she take a zinc or echinacea supplement until symptom resolution -- this will help her body fight off the virus.

## 2018-01-03 ENCOUNTER — Ambulatory Visit (INDEPENDENT_AMBULATORY_CARE_PROVIDER_SITE_OTHER): Payer: BLUE CROSS/BLUE SHIELD

## 2018-01-03 ENCOUNTER — Encounter: Payer: Self-pay | Admitting: Family Medicine

## 2018-01-03 ENCOUNTER — Ambulatory Visit: Payer: BLUE CROSS/BLUE SHIELD | Admitting: Family Medicine

## 2018-01-03 VITALS — BP 110/80 | HR 72 | Temp 99.8°F | Wt 265.0 lb

## 2018-01-03 DIAGNOSIS — R059 Cough, unspecified: Secondary | ICD-10-CM

## 2018-01-03 DIAGNOSIS — R0602 Shortness of breath: Secondary | ICD-10-CM | POA: Diagnosis not present

## 2018-01-03 DIAGNOSIS — R739 Hyperglycemia, unspecified: Secondary | ICD-10-CM | POA: Diagnosis not present

## 2018-01-03 DIAGNOSIS — R05 Cough: Secondary | ICD-10-CM | POA: Diagnosis not present

## 2018-01-03 DIAGNOSIS — R071 Chest pain on breathing: Secondary | ICD-10-CM | POA: Diagnosis not present

## 2018-01-03 DIAGNOSIS — J40 Bronchitis, not specified as acute or chronic: Secondary | ICD-10-CM | POA: Diagnosis not present

## 2018-01-03 MED ORDER — DOXYCYCLINE HYCLATE 100 MG PO TABS: 100.0000 mg | ORAL_TABLET | Freq: Two times a day (BID) | ORAL | 0 refills | Status: DC

## 2018-01-03 MED ORDER — PREDNISONE 5 MG PO TABS: ORAL_TABLET | ORAL | 0 refills | Status: DC

## 2018-01-03 NOTE — Progress Notes (Signed)
Mikayla [name] is a 52 y.o. female here for an acute visit.  History of Present Illness:   Mikayla [name], CMA acting as scribe for Dr. Helane RimaErica Sritha Chauncey.   Cough  This is a new problem. The current episode started in the past 7 days. The problem has been gradually worsening. The cough is non-productive. Associated symptoms include a fever and shortness of breath. Pertinent negatives include no wheezing. The treatment provided no relief. There is no history of asthma, COPD or emphysema.  :  Long history of smoke exposure. Cough becoming chronic and worsening. Patient concerned about strong FamilyHx of lung cancer and requests a CT chest.   PMHx, SurgHx, SocialHx, Medications, and Allergies were reviewed in the Visit Navigator and updated as appropriate.  Current Medications:   .  betamethasone dipropionate (DIPROLENE) 0.05 % ointment, Apply topically 2 (two) times daily., Disp: 30 g, Rfl: 0 .  furosemide (LASIX) 20 MG tablet, Take 1 tablet (20 mg total) by mouth as needed., Disp: 30 tablet, Rfl: 1 .  LORazepam (ATIVAN) 0.5 MG tablet, Take 1 tablet (0.5 mg total) by mouth at bedtime as needed for anxiety (qhs prn)., Disp: 30 tablet, Rfl: 0 .  Multiple Vitamin (MULTIVITAMIN) tablet, Take 1 tablet by mouth daily., Disp: , Rfl:    Allergies  Allergen Reactions  . Codeine Itching   Review of Systems:   Pertinent items are noted in the HPI. Otherwise, ROS is negative.  Vitals:   Vitals:   01/03/18 1510  BP: 110/80  Pulse: 72  Temp: 99.8 F (37.7 C)  TempSrc: Oral  SpO2: 95%  Weight: 265 lb (120.2 kg)     Body mass index is 46.94 kg/m.  Physical Exam:   Physical Exam  Constitutional: She is oriented to person, place, and time. She appears well-developed and well-nourished. No distress.  HENT:  Head: Normocephalic and atraumatic.  Right Ear: External ear normal.  Left Ear: External ear normal.  Nose: Nose normal.  Mouth/Throat: Oropharynx is clear and moist.  Eyes:  Conjunctivae and EOM are normal. Pupils are equal, round, and reactive to light.  Neck: Normal range of motion. Neck supple.  Cardiovascular: Normal rate, regular rhythm, normal heart sounds and intact distal pulses.  Pulmonary/Chest: Effort normal. She has wheezes. She has rhonchi.  Abdominal: Soft.  Neurological: She is alert and oriented to person, place, and time.  Skin: Skin is warm.  Psychiatric: She has a normal mood and affect. Her behavior is normal.  Nursing note and vitals reviewed.   Results for orders placed or performed in visit on 01/03/18  CBC with Differential/Platelet  Result Value Ref Range   WBC 5.5 3.8 - 10.8 Thousand/uL   RBC 4.13 3.80 - 5.10 Million/uL   Hemoglobin 12.2 11.7 - 15.5 g/dL   HCT 16.135.6 09.635.0 - 04.545.0 %   MCV 86.2 80.0 - 100.0 fL   MCH 29.5 27.0 - 33.0 pg   MCHC 34.3 32.0 - 36.0 g/dL   RDW 40.913.1 81.111.0 - 91.415.0 %   Platelets 187 140 - 400 Thousand/uL   MPV 12.9 (H) 7.5 - 12.5 fL   Neutro Abs 2,822 1,500 - 7,800 cells/uL   Lymphs Abs 2,002 850 - 3,900 cells/uL   WBC mixed population 457 200 - 950 cells/uL   Eosinophils Absolute 198 15 - 500 cells/uL   Basophils Absolute 22 0 - 200 cells/uL   Neutrophils Relative % 51.3 %   Total Lymphocyte 36.4 %   Monocytes Relative 8.3 %  Eosinophils Relative 3.6 %   Basophils Relative 0.4 %  Comprehensive metabolic panel  Result Value Ref Range   Glucose, Bld 127 (H) 65 - 99 mg/dL   BUN 17 7 - 25 mg/dL   Creat 1.61 0.96 - 0.45 mg/dL   BUN/Creatinine Ratio NOT APPLICABLE 6 - 22 (calc)   Sodium 140 135 - 146 mmol/L   Potassium 4.2 3.5 - 5.3 mmol/L   Chloride 105 98 - 110 mmol/L   CO2 23 20 - 32 mmol/L   Calcium 8.9 8.6 - 10.4 mg/dL   Total Protein 6.0 (L) 6.1 - 8.1 g/dL   Albumin 3.7 3.6 - 5.1 g/dL   Globulin 2.3 1.9 - 3.7 g/dL (calc)   AG Ratio 1.6 1.0 - 2.5 (calc)   Total Bilirubin 0.2 0.2 - 1.2 mg/dL   Alkaline phosphatase (APISO) 74 33 - 130 U/L   AST 14 10 - 35 U/L   ALT 18 6 - 29 U/L  Hemoglobin  A1c  Result Value Ref Range   Hgb A1c MFr Bld 5.8 (H) <5.7 % of total Hgb   Mean Plasma Glucose 120 (calc)   eAG (mmol/L) 6.6 (calc)    Assessment and Plan:   1. Cough - DG Chest 2 View - doxycycline (VIBRA-TABS) 100 MG tablet; Take 1 tablet (100 mg total) by mouth 2 (two) times daily.  Dispense: 20 tablet; Refill: 0 - predniSONE (DELTASONE) 5 MG tablet; 6-5-4-3-2-1-off  Dispense: 21 tablet; Refill: 0 - CBC with Differential/Platelet - Comprehensive metabolic panel  2. SOB (shortness of breath) - CT Chest Wo Contrast  3. Chest pain on breathing - CT Chest Wo Contrast  4. Hyperglycemia - Hemoglobin A1c  5. Morbid obesity (HCC) The patient is asked to make an attempt to improve diet and exercise patterns to aid in medical management of this problem.    . Reviewed expectations re: course of current medical issues. . Discussed self-management of symptoms. . Outlined signs and symptoms indicating need for more acute intervention. . Patient verbalized understanding and all questions were answered. Marland Kitchen Health Maintenance issues including appropriate healthy diet, exercise, and smoking avoidance were discussed with patient. . See orders for this visit as documented in the electronic medical record. . Patient received an After Visit Summary.  CMA served as Neurosurgeon during this visit. History, Physical, and Plan performed by medical provider. The above documentation has been reviewed and is accurate and complete. Helane Rima, D.O.  Helane Rima, DO , [facility] 01/05/2018

## 2018-01-04 LAB — CBC WITH DIFFERENTIAL/PLATELET
Basophils Absolute: 22 cells/uL (ref 0–200)
Basophils Relative: 0.4 %
Eosinophils Absolute: 198 cells/uL (ref 15–500)
Eosinophils Relative: 3.6 %
HCT: 35.6 % (ref 35.0–45.0)
Hemoglobin: 12.2 g/dL (ref 11.7–15.5)
Lymphs Abs: 2002 cells/uL (ref 850–3900)
MCH: 29.5 pg (ref 27.0–33.0)
MCHC: 34.3 g/dL (ref 32.0–36.0)
MCV: 86.2 fL (ref 80.0–100.0)
MPV: 12.9 fL — ABNORMAL HIGH (ref 7.5–12.5)
Monocytes Relative: 8.3 %
Neutro Abs: 2822 cells/uL (ref 1500–7800)
Neutrophils Relative %: 51.3 %
Platelets: 187 10*3/uL (ref 140–400)
RBC: 4.13 10*6/uL (ref 3.80–5.10)
RDW: 13.1 % (ref 11.0–15.0)
Total Lymphocyte: 36.4 %
WBC mixed population: 457 cells/uL (ref 200–950)
WBC: 5.5 10*3/uL (ref 3.8–10.8)

## 2018-01-04 LAB — HEMOGLOBIN A1C
Hgb A1c MFr Bld: 5.8 % of total Hgb — ABNORMAL HIGH (ref <5.7)
Mean Plasma Glucose: 120 (calc)
eAG (mmol/L): 6.6 (calc)

## 2018-01-04 LAB — COMPREHENSIVE METABOLIC PANEL
AG Ratio: 1.6 (calc) (ref 1.0–2.5)
ALT: 18 U/L (ref 6–29)
AST: 14 U/L (ref 10–35)
Albumin: 3.7 g/dL (ref 3.6–5.1)
Alkaline phosphatase (APISO): 74 U/L (ref 33–130)
BUN: 17 mg/dL (ref 7–25)
CO2: 23 mmol/L (ref 20–32)
Calcium: 8.9 mg/dL (ref 8.6–10.4)
Chloride: 105 mmol/L (ref 98–110)
Creat: 0.58 mg/dL (ref 0.50–1.05)
Globulin: 2.3 g/dL (calc) (ref 1.9–3.7)
Glucose, Bld: 127 mg/dL — ABNORMAL HIGH (ref 65–99)
Potassium: 4.2 mmol/L (ref 3.5–5.3)
Sodium: 140 mmol/L (ref 135–146)
Total Bilirubin: 0.2 mg/dL (ref 0.2–1.2)
Total Protein: 6 g/dL — ABNORMAL LOW (ref 6.1–8.1)

## 2018-01-09 ENCOUNTER — Ambulatory Visit (INDEPENDENT_AMBULATORY_CARE_PROVIDER_SITE_OTHER)
Admission: RE | Admit: 2018-01-09 | Discharge: 2018-01-09 | Disposition: A | Discharge status: Home / Self Care | Payer: BLUE CROSS/BLUE SHIELD | Source: Ambulatory Visit | Attending: Family Medicine | Admitting: Family Medicine

## 2018-01-09 DIAGNOSIS — R071 Chest pain on breathing: Secondary | ICD-10-CM | POA: Diagnosis not present

## 2018-01-09 DIAGNOSIS — R0602 Shortness of breath: Secondary | ICD-10-CM | POA: Diagnosis not present

## 2018-02-21 ENCOUNTER — Other Ambulatory Visit: Payer: Self-pay | Admitting: Family Medicine

## 2018-02-21 DIAGNOSIS — E8881 Metabolic syndrome: Secondary | ICD-10-CM

## 2018-03-11 ENCOUNTER — Other Ambulatory Visit: Payer: Self-pay | Admitting: Family Medicine

## 2018-03-11 ENCOUNTER — Telehealth: Payer: Self-pay | Admitting: Family Medicine

## 2018-03-11 DIAGNOSIS — Z1239 Encounter for other screening for malignant neoplasm of breast: Secondary | ICD-10-CM

## 2018-03-11 DIAGNOSIS — N644 Mastodynia: Secondary | ICD-10-CM

## 2018-03-11 DIAGNOSIS — Z1231 Encounter for screening mammogram for malignant neoplasm of breast: Secondary | ICD-10-CM

## 2018-03-11 NOTE — Telephone Encounter (Signed)
See note  Copied from CRM [id number]. Topic: Referral - Request >> Mar 11, 2018 12:08 PM [name], Mikayla [name]: Reason for CRM: pt called to request an ultrasound on her breasts, pt is due for mammogram but was needing an ultrasound done before, call pt to advise

## 2018-03-11 NOTE — Telephone Encounter (Signed)
Please advise on order. Do you want the patient to be seen?

## 2018-03-11 NOTE — Telephone Encounter (Signed)
Okay order. 

## 2018-03-12 ENCOUNTER — Other Ambulatory Visit: Payer: Self-pay | Admitting: Family Medicine

## 2018-03-12 DIAGNOSIS — N644 Mastodynia: Secondary | ICD-10-CM

## 2018-03-12 NOTE — Telephone Encounter (Signed)
Orders have been placed.

## 2018-03-13 ENCOUNTER — Ambulatory Visit: Payer: BLUE CROSS/BLUE SHIELD

## 2018-03-13 ENCOUNTER — Ambulatory Visit
Admission: RE | Admit: 2018-03-13 | Discharge: 2018-03-13 | Disposition: A | Discharge status: Home / Self Care | Payer: BLUE CROSS/BLUE SHIELD | Source: Ambulatory Visit | Attending: Family Medicine | Admitting: Family Medicine

## 2018-03-13 DIAGNOSIS — N644 Mastodynia: Secondary | ICD-10-CM

## 2018-03-13 DIAGNOSIS — R928 Other abnormal and inconclusive findings on diagnostic imaging of breast: Secondary | ICD-10-CM | POA: Diagnosis not present

## 2018-03-17 ENCOUNTER — Other Ambulatory Visit: Payer: BLUE CROSS/BLUE SHIELD

## 2018-03-18 ENCOUNTER — Ambulatory Visit (INDEPENDENT_AMBULATORY_CARE_PROVIDER_SITE_OTHER): Payer: BLUE CROSS/BLUE SHIELD | Admitting: Family Medicine

## 2018-03-18 ENCOUNTER — Encounter: Payer: Self-pay | Admitting: Family Medicine

## 2018-03-18 VITALS — BP 122/84 | HR 81 | Temp 98.7°F | Ht 63.0 in | Wt 261.4 lb

## 2018-03-18 DIAGNOSIS — R5383 Other fatigue: Secondary | ICD-10-CM

## 2018-03-18 DIAGNOSIS — Z1159 Encounter for screening for other viral diseases: Secondary | ICD-10-CM | POA: Diagnosis not present

## 2018-03-18 DIAGNOSIS — Z0000 Encounter for general adult medical examination without abnormal findings: Secondary | ICD-10-CM

## 2018-03-18 DIAGNOSIS — Z114 Encounter for screening for human immunodeficiency virus [HIV]: Secondary | ICD-10-CM

## 2018-03-18 DIAGNOSIS — Z13220 Encounter for screening for lipoid disorders: Secondary | ICD-10-CM
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
## 2018-03-19 ENCOUNTER — Other Ambulatory Visit (INDEPENDENT_AMBULATORY_CARE_PROVIDER_SITE_OTHER): Payer: BLUE CROSS/BLUE SHIELD

## 2018-03-19 DIAGNOSIS — Z1159 Encounter for screening for other viral diseases: Secondary | ICD-10-CM | POA: Diagnosis not present

## 2018-03-19 DIAGNOSIS — E559 Vitamin D deficiency, unspecified: Secondary | ICD-10-CM

## 2018-03-19 DIAGNOSIS — Z13220 Encounter for screening for lipoid disorders: Secondary | ICD-10-CM

## 2018-03-19 DIAGNOSIS — Z114 Encounter for screening for human immunodeficiency virus [HIV]: Secondary | ICD-10-CM | POA: Diagnosis not present

## 2018-03-19 DIAGNOSIS — R5383 Other fatigue: Secondary | ICD-10-CM | POA: Diagnosis not present

## 2018-03-19 LAB — VITAMIN D 25 HYDROXY (VIT D DEFICIENCY, FRACTURES): VITD: 22.25 ng/mL — ABNORMAL LOW (ref 30.00–100.00)

## 2018-03-19 LAB — LIPID PANEL
Cholesterol: 215 mg/dL — ABNORMAL HIGH (ref 0–200)
HDL: 46.6 mg/dL (ref >39.00)
LDL Cholesterol: 138 mg/dL — ABNORMAL HIGH (ref 0–99)
NonHDL: 168.87
Total CHOL/HDL Ratio: 5
Triglycerides: 152 mg/dL — ABNORMAL HIGH (ref 0.0–149.0)
VLDL: 30.4 mg/dL (ref 0.0–40.0)

## 2018-03-20 ENCOUNTER — Encounter: Payer: Self-pay | Admitting: Family Medicine

## 2018-03-20 LAB — FSH/LH
FSH: 27.5 m[IU]/mL
LH: 20.4 m[IU]/mL

## 2018-03-20 LAB — HEPATITIS C ANTIBODY
Hepatitis C Ab: NONREACTIVE
SIGNAL TO CUT-OFF: 0.04 (ref <1.00)

## 2018-03-20 LAB — HIV ANTIBODY (ROUTINE TESTING W REFLEX): HIV 1&2 Ab, 4th Generation: NONREACTIVE

## 2018-03-22 ENCOUNTER — Encounter: Payer: Self-pay | Admitting: Family Medicine

## 2018-03-24 ENCOUNTER — Other Ambulatory Visit: Payer: Self-pay | Admitting: Surgical

## 2018-03-24 ENCOUNTER — Encounter: Payer: BLUE CROSS/BLUE SHIELD | Admitting: Family Medicine

## 2018-03-24 ENCOUNTER — Encounter: Payer: Self-pay | Admitting: Family Medicine

## 2018-03-24 DIAGNOSIS — Z79890 Hormone replacement therapy (postmenopausal): Secondary | ICD-10-CM

## 2018-03-24 MED ORDER — CHOLECALCIFEROL 1.25 MG (50000 UT) PO TABS: ORAL_TABLET | ORAL | 0 refills | Status: DC

## 2018-03-24 NOTE — Addendum Note (Signed)
Addended by: Helane Rima R on: 03/24/2018 08:21 AM   Modules accepted: Orders

## 2018-04-01 ENCOUNTER — Encounter: Payer: Self-pay | Admitting: Obstetrics and Gynecology

## 2018-04-01 ENCOUNTER — Other Ambulatory Visit (HOSPITAL_COMMUNITY)
Admission: RE | Admit: 2018-04-01 | Discharge: 2018-04-01 | Disposition: A | Discharge status: Home / Self Care | Payer: BLUE CROSS/BLUE SHIELD | Source: Ambulatory Visit | Attending: Obstetrics and Gynecology | Admitting: Obstetrics and Gynecology

## 2018-04-01 ENCOUNTER — Ambulatory Visit: Payer: BLUE CROSS/BLUE SHIELD | Admitting: Obstetrics and Gynecology

## 2018-04-01 ENCOUNTER — Other Ambulatory Visit: Payer: Self-pay

## 2018-04-01 VITALS — BP 100/60 | HR 84 | Resp 18 | Ht 62.25 in | Wt 258.0 lb

## 2018-04-01 DIAGNOSIS — N952 Postmenopausal atrophic vaginitis: Secondary | ICD-10-CM

## 2018-04-01 DIAGNOSIS — Z01419 Encounter for gynecological examination (general) (routine) without abnormal findings: Secondary | ICD-10-CM | POA: Diagnosis not present

## 2018-04-01 DIAGNOSIS — R6882 Decreased libido: Secondary | ICD-10-CM | POA: Diagnosis not present

## 2018-04-01 DIAGNOSIS — N951 Menopausal and female climacteric states: Secondary | ICD-10-CM | POA: Diagnosis not present

## 2018-04-01 DIAGNOSIS — Z1272 Encounter for screening for malignant neoplasm of vagina: Secondary | ICD-10-CM

## 2018-04-01 DIAGNOSIS — R5383 Other fatigue: Secondary | ICD-10-CM

## 2018-04-01 DIAGNOSIS — Z124 Encounter for screening for malignant neoplasm of cervix: Secondary | ICD-10-CM | POA: Insufficient documentation

## 2018-04-01 DIAGNOSIS — F341 Dysthymic disorder: Secondary | ICD-10-CM | POA: Diagnosis not present

## 2018-04-01 DIAGNOSIS — N941 Unspecified dyspareunia: Secondary | ICD-10-CM

## 2018-04-01 MED ORDER — ESTRADIOL 10 MCG VA TABS: ORAL_TABLET | VAGINAL | 0 refills | Status: DC

## 2018-04-01 MED ORDER — CITALOPRAM HYDROBROMIDE 20 MG PO TABS: ORAL_TABLET | ORAL | 1 refills | Status: DC
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
## 2018-04-01 NOTE — Patient Instructions (Signed)
You can try estroven or estroven pm for vasomotor symptoms  EXERCISE AND DIET:  We recommended that you start or continue a regular exercise program for good health. Regular exercise means any activity that makes your heart beat faster and makes you sweat.  We recommend exercising at least 30 minutes per day at least 3 days a week, preferably 4 or 5.  We also recommend a diet low in fat and sugar.  Inactivity, poor dietary choices and obesity can cause diabetes, heart attack, stroke, and kidney damage, among others.    ALCOHOL AND SMOKING:  Women should limit their alcohol intake to no more than 7 drinks/beers/glasses of wine (combined, not each!) per week. Moderation of alcohol intake to this level decreases your risk of breast cancer and liver damage. And of course, no recreational drugs are part of a healthy lifestyle.  And absolutely no smoking or even second hand smoke. Most people know smoking can cause heart and lung diseases, but did you know it also contributes to weakening of your bones? Aging of your skin?  Yellowing of your teeth and nails?  CALCIUM AND VITAMIN D:  Adequate intake of calcium and Vitamin D are recommended.  The recommendations for exact amounts of these supplements seem to change often, but generally speaking 600 mg of calcium (either carbonate or citrate) and 800 units of Vitamin D per day seems prudent. Certain women may benefit from higher intake of Vitamin D.  If you are among these women, your doctor will have told you during your visit.    PAP SMEARS:  Pap smears, to check for cervical cancer or precancers,  have traditionally been done yearly, although recent scientific advances have shown that most women can have pap smears less often.  However, every woman still should have a physical exam from her gynecologist every year. It will include a breast check, inspection of the vulva and vagina to check for abnormal growths or skin changes, a visual exam of the cervix, and then  an exam to evaluate the size and shape of the uterus and ovaries.  And after 54 years of age, a rectal exam is indicated to check for rectal cancers. We will also provide age appropriate advice regarding health maintenance, like when you should have certain vaccines, screening for sexually transmitted diseases, bone density testing, colonoscopy, mammograms, etc.   MAMMOGRAMS:  All women over 53 years old should have a yearly mammogram. Many facilities now offer a "3D" mammogram, which may cost around $50 extra out of pocket. If possible,  we recommend you accept the option to have the 3D mammogram performed.  It both reduces the number of women who will be called back for extra views which then turn out to be normal, and it is better than the routine mammogram at detecting truly abnormal areas.    COLONOSCOPY:  Colonoscopy to screen for colon cancer is recommended for all women at age 79.  We know, you hate the idea of the prep.  We agree, BUT, having colon cancer and not knowing it is worse!!  Colon cancer so often starts as a polyp that can be seen and removed at colonscopy, which can quite literally save your life!  And if your first colonoscopy is normal and you have no family history of colon cancer, most women don't have to have it again for 10 years.  Once every ten years, you can do something that may end up saving your life, right?  We will be  happy to help you get it scheduled when you are ready.  Be sure to check your insurance coverage so you understand how much it will cost.  It may be covered as a preventative service at no cost, but you should check your particular policy.      Menopause and Hormone Replacement Therapy What is hormone replacement therapy? Hormone replacement therapy (HRT) is the use of artificial (synthetic) hormones to replace hormones that your body stops producing during menopause. Menopause is the normal time of life when menstrual periods stop completely and the  ovaries stop producing the female hormones estrogen and progesterone. This lack of hormones can affect your health and cause undesirable symptoms. HRT can relieve some of those symptoms. What are my options for HRT? HRT may consist of the synthetic hormones estrogen and progestin, or it may consist of only estrogen (estrogen-only therapy). You and your health care provider will decide which form of HRT is best for you. If you choose to be on HRT and you have a uterus, estrogen and progestin are usually prescribed. Estrogen-only therapy is used for women who do not have a uterus. Possible options for taking HRT include:  Pills.  Patches.  Gels.  Sprays.  Vaginal cream.  Vaginal rings.  Vaginal inserts.  The amount of hormone(s) that you take and how long you take the hormone(s) varies depending on your individual health. It is important to:  Begin HRT with the lowest possible dosage.  Stop HRT as soon as your health care provider tells you to stop.  Work with your health care provider so that you feel informed and comfortable with your decisions.  What are the benefits of HRT? HRT can reduce the frequency and severity of menopausal symptoms. Benefits of HRT vary depending on the menopausal symptoms that you have, the severity of your symptoms, and your overall health. HRT may help to improve the following menopausal symptoms:  Hot flashes and night sweats. These are sudden feelings of heat that spread over the face and body. The skin may turn red, like a blush. Night sweats are hot flashes that happen while you are sleeping or trying to sleep.  Bone loss (osteoporosis). The body loses calcium more quickly after menopause, causing the bones to become weaker. This can increase the risk for bone breaks (fractures).  Vaginal dryness. The lining of the vagina can become thin and dry, which can cause pain during sexual intercourse or cause infection, burning, or itching.  Urinary tract  infections.  Urinary incontinence. This is a decreased ability to control when you urinate.  Irritability.  Short-term memory problems.  What are the risks of HRT? Risks of HRT vary depending on your individual health and medical history. Risks of HRT also depend on whether you receive both estrogen and progestin or you receive estrogen only.HRT may increase the risk of:  Spotting. This is when a small amount of bloodleaks from the vagina unexpectedly.  Endometrial cancer. This cancer is in the lining of the uterus (endometrium).  Breast cancer.  Increased density of breast tissue. This can make it harder to find breast cancer on a breast X-ray (mammogram).  Stroke.  Heart attack.  Blood clots.  Gallbladder disease.  Risks of HRT can increase if you have any of the following conditions:  Endometrial cancer.  Liver disease.  Heart disease.  Breast cancer.  History of blood clots.  History of stroke.  How should I care for myself while I am on HRT?  Take over-the-counter and prescription medicines only as told by your health care provider.  Get mammograms, pelvic exams, and medical checkups as often as told by your health care provider.  Have Pap tests done as often as told by your health care provider. A Pap test is sometimes called a Pap smear. It is a screening test that is used to check for signs of cancer of the cervix and vagina. A Pap test can also identify the presence of infection or precancerous changes. Pap tests may be done: ? Every 3 years, starting at age 8. ? Every 5 years, starting after age 24, in combination with testing for human papillomavirus (HPV). ? More often or less often depending on other medical conditions you have, your age, and other risk factors.  It is your responsibility to get your Pap test results. Ask your health care provider or the department performing the test when your results will be ready.  Keep all follow-up visits as  told by your health care provider. This is important. When should I seek medical care? Talk with your health care provider if:  You have any of these: ? Pain or swelling in your legs. ? Shortness of breath. ? Chest pain. ? Lumps or changes in your breasts or armpits. ? Slurred speech. ? Pain, burning, or bleeding when you urine.  You develop any of these: ? Unusual vaginal bleeding. ? Dizziness or headaches. ? Weakness or numbness in any part of your arms or legs. ? Pain in your abdomen.  This information is not intended to replace advice given to you by your health care provider. Make sure you discuss any questions you have with your health care provider. Document Released: 08/04/2003 Document Revised: 10/02/2016 Document Reviewed: 05/09/2015 Elsevier Interactive Patient Education  2017 [employer] Menopause and Herbal Products What is menopause? Menopause is the normal time of life when menstrual periods decrease in frequency and eventually stop completely. This process can take several years for some women. Menopause is complete when you have had an absence of menstruation for a full year since your last menstrual period. It usually occurs between the ages of 52 and 55. It is not common for menopause to begin before the age of 25. During menopause, your body stops producing the female hormones estrogen and progesterone. Common symptoms associated with this loss of hormones (vasomotor symptoms) are:  Hot flashes.  Hot flushes.  Night sweats.  Other common symptoms and complications of menopause include:  Decrease in sex drive.  Vaginal dryness and thinning of the walls of the vagina. This can make sex painful.  Dryness of the skin and development of wrinkles.  Headaches.  Tiredness.  Irritability.  Memory problems.  Weight gain.  Bladder infections.  Hair growth on the face and chest.  Inability to reproduce offspring (infertility).  Loss of density in the  bones (osteoporosis) increasing your risk for breaks (fractures).  Depression.  Hardening and narrowing of the arteries (atherosclerosis). This increases your risk of heart attack and stroke.  What treatment options are available? There are many treatment choices for menopause symptoms. The most common treatment is hormone replacement therapy. Many alternative therapies for menopause are emerging, including the use of herbal products. These supplements can be found in the form of herbs, teas, oils, tinctures, and pills. Common herbal supplements for menopause are made from plants that contain phytoestrogens. Phytoestrogens are compounds that occur naturally in plants and plant products. They act like estrogen in the body. Foods and  herbs that contain phytoestrogens include:  Soy.  Flax seeds.  Red clover.  Ginseng.  What menopause symptoms may be helped if I use herbal products?  Vasomotor symptoms. These may be helped by: ? Soy. Some studies show that soy may have a moderate benefit for hot flashes. ? Black cohosh. There is limited evidence indicating this may be beneficial for hot flashes.  Symptoms that are related to heart and blood vessel disease. These may be helped by soy. Studies have shown that soy can help to lower cholesterol.  Depression. This may be helped by: ? St. John's wort. There is limited evidence that shows this may help mild to moderate depression. ? Black cohosh. There is evidence that this may help depression and mood swings.  Osteoporosis. Soy may help to decrease bone loss that is associated with menopause and may prevent osteoporosis. Limited evidence indicates that red clover may offer some bone loss protection as well. Other herbal products that are commonly used during menopause lack enough evidence to support their use as a replacement for conventional menopause therapies. These products include evening primrose, ginseng, and red clover. What are the  cases when herbal products should not be used during menopause? Do not use herbal products during menopause without your health care provider's approval if:  You are taking medicine.  You have a preexisting liver condition.  Are there any risks in my taking herbal products during menopause? If you choose to use herbal products to help with symptoms of menopause, keep in mind that:  Different supplements have different and unmeasured amounts of herbal ingredients.  Herbal products are not regulated the same way that medicines are.  Concentrations of herbs may vary depending on the way they are prepared. For example, the concentration may be different in a pill, tea, oil, and tincture.  Little is known about the risks of using herbal products, particularly the risks of long-term use.  Some herbal supplements can be harmful when combined with certain medicines.  Most commonly reported side effects of herbal products are mild. However, if used improperly, many herbal supplements can cause serious problems. Talk to your health care provider before starting any herbal product. If problems develop, stop taking the supplement and let your health care provider know. This information is not intended to replace advice given to you by your health care provider. Make sure you discuss any questions you have with your health care provider. Document Released: 04/23/2008 Document Revised: 10/02/2016 Document Reviewed: 04/20/2014 Elsevier Interactive Patient Education  2017 [employer].

## 2018-04-03 ENCOUNTER — Telehealth: Payer: Self-pay | Admitting: Obstetrics and Gynecology

## 2018-04-03 ENCOUNTER — Encounter: Payer: Self-pay | Admitting: Obstetrics and Gynecology

## 2018-04-03 LAB — CYTOLOGY - PAP
Diagnosis: NEGATIVE
HPV: NOT DETECTED

## 2018-04-03 LAB — TESTT+TESTF+SHBG
Sex Hormone Binding: 44.2 nmol/L (ref 17.3–125.0)
Testosterone, Free: 2.8 pg/mL (ref 0.0–4.2)
Testosterone, total: 17.7 ng/dL

## 2018-04-03 LAB — TSH: TSH: 2.16 u[IU]/mL (ref 0.450–4.500)

## 2018-04-03 NOTE — Telephone Encounter (Signed)
Routing to Dr.Jertson for review of labs from 04/01/18.

## 2018-04-03 NOTE — Telephone Encounter (Signed)
mychart message sent

## 2018-04-03 NOTE — Telephone Encounter (Signed)
-----   Message from Mychart, Generic sent at 04/03/2018 12:18 PM EDT -----    Just checking my blood work did it come ok?

## 2018-04-10 DIAGNOSIS — H25813 Combined forms of age-related cataract, bilateral: Secondary | ICD-10-CM | POA: Diagnosis not present

## 2018-04-10 DIAGNOSIS — H04123 Dry eye syndrome of bilateral lacrimal glands: Secondary | ICD-10-CM | POA: Diagnosis not present

## 2018-04-10 DIAGNOSIS — H524 Presbyopia: Secondary | ICD-10-CM | POA: Diagnosis not present

## 2018-04-10 DIAGNOSIS — H5203 Hypermetropia, bilateral: Secondary | ICD-10-CM | POA: Diagnosis not present

## 2018-04-22 ENCOUNTER — Other Ambulatory Visit: Payer: Self-pay | Admitting: Obstetrics and Gynecology

## 2018-04-22 NOTE — Telephone Encounter (Signed)
Spoke with patient- she is scheduled for follow up. She does not need a refill at this time -eh

## 2018-04-22 NOTE — Telephone Encounter (Signed)
The patient should be coming for a f/u visit for Celexa and Yuvafem in the next few weeks. Please schedule this if she doesn't already have an appointment.

## 2018-04-22 NOTE — Telephone Encounter (Signed)
Medication refill request: Yuvafem  Last AEX:  04-01-18  Next AEX: not scheduled  Last MMG (if hormonal medication request): 03-13-18 WNL  Refill authorized: please advise

## 2018-04-23 ENCOUNTER — Other Ambulatory Visit: Payer: Self-pay | Admitting: Family Medicine

## 2018-04-23 ENCOUNTER — Other Ambulatory Visit: Payer: Self-pay | Admitting: Obstetrics and Gynecology

## 2018-05-01 ENCOUNTER — Ambulatory Visit: Payer: BLUE CROSS/BLUE SHIELD | Admitting: Obstetrics and Gynecology

## 2018-05-01 ENCOUNTER — Encounter: Payer: Self-pay | Admitting: Obstetrics and Gynecology

## 2018-05-01 ENCOUNTER — Other Ambulatory Visit: Payer: Self-pay

## 2018-05-01 VITALS — BP 112/64 | HR 72 | Resp 16 | Wt 259.0 lb

## 2018-05-01 DIAGNOSIS — N941 Unspecified dyspareunia: Secondary | ICD-10-CM

## 2018-05-01 DIAGNOSIS — N952 Postmenopausal atrophic vaginitis: Secondary | ICD-10-CM | POA: Diagnosis not present

## 2018-05-01 DIAGNOSIS — N951 Menopausal and female climacteric states: Secondary | ICD-10-CM

## 2018-05-01 MED ORDER — ESTRADIOL 10 MCG VA TABS: ORAL_TABLET | VAGINAL | 3 refills | Status: DC
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
## 2018-05-20 ENCOUNTER — Other Ambulatory Visit: Payer: Self-pay

## 2018-05-20 MED ORDER — FUROSEMIDE 20 MG PO TABS: 20.0000 mg | ORAL_TABLET | ORAL | 0 refills | Status: DC | PRN

## 2018-06-23 DIAGNOSIS — M76821 Posterior tibial tendinitis, right leg: Secondary | ICD-10-CM | POA: Diagnosis not present

## 2018-07-08 DIAGNOSIS — H6122 Impacted cerumen, left ear: Secondary | ICD-10-CM | POA: Diagnosis not present

## 2018-07-08 DIAGNOSIS — H60591 Other noninfective acute otitis externa, right ear: Secondary | ICD-10-CM | POA: Diagnosis not present

## 2018-08-19 ENCOUNTER — Ambulatory Visit: Payer: BLUE CROSS/BLUE SHIELD | Admitting: Physician Assistant

## 2018-08-19 ENCOUNTER — Encounter: Payer: Self-pay | Admitting: Physician Assistant

## 2018-08-19 VITALS — BP 120/74 | HR 75 | Temp 98.0°F | Ht 62.25 in | Wt 255.0 lb

## 2018-08-19 DIAGNOSIS — H9203 Otalgia, bilateral: Secondary | ICD-10-CM

## 2018-08-19 DIAGNOSIS — R300 Dysuria: Secondary | ICD-10-CM

## 2018-08-19 LAB — POCT URINALYSIS DIPSTICK
Bilirubin, UA: NEGATIVE
Blood, UA: NEGATIVE
Glucose, UA: NEGATIVE
Ketones, UA: NEGATIVE
Nitrite, UA: NEGATIVE
Protein, UA: NEGATIVE
Spec Grav, UA: >=1.03 — AB (ref 1.010–1.025)
Urobilinogen, UA: 0.2 E.U./dL
pH, UA: 5.5 (ref 5.0–8.0)

## 2018-08-19 MED ORDER — PHENAZOPYRIDINE HCL 95 MG PO TABS: 95.0000 mg | ORAL_TABLET | Freq: Three times a day (TID) | ORAL | 0 refills | Status: DC | PRN

## 2018-08-19 MED ORDER — NITROFURANTOIN MONOHYD MACRO 100 MG PO CAPS: 100.0000 mg | ORAL_CAPSULE | Freq: Two times a day (BID) | ORAL | 0 refills | Status: DC

## 2018-08-19 NOTE — Patient Instructions (Addendum)
It was great to see you!  I will be in touch once I have your urine culture result.  Push fluids.  Contact a doctor if:  You have back pain.  You have a fever.  You feel sick to your stomach (nauseous).  You throw up (vomit).  Your symptoms do not get better after 3 days.  Your symptoms go away and then come back. Get help right away if:  You have very bad back pain.  You have very bad lower belly (abdominal) pain.  You are throwing up and cannot keep down any medicines or water. This information is not intended to replace advice given to you by your health care provider. Make sure you discuss any questions you have with your health care provider.
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
## 2018-08-20 LAB — URINE CULTURE
MICRO NUMBER:: 91177863
SPECIMEN QUALITY:: ADEQUATE

## 2018-08-26 NOTE — Progress Notes (Signed)
Mikayla [name] is a 52 y.o. female is here for follow up.  History of Present Illness:   HPI: See Assessment and Plan section for Problem Based Charting of issues discussed today.   There are no preventive care reminders to display for this patient.   Depression screen [facility] 2/9 11/02/2016 10/09/2016  Decreased Interest 0 0  Down, Depressed, Hopeless 0 0  PHQ - 2 Score 0 0  Altered sleeping 0 0  Tired, decreased energy 0 0  Change in appetite 0 0  Feeling bad or failure about yourself  0 0  Trouble concentrating 0 0  Moving slowly or fidgety/restless 0 0  Suicidal thoughts 0 0  PHQ-9 Score 0 0   PMHx, SurgHx, SocialHx, FamHx, Medications, and Allergies were reviewed in the Visit Navigator and updated as appropriate.   Patient Active Problem List   Diagnosis Date Noted  . Borderline diabetes 07/05/2017  . Insulin resistance 02/19/2017  . Hyperlipidemia   . Constipation 04/12/2015  . 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 Vitamin D deficiency 08/13/2012  . Depression 11/27/2011  . Morbid obesity (HCC) 04/11/2010  . GERD 02/10/2009   Social History   Tobacco Use  . Smoking status: Never Smoker  . Smokeless tobacco: Never Used  Substance Use Topics  . Alcohol use: No  . Drug use: No   Current Medications and Allergies:   .  LORazepam (ATIVAN) 0.5 MG tablet, Take 1 tablet (0.5 mg total) by mouth at bedtime as needed for anxiety (qhs prn)., Disp: 30 tablet, Rfl: 0   Allergies  Allergen Reactions  . Codeine Itching   Review of Systems   Pertinent items are noted in the HPI. Otherwise, ROS is negative.  Vitals:   Vitals:   08/27/18 1101  BP: 122/74  Pulse: 70  Temp: 98.4 F (36.9 C)  TempSrc: Oral  SpO2: 97%  Weight: 257 lb 9.6 oz (116.8 kg)  Height: 5' 2.5" (1.588 m)     Body mass index is 46.36 kg/m.  Physical Exam:   Physical Exam  Constitutional: She appears well-nourished.  HENT:  Head: Normocephalic and  atraumatic.  Eyes: Pupils are equal, round, and reactive to light. EOM are normal.  Neck: Normal range of motion. Neck supple.  Cardiovascular: Normal rate, regular rhythm, normal heart sounds and intact distal pulses.  Pulmonary/Chest: Effort normal.  Abdominal: Soft.  Skin: Skin is warm.  Psychiatric: She has a normal mood and affect. Her behavior is normal.  Nursing note and vitals reviewed.  Assessment and Plan:   [name] was seen today for urinary frequency.  Diagnoses and all orders for this visit:  Urinary frequency Comments: With polyuria. No dysuria, incontinence, vaginal discharge.  Orders: -     POCT urinalysis dipstick  SOB (shortness of breath) -     triamterene-hydrochlorothiazide (MAXZIDE-25) 37.5-25 MG tablet; Take 1 tablet by mouth daily.  Paresthesia -     Hemoglobin A1c -     Vitamin B12 -     Comprehensive metabolic panel  Insulin resistance -     metFORMIN (GLUCOPHAGE XR) 750 MG 24 hr tablet; Take 1 tablet (750 mg total) by mouth 2 (two) times daily.  Morbid obesity (HCC)    . Reviewed expectations re: course of current medical issues. . Discussed self-management of symptoms. . Outlined signs and symptoms indicating need for more acute intervention. . Patient verbalized understanding and all questions were answered. Marland Kitchen Health Maintenance issues including appropriate healthy diet, exercise,  and smoking avoidance were discussed with patient. . See orders for this visit as documented in the electronic medical record. . Patient received an After Visit Summary.   Helane Rima, DO [place] 09/04/2018

## 2018-08-27 ENCOUNTER — Ambulatory Visit: Payer: BLUE CROSS/BLUE SHIELD | Admitting: Family Medicine

## 2018-08-27 ENCOUNTER — Encounter: Payer: Self-pay | Admitting: Family Medicine

## 2018-08-27 VITALS — BP 122/74 | HR 70 | Temp 98.4°F | Ht 62.5 in | Wt 257.6 lb

## 2018-08-27 DIAGNOSIS — E88819 Insulin resistance, unspecified: Secondary | ICD-10-CM

## 2018-08-27 DIAGNOSIS — E8881 Metabolic syndrome: Secondary | ICD-10-CM | POA: Diagnosis not present

## 2018-08-27 DIAGNOSIS — R202 Paresthesia of skin: Secondary | ICD-10-CM | POA: Diagnosis not present

## 2018-08-27 DIAGNOSIS — R300 Dysuria: Secondary | ICD-10-CM | POA: Diagnosis not present

## 2018-08-27 DIAGNOSIS — R0602 Shortness of breath: Secondary | ICD-10-CM

## 2018-08-27 DIAGNOSIS — R350 Frequency of micturition: Secondary | ICD-10-CM | POA: Diagnosis not present

## 2018-08-27 LAB — POCT URINALYSIS DIPSTICK
Bilirubin, UA: NEGATIVE
Blood, UA: NEGATIVE
Glucose, UA: NEGATIVE
Ketones, UA: NEGATIVE
Leukocytes, UA: NEGATIVE
Nitrite, UA: NEGATIVE
Protein, UA: NEGATIVE
Spec Grav, UA: 1.025 (ref 1.010–1.025)
Urobilinogen, UA: 0.2 E.U./dL
pH, UA: 6 (ref 5.0–8.0)

## 2018-08-27 LAB — COMPREHENSIVE METABOLIC PANEL
ALT: 15 U/L (ref 0–35)
AST: 14 U/L (ref 0–37)
Albumin: 3.9 g/dL (ref 3.5–5.2)
Alkaline Phosphatase: 63 U/L (ref 39–117)
BUN: 16 mg/dL (ref 6–23)
CO2: 32 mEq/L (ref 19–32)
Calcium: 9.5 mg/dL (ref 8.4–10.5)
Chloride: 104 mEq/L (ref 96–112)
Creatinine, Ser: 0.57 mg/dL (ref 0.40–1.20)
GFR: 117.38 mL/min (ref >60.00)
Glucose, Bld: 96 mg/dL (ref 70–99)
Potassium: 4.1 mEq/L (ref 3.5–5.1)
Sodium: 140 mEq/L (ref 135–145)
Total Bilirubin: 0.4 mg/dL (ref 0.2–1.2)
Total Protein: 7.2 g/dL (ref 6.0–8.3)

## 2018-08-27 LAB — HEMOGLOBIN A1C: Hgb A1c MFr Bld: 6.1 % (ref 4.6–6.5)

## 2018-08-27 LAB — VITAMIN B12: Vitamin B-12: 219 pg/mL (ref 211–911)

## 2018-08-27 MED ORDER — TRIAMTERENE-HCTZ 37.5-25 MG PO TABS: 1.0000 | ORAL_TABLET | Freq: Every day | ORAL | 3 refills | Status: DC

## 2018-08-29 MED ORDER — METFORMIN HCL ER 750 MG PO TB24: 750.0000 mg | ORAL_TABLET | Freq: Two times a day (BID) | ORAL | 3 refills | Status: DC

## 2018-09-04 ENCOUNTER — Encounter: Payer: Self-pay | Admitting: Family Medicine

## 2018-09-14 ENCOUNTER — Other Ambulatory Visit: Payer: Self-pay | Admitting: Family Medicine

## 2018-09-21 DIAGNOSIS — J019 Acute sinusitis, unspecified: Secondary | ICD-10-CM | POA: Diagnosis not present

## 2018-09-21 DIAGNOSIS — B9689 Other specified bacterial agents as the cause of diseases classified elsewhere: Secondary | ICD-10-CM | POA: Diagnosis not present

## 2018-11-24 ENCOUNTER — Other Ambulatory Visit: Payer: Self-pay | Admitting: Family Medicine

## 2018-11-24 DIAGNOSIS — E8881 Metabolic syndrome: Secondary | ICD-10-CM

## 2018-12-07 DIAGNOSIS — R300 Dysuria: Secondary | ICD-10-CM | POA: Diagnosis not present

## 2018-12-07 DIAGNOSIS — N3001 Acute cystitis with hematuria: Secondary | ICD-10-CM | POA: Diagnosis not present

## 2018-12-07 DIAGNOSIS — L309 Dermatitis, unspecified: Secondary | ICD-10-CM | POA: Diagnosis not present

## 2018-12-13 ENCOUNTER — Other Ambulatory Visit: Payer: Self-pay | Admitting: Family Medicine

## 2018-12-15 DIAGNOSIS — M545 Low back pain: Secondary | ICD-10-CM | POA: Diagnosis not present

## 2018-12-19 DIAGNOSIS — H6122 Impacted cerumen, left ear: Secondary | ICD-10-CM | POA: Diagnosis not present

## 2018-12-19 DIAGNOSIS — L309 Dermatitis, unspecified: Secondary | ICD-10-CM | POA: Diagnosis not present

## 2018-12-19 DIAGNOSIS — H938X3 Other specified disorders of ear, bilateral: Secondary | ICD-10-CM | POA: Diagnosis not present

## 2018-12-22 ENCOUNTER — Emergency Department (HOSPITAL_BASED_OUTPATIENT_CLINIC_OR_DEPARTMENT_OTHER): Payer: BLUE CROSS/BLUE SHIELD

## 2018-12-22 ENCOUNTER — Encounter (HOSPITAL_BASED_OUTPATIENT_CLINIC_OR_DEPARTMENT_OTHER): Payer: Self-pay

## 2018-12-22 ENCOUNTER — Other Ambulatory Visit: Payer: Self-pay

## 2018-12-22 ENCOUNTER — Ambulatory Visit: Payer: Self-pay

## 2018-12-22 ENCOUNTER — Emergency Department (HOSPITAL_BASED_OUTPATIENT_CLINIC_OR_DEPARTMENT_OTHER)
Admission: EM | Admit: 2018-12-22 | Discharge: 2018-12-22 | Disposition: A | Discharge status: Home / Self Care | Payer: BLUE CROSS/BLUE SHIELD | Attending: Emergency Medicine | Admitting: Emergency Medicine

## 2018-12-22 DIAGNOSIS — R202 Paresthesia of skin: Secondary | ICD-10-CM | POA: Diagnosis not present

## 2018-12-22 DIAGNOSIS — R030 Elevated blood-pressure reading, without diagnosis of hypertension: Secondary | ICD-10-CM | POA: Diagnosis not present

## 2018-12-22 DIAGNOSIS — R200 Anesthesia of skin: Secondary | ICD-10-CM | POA: Diagnosis not present

## 2018-12-22 LAB — COMPREHENSIVE METABOLIC PANEL
ALT: 18 U/L (ref 0–44)
AST: 13 U/L — ABNORMAL LOW (ref 15–41)
Albumin: 3.2 g/dL — ABNORMAL LOW (ref 3.5–5.0)
Alkaline Phosphatase: 83 U/L (ref 38–126)
Anion gap: 5 (ref 5–15)
BUN: 24 mg/dL — ABNORMAL HIGH (ref 6–20)
CO2: 27 mmol/L (ref 22–32)
Calcium: 8.5 mg/dL — ABNORMAL LOW (ref 8.9–10.3)
Chloride: 104 mmol/L (ref 98–111)
Creatinine, Ser: 0.67 mg/dL (ref 0.44–1.00)
GFR calc Af Amer: >60 mL/min (ref >60)
GFR calc non Af Amer: >60 mL/min (ref >60)
Glucose, Bld: 138 mg/dL — ABNORMAL HIGH (ref 70–99)
Potassium: 3.8 mmol/L (ref 3.5–5.1)
Sodium: 136 mmol/L (ref 135–145)
Total Bilirubin: 0.4 mg/dL (ref 0.3–1.2)
Total Protein: 6.3 g/dL — ABNORMAL LOW (ref 6.5–8.1)

## 2018-12-22 LAB — DIFFERENTIAL
Abs Immature Granulocytes: 0.07 10*3/uL (ref 0.00–0.07)
Basophils Absolute: 0 10*3/uL (ref 0.0–0.1)
Basophils Relative: 0 %
Eosinophils Absolute: 0.1 10*3/uL (ref 0.0–0.5)
Eosinophils Relative: 1 %
Immature Granulocytes: 1 %
Lymphocytes Relative: 24 %
Lymphs Abs: 2.4 10*3/uL (ref 0.7–4.0)
Monocytes Absolute: 0.7 10*3/uL (ref 0.1–1.0)
Monocytes Relative: 7 %
Neutro Abs: 6.9 10*3/uL (ref 1.7–7.7)
Neutrophils Relative %: 67 %

## 2018-12-22 LAB — CBC
HCT: 39.2 % (ref 36.0–46.0)
Hemoglobin: 12.2 g/dL (ref 12.0–15.0)
MCH: 29.4 pg (ref 26.0–34.0)
MCHC: 31.1 g/dL (ref 30.0–36.0)
MCV: 94.5 fL (ref 80.0–100.0)
Platelets: 192 10*3/uL (ref 150–400)
RBC: 4.15 MIL/uL (ref 3.87–5.11)
RDW: 13.2 % (ref 11.5–15.5)
WBC: 10.1 10*3/uL (ref 4.0–10.5)
nRBC: 0 % (ref 0.0–0.2)

## 2018-12-22 LAB — URINALYSIS, ROUTINE W REFLEX MICROSCOPIC
Bilirubin Urine: NEGATIVE
Glucose, UA: NEGATIVE mg/dL
Hgb urine dipstick: NEGATIVE
Ketones, ur: NEGATIVE mg/dL
Leukocytes, UA: NEGATIVE
Nitrite: NEGATIVE
Protein, ur: NEGATIVE mg/dL
Specific Gravity, Urine: 1.025 (ref 1.005–1.030)
pH: 6 (ref 5.0–8.0)

## 2018-12-22 LAB — PROTIME-INR
INR: 0.97
Prothrombin Time: 12.8 seconds (ref 11.4–15.2)

## 2018-12-22 LAB — ETHANOL: Alcohol, Ethyl (B): <10 mg/dL (ref <10)

## 2018-12-22 LAB — APTT: aPTT: 24 seconds (ref 24–36)

## 2018-12-22 LAB — RAPID URINE DRUG SCREEN, HOSP PERFORMED
Amphetamines: NOT DETECTED
Barbiturates: NOT DETECTED
Benzodiazepines: NOT DETECTED
Cocaine: NOT DETECTED
Opiates: NOT DETECTED
Tetrahydrocannabinol: NOT DETECTED

## 2018-12-22 LAB — PREGNANCY, URINE: Preg Test, Ur: NEGATIVE

## 2018-12-22 LAB — TROPONIN I: Troponin I: <0.03 ng/mL (ref <0.03)

## 2018-12-22 MED ORDER — LACTATED RINGERS IV BOLUS
1000.0000 mL | Freq: Once | INTRAVENOUS | Status: AC
  Administered 2018-12-22: 1000 mL via INTRAVENOUS
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
## 2018-12-22 NOTE — ED Notes (Signed)
Pt was at work around 1500 and started having bilateral leg cramps,  Was breathing fast and having numbness to left side of head and face and lips,  At present states alittle numbness to lips,  Denies pain

## 2018-12-22 NOTE — Telephone Encounter (Signed)
Patient called in with c/o "numbness to face." She says "about 2 hours ago, I had pain and stiffness to both of my legs. They felt like a cramp or spasms and I couldn't stand up. I rubbed them for about 45 seconds to 1 minute and then I was able to stand. I stood there for a minute or so and I started having numbness to my buttocks and lower back, then the numbness went up to my lips and the left side of my face. This lasted for about 30 seconds. Now I feel tingling to my lips and face and my fingers of my left hand, feeling like something is crawling, but no pain." I asked about other symptoms, she denies. I asked about weakness on one side of the body, she says "not really weakness." According to protocol, go to ED, care advice given, patient verbalized understanding.   Reason for Disposition . [1] Numbness (i.e., loss of sensation) of the face, arm / hand, or leg / foot on one side of the body AND [2] sudden onset AND [3] brief (now gone)  Answer Assessment - Initial Assessment Questions 1. SYMPTOM: "What is the main symptom you are concerned about?" (e.g., weakness, numbness)     Numbness to lips and left side of face 2. ONSET: "When did this start?" (minutes, hours, days; while sleeping)     2 hours ago, lasting about 30 seconds 3. LAST NORMAL: "When was the last time you were normal (no symptoms)?"     Prior to 2 hours ago 4. PATTERN "Does this come and go, or has it been constant since it started?"  "Is it present now?"     It went away after 30 seconds, then went away and tingling to the left side of face and mouth right now 5. CARDIAC SYMPTOMS: "Have you had any of the following symptoms: chest pain, difficulty breathing, palpitations?"     No 6. NEUROLOGIC SYMPTOMS: "Have you had any of the following symptoms: headache, dizziness, vision loss, double vision, changes in speech, unsteady on your feet?"     Finger tips on left hand tingling 7. OTHER SYMPTOMS: "Do you have any other  symptoms?"     No 8. PREGNANCY: "Is there any chance you are pregnant?" "When was your last menstrual period?"     No  Protocols used: NEUROLOGIC DEFICIT-A-AH

## 2018-12-22 NOTE — Telephone Encounter (Signed)
See note

## 2018-12-22 NOTE — ED Triage Notes (Addendum)
Pt states she was seated at work ~330pm-both legs "felt like a charlie horse and I couldn't stand" ~1-2 mins- numbness/tingling to left side of face, top of head and left UE and visual change to left eye that continues-NAD-steady gait

## 2018-12-22 NOTE — ED Notes (Signed)
Patient ambulated  to restroom without assistance.

## 2018-12-23 NOTE — Telephone Encounter (Signed)
JYI

## 2018-12-23 NOTE — Telephone Encounter (Signed)
FYI patient went to ED 

## 2018-12-30 ENCOUNTER — Other Ambulatory Visit: Payer: Self-pay | Admitting: Sports Medicine

## 2018-12-30 DIAGNOSIS — M545 Low back pain, unspecified: Secondary | ICD-10-CM

## 2019-01-02 ENCOUNTER — Other Ambulatory Visit: Payer: BLUE CROSS/BLUE SHIELD

## 2019-01-03 ENCOUNTER — Ambulatory Visit
Admission: RE | Admit: 2019-01-03 | Discharge: 2019-01-03 | Disposition: A | Discharge status: Home / Self Care | Payer: BLUE CROSS/BLUE SHIELD | Source: Ambulatory Visit | Attending: Sports Medicine | Admitting: Sports Medicine

## 2019-01-03 DIAGNOSIS — M545 Low back pain, unspecified: Secondary | ICD-10-CM

## 2019-01-05 DIAGNOSIS — M545 Low back pain: Secondary | ICD-10-CM | POA: Diagnosis not present

## 2019-01-13 ENCOUNTER — Telehealth: Payer: Self-pay | Admitting: Radiology

## 2019-01-13 NOTE — Telephone Encounter (Signed)
See Note   Copied from CRM #[id number]. Topic: Appointment Scheduling - Scheduling Inquiry for Clinic >> Jan 13, 2019  4:13 PM Arlyss Gandy, NT wrote: Reason for CRM: Pt would like to see if she can be worked in with Dr. Earlene Plater for tomorrow to discuss her back pain and injections she is scheduled for on Friday. Please advise.

## 2019-01-14 NOTE — Telephone Encounter (Signed)
Called patient answered questions and made app for f/u for next week.

## 2019-01-16 DIAGNOSIS — M545 Low back pain: Secondary | ICD-10-CM | POA: Diagnosis not present

## 2019-01-16 DIAGNOSIS — M4316 Spondylolisthesis, lumbar region: Secondary | ICD-10-CM | POA: Diagnosis not present

## 2019-01-16 DIAGNOSIS — M5116 Intervertebral disc disorders with radiculopathy, lumbar region: Secondary | ICD-10-CM | POA: Diagnosis not present

## 2019-01-20 ENCOUNTER — Encounter: Payer: Self-pay | Admitting: Family Medicine

## 2019-01-20 ENCOUNTER — Ambulatory Visit: Payer: BLUE CROSS/BLUE SHIELD | Admitting: Family Medicine

## 2019-01-20 VITALS — BP 124/68 | HR 96 | Temp 98.6°F | Ht 62.5 in | Wt 271.8 lb

## 2019-01-20 DIAGNOSIS — R0602 Shortness of breath: Secondary | ICD-10-CM

## 2019-01-20 DIAGNOSIS — R5383 Other fatigue: Secondary | ICD-10-CM | POA: Diagnosis not present

## 2019-01-20 DIAGNOSIS — E7800 Pure hypercholesterolemia, unspecified: Secondary | ICD-10-CM | POA: Diagnosis not present

## 2019-01-20 DIAGNOSIS — E8881 Metabolic syndrome: Secondary | ICD-10-CM

## 2019-01-20 DIAGNOSIS — E88819 Insulin resistance, unspecified: Secondary | ICD-10-CM

## 2019-01-20 LAB — CBC WITH DIFFERENTIAL/PLATELET
Basophils Absolute: 0 10*3/uL (ref 0.0–0.1)
Basophils Relative: 0.4 % (ref 0.0–3.0)
Eosinophils Absolute: 0.1 10*3/uL (ref 0.0–0.7)
Eosinophils Relative: 2.4 % (ref 0.0–5.0)
HCT: 38.7 % (ref 36.0–46.0)
Hemoglobin: 12.8 g/dL (ref 12.0–15.0)
Lymphocytes Relative: 30.8 % (ref 12.0–46.0)
Lymphs Abs: 1.8 10*3/uL (ref 0.7–4.0)
MCHC: 33.2 g/dL (ref 30.0–36.0)
MCV: 90.3 fl (ref 78.0–100.0)
Monocytes Absolute: 0.4 10*3/uL (ref 0.1–1.0)
Monocytes Relative: 6.4 % (ref 3.0–12.0)
Neutro Abs: 3.5 10*3/uL (ref 1.4–7.7)
Neutrophils Relative %: 60 % (ref 43.0–77.0)
Platelets: 222 10*3/uL (ref 150.0–400.0)
RBC: 4.28 Mil/uL (ref 3.87–5.11)
RDW: 14 % (ref 11.5–15.5)
WBC: 5.9 10*3/uL (ref 4.0–10.5)

## 2019-01-20 LAB — COMPREHENSIVE METABOLIC PANEL
ALT: 16 U/L (ref 0–35)
AST: 11 U/L (ref 0–37)
Albumin: 3.8 g/dL (ref 3.5–5.2)
Alkaline Phosphatase: 81 U/L (ref 39–117)
BUN: 17 mg/dL (ref 6–23)
CO2: 27 mEq/L (ref 19–32)
Calcium: 9 mg/dL (ref 8.4–10.5)
Chloride: 104 mEq/L (ref 96–112)
Creatinine, Ser: 0.51 mg/dL (ref 0.40–1.20)
GFR: 125.37 mL/min (ref >60.00)
Glucose, Bld: 126 mg/dL — ABNORMAL HIGH (ref 70–99)
Potassium: 4.1 mEq/L (ref 3.5–5.1)
Sodium: 139 mEq/L (ref 135–145)
Total Bilirubin: 0.4 mg/dL (ref 0.2–1.2)
Total Protein: 6.2 g/dL (ref 6.0–8.3)

## 2019-01-20 LAB — TSH: TSH: 3.23 u[IU]/mL (ref 0.35–4.50)

## 2019-01-20 LAB — LIPID PANEL
Cholesterol: 204 mg/dL — ABNORMAL HIGH (ref 0–200)
HDL: 48.2 mg/dL (ref >39.00)
NonHDL: 156.06
Total CHOL/HDL Ratio: 4
Triglycerides: 213 mg/dL — ABNORMAL HIGH (ref 0.0–149.0)
VLDL: 42.6 mg/dL — ABNORMAL HIGH (ref 0.0–40.0)

## 2019-01-20 LAB — BRAIN NATRIURETIC PEPTIDE: Pro B Natriuretic peptide (BNP): 26 pg/mL (ref 0.0–100.0)

## 2019-01-20 LAB — VITAMIN B12: Vitamin B-12: 269 pg/mL (ref 211–911)

## 2019-01-20 LAB — LDL CHOLESTEROL, DIRECT: Direct LDL: 130 mg/dL

## 2019-01-20 LAB — HEMOGLOBIN A1C: Hgb A1c MFr Bld: 6.2 % (ref 4.6–6.5)

## 2019-01-20 NOTE — Progress Notes (Signed)
Mikayla [name] is a 53 y.o. female is here for follow up.  History of Present Illness:   HPI: Patient presents today for weight gain and shortness of breath.  She was previously taking metformin for insulin resistance and Maxide for edema/hypertension.  She states that she went to the emergency room a few months ago and was told that she was dehydrated.  She originally went for weakness, worried that she had a stroke.  She says that she was humiliated by the way that she was treated.  Since then she has stopped all medications.  Her weight is up around 20 pounds.  She is very tired.  She does have shortness of breath with walking.  Denies chest pain, nausea, vomiting, headaches, dizziness.  There are no preventive care reminders to display for this patient.   Depression screen [facility] 2/9 01/20/2019 11/02/2016 10/09/2016  Decreased Interest 1 0 0  Down, Depressed, Hopeless 0 0 0  PHQ - 2 Score 1 0 0  Altered sleeping 2 0 0  Tired, decreased energy 3 0 0  Change in appetite 2 0 0  Feeling bad or failure about yourself  0 0 0  Trouble concentrating 0 0 0  Moving slowly or fidgety/restless 0 0 0  Suicidal thoughts 0 0 0  PHQ-9 Score 8 0 0  Difficult doing work/chores Somewhat difficult - -   PMHx, SurgHx, SocialHx, FamHx, Medications, and Allergies were reviewed in the Visit Navigator and updated as appropriate.   Patient Active Problem List   Diagnosis Date Noted  . Insulin resistance 02/19/2017  . Hyperlipidemia   . Constipation 04/12/2015  . 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 Vitamin D deficiency 08/13/2012  . Depression 11/27/2011  . Morbid obesity (HCC) 04/11/2010  . GERD 02/10/2009   Social History   Tobacco Use  . Smoking status: Never Smoker  . Smokeless tobacco: Never Used  Substance Use Topics  . Alcohol use: No  . Drug use: No   Current Medications and Allergies   Current Outpatient Medications:  .  furosemide (LASIX) 20 MG  tablet, TAKE 1 TABLET (20 MG TOTAL) BY MOUTH AS NEEDED. (Patient not taking: Reported on 01/20/2019), Disp: 90 tablet, Rfl: 0 .  LORazepam (ATIVAN) 0.5 MG tablet, Take 1 tablet (0.5 mg total) by mouth at bedtime as needed for anxiety (qhs prn). (Patient not taking: Reported on 01/20/2019), Disp: 30 tablet, Rfl: 0 .  metFORMIN (GLUCOPHAGE-XR) 750 MG 24 hr tablet, TAKE 1 TABLET (750 MG TOTAL) BY MOUTH 2 (TWO) TIMES DAILY. (Patient not taking: Reported on 01/20/2019), Disp: 180 tablet, Rfl: 1 .  triamterene-hydrochlorothiazide (MAXZIDE-25) 37.5-25 MG tablet, Take 1 tablet by mouth daily. (Patient not taking: Reported on 01/20/2019), Disp: 90 tablet, Rfl: 3   Allergies  Allergen Reactions  . Codeine Itching   Review of Systems   Pertinent items are noted in the HPI. Otherwise, a complete ROS is negative.  Vitals   Vitals:   01/20/19 1126  BP: 124/68  Pulse: 96  Temp: 98.6 F (37 C)  TempSrc: Oral  SpO2: 96%  Weight: 271 lb 12.8 oz (123.3 kg)  Height: 5' 2.5" (1.588 m)     Body mass index is 48.92 kg/m.  Physical Exam   Physical Exam Vitals signs and nursing note reviewed.  Constitutional:      General: She is not in acute distress.    Appearance: Normal appearance. She is obese.  HENT:     Head: Normocephalic  and atraumatic.     Nose: Nose normal.     Mouth/Throat:     Mouth: Mucous membranes are moist.  Eyes:     Pupils: Pupils are equal, round, and reactive to light.  Neck:     Musculoskeletal: Normal range of motion and neck supple.  Cardiovascular:     Rate and Rhythm: Normal rate and regular rhythm.     Heart sounds: Normal heart sounds.  Pulmonary:     Effort: Pulmonary effort is normal. No respiratory distress.     Breath sounds: No wheezing or rhonchi.  Abdominal:     Palpations: Abdomen is soft.  Musculoskeletal:     Right lower leg: Edema present.     Left lower leg: Edema present.  Skin:    General: Skin is warm.     Capillary Refill: Capillary refill takes  less than 2 seconds.  Neurological:     General: No focal deficit present.     Mental Status: She is alert.  Psychiatric:        Behavior: Behavior normal.    Assessment and Plan   Bryssa was seen today for follow-up.  Diagnoses and all orders for this visit:  SOB (shortness of breath) -     CBC with Differential/Platelet -     Comprehensive metabolic panel -     Iron, TIBC and Ferritin Panel -     TSH -     Brain natriuretic peptide  Pure hypercholesterolemia -     Comprehensive metabolic panel -     Lipid panel -     LDL cholesterol, direct  Insulin resistance -     Hemoglobin A1c  Fatigue, unspecified type -     CBC with Differential/Platelet -     Comprehensive metabolic panel -     Iron, TIBC and Ferritin Panel -     TSH -     Vitamin B12   . Orders and follow up as documented in EpicCare, reviewed diet, exercise and weight control, cardiovascular risk and specific lipid/LDL goals reviewed, reviewed medications and side effects in detail.  . Reviewed expectations re: course of current medical issues. . Outlined signs and symptoms indicating need for more acute intervention. . Patient verbalized understanding and all questions were answered. . Patient received an After Visit Summary.   Helane Rima, DO [place], [facility] 01/21/2019

## 2019-01-21 LAB — IRON,TIBC AND FERRITIN PANEL
%SAT: 28 % (calc) (ref 16–45)
Ferritin: 109 ng/mL (ref 16–232)
Iron: 89 ug/dL (ref 45–160)
TIBC: 315 mcg/dL (calc) (ref 250–450)

## 2019-01-27 DIAGNOSIS — M545 Low back pain: Secondary | ICD-10-CM | POA: Diagnosis not present

## 2019-02-03 ENCOUNTER — Telehealth: Payer: Self-pay | Admitting: Family Medicine

## 2019-02-03 ENCOUNTER — Telehealth: Payer: BLUE CROSS/BLUE SHIELD | Admitting: Nurse Practitioner

## 2019-02-03 DIAGNOSIS — J029 Acute pharyngitis, unspecified: Secondary | ICD-10-CM

## 2019-02-03 NOTE — Telephone Encounter (Signed)
See note

## 2019-02-03 NOTE — Telephone Encounter (Signed)
Copied from CRM [id number]. Topic: Quick Communication - See Telephone Encounter >> Feb 03, 2019  1:42 PM Maia Petties wrote: CRM for notification. See Telephone encounter for: 02/03/19. Pt called stating left ear is hurting and she has a sore throat for about 3 days. Some chills. Pt denies other symptoms. No fever. Pt requesting ABX from Dr. Earlene Plater. Last OV 01/20/2019. Pt doesn't want to come in due to having 2 small babies.  [id number] [place], [place] - [address] [phone] (Phone) [phone] (Fax)

## 2019-02-03 NOTE — Progress Notes (Signed)
We are sorry that you are not feeling well.  Here is how we plan to help!  Your symptoms indicate a likely viral infection (Pharyngitis).   Pharyngitis is inflammation in the back of the throat which can cause a sore throat, scratchiness and sometimes difficulty swallowing.   Pharyngitis is typically caused by a respiratory virus and will just run its course.  Please keep in mind that your symptoms could last up to 10 days.  For throat pain, we recommend over the counter oral pain relief medications such as acetaminophen or aspirin, or anti-inflammatory medications such as ibuprofen or naproxen sodium.  Topical treatments such as oral throat lozenges or sprays may be used as needed.  Avoid close contact with loved ones, especially the very young and elderly.  Remember to wash your hands thoroughly throughout the day as this is the number one way to prevent the spread of infection and wipe down door knobs and counters with disinfectant.  After careful review of your answers, I would not recommend and antibiotic for your condition.  Antibiotics should not be used to treat conditions that we suspect are caused by viruses like the virus that causes the common cold or flu. However, some people can have Strep with atypical symptoms. You may need formal testing in clinic or office to confirm if your symptoms continue or worsen.  Providers prescribe antibiotics to treat infections caused by bacteria. Antibiotics are very powerful in treating bacterial infections when they are used properly.  To maintain their effectiveness, they should be used only when necessary.  Overuse of antibiotics has resulted in the development of super bugs that are resistant to treatment!    Home Care:  Only take medications as instructed by your medical team.  Do not drink alcohol while taking these medications.  A steam or ultrasonic humidifier can help congestion.  You can place a towel over your head and breathe in the steam from  hot water coming from a faucet.  Avoid close contacts especially the very young and the elderly.  Cover your mouth when you cough or sneeze.  Always remember to wash your hands.  Get Help Right Away If:  You develop worsening fever or throat pain.  You develop a severe head ache or visual changes.  Your symptoms persist after you have completed your treatment plan.  Make sure you  Understand these instructions.  Will watch your condition.  Will get help right away if you are not doing well or get worse.  Your e-visit answers were reviewed by a board certified advanced clinical practitioner to complete your personal care plan.  Depending on the condition, your plan could have included both over the counter or prescription medications.  If there is a problem please reply  once you have received a response from your provider.  Your safety is important to us.  If you have drug allergies check your prescription carefully.    You can use MyChart to ask questions about todays visit, request a non-urgent call back, or ask for a work or school excuse for 24 hours related to this e-Visit. If it has been greater than 24 hours you will need to follow up with your provider, or enter a new e-Visit to address those concerns.  You will get an e-mail in the next two days asking about your experience.  I hope that your e-visit has been valuable and will speed your recovery. Thank you for using e-visits.   5 minutes spent reviewing   and documenting in chart.  

## 2019-02-03 NOTE — Telephone Encounter (Signed)
Called patient informed she would need to be evaluated before any abx can be given. Gave contact information for [facility] for e visit. She will call if any questions.

## 2019-05-11 ENCOUNTER — Encounter: Payer: Self-pay | Admitting: *Deleted

## 2019-05-11 DIAGNOSIS — M5136 Other intervertebral disc degeneration, lumbar region: Secondary | ICD-10-CM | POA: Insufficient documentation

## 2019-05-11 DIAGNOSIS — M51369 Other intervertebral disc degeneration, lumbar region without mention of lumbar back pain or lower extremity pain: Secondary | ICD-10-CM | POA: Insufficient documentation

## 2019-06-13 DIAGNOSIS — Z20828 Contact with and (suspected) exposure to other viral communicable diseases: Secondary | ICD-10-CM | POA: Diagnosis not present

## 2019-06-29 ENCOUNTER — Other Ambulatory Visit: Payer: Self-pay | Admitting: Internal Medicine

## 2019-06-29 ENCOUNTER — Other Ambulatory Visit: Payer: Self-pay

## 2019-06-29 DIAGNOSIS — J069 Acute upper respiratory infection, unspecified: Secondary | ICD-10-CM

## 2019-06-29 DIAGNOSIS — Z20822 Contact with and (suspected) exposure to covid-19: Secondary | ICD-10-CM

## 2019-06-30 LAB — NOVEL CORONAVIRUS, NAA: SARS-CoV-2, NAA: NOT DETECTED

## 2019-07-01 ENCOUNTER — Telehealth: Payer: Self-pay

## 2019-07-01 NOTE — Telephone Encounter (Signed)
Pt has been informed of results and expressed understanding.  °

## 2019-07-01 NOTE — Telephone Encounter (Signed)
-----   Message from Biagio Borg, MD sent at 06/30/2019  7:39 PM EDT ----- Left message on MyChart, pt to cont same tx  Shirron to please inform pt, COVID is neg

## 2019-07-22 DIAGNOSIS — M545 Low back pain: Secondary | ICD-10-CM | POA: Diagnosis not present

## 2019-07-24 DIAGNOSIS — H524 Presbyopia: Secondary | ICD-10-CM | POA: Diagnosis not present

## 2019-08-14 ENCOUNTER — Telehealth: Payer: Self-pay | Admitting: *Deleted

## 2019-08-14 NOTE — Telephone Encounter (Signed)
Okay 

## 2019-08-14 NOTE — Telephone Encounter (Signed)
Patient would like to transfer Care from Dr Juleen China to Elyn Aquas, PA-C.

## 2019-08-14 NOTE — Telephone Encounter (Signed)
Ok with me 

## 2019-08-18 ENCOUNTER — Encounter: Payer: BLUE CROSS/BLUE SHIELD | Admitting: Family Medicine

## 2019-08-19 ENCOUNTER — Ambulatory Visit (INDEPENDENT_AMBULATORY_CARE_PROVIDER_SITE_OTHER): Payer: BC Managed Care – PPO | Admitting: Physician Assistant

## 2019-08-19 ENCOUNTER — Other Ambulatory Visit: Payer: Self-pay

## 2019-08-19 ENCOUNTER — Encounter: Payer: Self-pay | Admitting: Physician Assistant

## 2019-08-19 VITALS — BP 100/78 | HR 78 | Temp 97.8°F | Resp 16 | Ht 62.0 in | Wt 245.0 lb

## 2019-08-19 DIAGNOSIS — Z0001 Encounter for general adult medical examination with abnormal findings: Secondary | ICD-10-CM | POA: Diagnosis not present

## 2019-08-19 DIAGNOSIS — H60391 Other infective otitis externa, right ear: Secondary | ICD-10-CM | POA: Diagnosis not present

## 2019-08-19 DIAGNOSIS — Z23 Encounter for immunization: Secondary | ICD-10-CM

## 2019-08-19 DIAGNOSIS — Z0000 Encounter for general adult medical examination without abnormal findings: Secondary | ICD-10-CM | POA: Diagnosis not present

## 2019-08-19 LAB — COMPREHENSIVE METABOLIC PANEL
ALT: 16 U/L (ref 0–35)
AST: 12 U/L (ref 0–37)
Albumin: 4.1 g/dL (ref 3.5–5.2)
Alkaline Phosphatase: 79 U/L (ref 39–117)
BUN: 15 mg/dL (ref 6–23)
CO2: 29 mEq/L (ref 19–32)
Calcium: 9.6 mg/dL (ref 8.4–10.5)
Chloride: 104 mEq/L (ref 96–112)
Creatinine, Ser: 0.56 mg/dL (ref 0.40–1.20)
GFR: 112.31 mL/min (ref >60.00)
Glucose, Bld: 85 mg/dL (ref 70–99)
Potassium: 4.5 mEq/L (ref 3.5–5.1)
Sodium: 140 mEq/L (ref 135–145)
Total Bilirubin: 0.4 mg/dL (ref 0.2–1.2)
Total Protein: 6.8 g/dL (ref 6.0–8.3)

## 2019-08-19 LAB — TSH: TSH: 2.41 u[IU]/mL (ref 0.35–4.50)

## 2019-08-19 LAB — CBC WITH DIFFERENTIAL/PLATELET
Basophils Absolute: 0 10*3/uL (ref 0.0–0.1)
Basophils Relative: 0.2 % (ref 0.0–3.0)
Eosinophils Absolute: 0.1 10*3/uL (ref 0.0–0.7)
Eosinophils Relative: 2.3 % (ref 0.0–5.0)
HCT: 39.4 % (ref 36.0–46.0)
Hemoglobin: 13 g/dL (ref 12.0–15.0)
Lymphocytes Relative: 27.1 % (ref 12.0–46.0)
Lymphs Abs: 1.7 10*3/uL (ref 0.7–4.0)
MCHC: 33.1 g/dL (ref 30.0–36.0)
MCV: 90.8 fl (ref 78.0–100.0)
Monocytes Absolute: 0.5 10*3/uL (ref 0.1–1.0)
Monocytes Relative: 8.2 % (ref 3.0–12.0)
Neutro Abs: 3.9 10*3/uL (ref 1.4–7.7)
Neutrophils Relative %: 62.2 % (ref 43.0–77.0)
Platelets: 170 10*3/uL (ref 150.0–400.0)
RBC: 4.33 Mil/uL (ref 3.87–5.11)
RDW: 13.8 % (ref 11.5–15.5)
WBC: 6.2 10*3/uL (ref 4.0–10.5)

## 2019-08-19 LAB — LIPID PANEL
Cholesterol: 233 mg/dL — ABNORMAL HIGH (ref 0–200)
HDL: 54.2 mg/dL (ref >39.00)
LDL Cholesterol: 151 mg/dL — ABNORMAL HIGH (ref 0–99)
NonHDL: 179.27
Total CHOL/HDL Ratio: 4
Triglycerides: 139 mg/dL (ref 0.0–149.0)
VLDL: 27.8 mg/dL (ref 0.0–40.0)

## 2019-08-19 LAB — VITAMIN D 25 HYDROXY (VIT D DEFICIENCY, FRACTURES): VITD: 20.85 ng/mL — ABNORMAL LOW (ref 30.00–100.00)

## 2019-08-19 LAB — HEMOGLOBIN A1C: Hgb A1c MFr Bld: 6 % (ref 4.6–6.5)

## 2019-08-19 MED ORDER — NEOMYCIN-POLYMYXIN-HC 3.5-10000-1 OT SOLN: 3.0000 [drp] | Freq: Four times a day (QID) | OTIC | 0 refills | Status: DC

## 2019-08-19 NOTE — Patient Instructions (Addendum)
Please go to the lab for blood work.   Our office will call you with your results unless you have chosen to receive results via MyChart.  If your blood work is normal we will follow-up each year for physicals and as scheduled for chronic medical problems.  If anything is abnormal we will treat accordingly and get you in for a follow-up.  Please download the Calm app on your phone and try some of these exercises to help with mood and restlessness. Try to get out more and get some sunlight. Keep active. If not improving, let me know.  Use the Cortisporin drops as directed to the right ear for mild external ear infection. Avoid putting anything else in the ear. Recommend you put a cotton ball in the ear when showering to prevent water from getting down in the canal. Get some OTC Flonase nasal spray to use over the next week to help alleviate inner ear pressure.   Rm 7 -- Mr Preventive Care 42-41 Years Old, Female Preventive care refers to visits with your health care provider and lifestyle choices that can promote health and wellness. This includes:  A yearly physical exam. This may also be called an annual well check.  Regular dental visits and eye exams.  Immunizations.  Screening for certain conditions.  Healthy lifestyle choices, such as eating a healthy diet, getting regular exercise, not using drugs or products that contain nicotine and tobacco, and limiting alcohol use. What can I expect for my preventive care visit? Physical exam Your health care provider will check your:  Height and weight. This may be used to calculate body mass index (BMI), which tells if you are at a healthy weight.  Heart rate and blood pressure.  Skin for abnormal spots. Counseling Your health care provider may ask you questions about your:  Alcohol, tobacco, and drug use.  Emotional well-being.  Home and relationship well-being.  Sexual activity.  Eating habits.  Work and work Statistician.   Method of birth control.  Menstrual cycle.  Pregnancy history. What immunizations do I need?  Influenza (flu) vaccine  This is recommended every year. Tetanus, diphtheria, and pertussis (Tdap) vaccine  You may need a Td booster every 10 years. Varicella (chickenpox) vaccine  You may need this if you have not been vaccinated. Zoster (shingles) vaccine  You may need this after age 62. Measles, mumps, and rubella (MMR) vaccine  You may need at least one dose of MMR if you were born in 1957 or later. You may also need a second dose. Pneumococcal conjugate (PCV13) vaccine  You may need this if you have certain conditions and were not previously vaccinated. Pneumococcal polysaccharide (PPSV23) vaccine  You may need one or two doses if you smoke cigarettes or if you have certain conditions. Meningococcal conjugate (MenACWY) vaccine  You may need this if you have certain conditions. Hepatitis A vaccine  You may need this if you have certain conditions or if you travel or work in places where you may be exposed to hepatitis A. Hepatitis B vaccine  You may need this if you have certain conditions or if you travel or work in places where you may be exposed to hepatitis B. Haemophilus influenzae type b (Hib) vaccine  You may need this if you have certain conditions. Human papillomavirus (HPV) vaccine  If recommended by your health care provider, you may need three doses over 6 months. You may receive vaccines as individual doses or as more than one vaccine  together in one shot (combination vaccines). Talk with your health care provider about the risks and benefits of combination vaccines. What tests do I need? Blood tests  Lipid and cholesterol levels. These may be checked every 5 years, or more frequently if you are over 70 years old.  Hepatitis C test.  Hepatitis B test. Screening  Lung cancer screening. You may have this screening every year starting at age 59 if you  have a 30-pack-year history of smoking and currently smoke or have quit within the past 15 years.  Colorectal cancer screening. All adults should have this screening starting at age 78 and continuing until age 22. Your health care provider may recommend screening at age 19 if you are at increased risk. You will have tests every 1-10 years, depending on your results and the type of screening test.  Diabetes screening. This is done by checking your blood sugar (glucose) after you have not eaten for a while (fasting). You may have this done every 1-3 years.  Mammogram. This may be done every 1-2 years. Talk with your health care provider about when you should start having regular mammograms. This may depend on whether you have a family history of breast cancer.  BRCA-related cancer screening. This may be done if you have a family history of breast, ovarian, tubal, or peritoneal cancers.  Pelvic exam and Pap test. This may be done every 3 years starting at age 25. Starting at age 76, this may be done every 5 years if you have a Pap test in combination with an HPV test. Other tests  Sexually transmitted disease (STD) testing.  Bone density scan. This is done to screen for osteoporosis. You may have this scan if you are at high risk for osteoporosis. Follow these instructions at home: Eating and drinking  Eat a diet that includes fresh fruits and vegetables, whole grains, lean protein, and low-fat dairy.  Take vitamin and mineral supplements as recommended by your health care provider.  Do not drink alcohol if: ? Your health care provider tells you not to drink. ? You are pregnant, may be pregnant, or are planning to become pregnant.  If you drink alcohol: ? Limit how much you have to 0-1 drink a day. ? Be aware of how much alcohol is in your drink. In the [place] one drink equals one 12 oz bottle of beer (355 mL), one 5 oz glass of wine (148 mL), or one 1 oz glass of hard liquor (44 mL).  Lifestyle  Take daily care of your teeth and gums.  Stay active. Exercise for at least 30 minutes on 5 or more days each week.  Do not use any products that contain nicotine or tobacco, such as cigarettes, e-cigarettes, and chewing tobacco. If you need help quitting, ask your health care provider.  If you are sexually active, practice safe sex. Use a condom or other form of birth control (contraception) in order to prevent pregnancy and STIs (sexually transmitted infections).  If told by your health care provider, take low-dose aspirin daily starting at age 34. What's next?  Visit your health care provider once a year for a well check visit.  Ask your health care provider how often you should have your eyes and teeth checked.  Stay up to date on all vaccines. This information is not intended to replace advice given to you by your health care provider. Make sure you discuss any questions you have with your health care provider. Document Released:  12/02/2015 Document Revised: 07/17/2018 Document Reviewed: 07/17/2018 Elsevier Patient Education  [employer].
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
## 2019-08-21 ENCOUNTER — Telehealth: Payer: Self-pay | Admitting: Physician Assistant

## 2019-08-21 ENCOUNTER — Other Ambulatory Visit: Payer: Self-pay | Admitting: Emergency Medicine

## 2019-08-21 DIAGNOSIS — E782 Mixed hyperlipidemia: Secondary | ICD-10-CM

## 2019-08-21 DIAGNOSIS — E559 Vitamin D deficiency, unspecified: Secondary | ICD-10-CM

## 2019-08-21 MED ORDER — CIPROFLOXACIN HCL 500 MG PO TABS: 500.0000 mg | ORAL_TABLET | Freq: Two times a day (BID) | ORAL | 0 refills | Status: DC

## 2019-08-21 MED ORDER — ATORVASTATIN CALCIUM 10 MG PO TABS: 10.0000 mg | ORAL_TABLET | Freq: Every day | ORAL | 3 refills | Status: DC

## 2019-08-21 MED ORDER — VITAMIN D (ERGOCALCIFEROL) 1.25 MG (50000 UNIT) PO CAPS: ORAL_CAPSULE | ORAL | 0 refills | Status: DC

## 2019-08-21 NOTE — Telephone Encounter (Signed)
Oral antibiotic (Cipro) sent to pharmacy to take as directed.

## 2019-08-21 NOTE — Telephone Encounter (Signed)
Spoke with patient about her lab results.   Patient also states that her ear is not better but worsening. She has been using the ear drops prescribed and is worst.

## 2019-08-21 NOTE — Telephone Encounter (Signed)
Pt called stating her right ear is worse than it was. She has been up since 3:30am in pain. She said she has been using the drops but thinks she is going to need ABX.   Pt also asked about lab results and if she should be starting Vit D. Please call for results first.   Pharmacy : [id number] - [place], [place]

## 2019-08-22 LAB — ESTROGENS, TOTAL: Estrogen: 160.4 pg/mL

## 2019-09-08 ENCOUNTER — Other Ambulatory Visit: Payer: Self-pay | Admitting: Physician Assistant

## 2019-09-08 DIAGNOSIS — Z1231 Encounter for screening mammogram for malignant neoplasm of breast: Secondary | ICD-10-CM

## 2019-09-16 ENCOUNTER — Other Ambulatory Visit: Payer: Self-pay

## 2019-09-16 DIAGNOSIS — Z20822 Contact with and (suspected) exposure to covid-19: Secondary | ICD-10-CM

## 2019-09-16 DIAGNOSIS — Z20828 Contact with and (suspected) exposure to other viral communicable diseases: Secondary | ICD-10-CM | POA: Diagnosis not present

## 2019-09-17 LAB — NOVEL CORONAVIRUS, NAA: SARS-CoV-2, NAA: NOT DETECTED

## 2019-09-25 DIAGNOSIS — M4316 Spondylolisthesis, lumbar region: Secondary | ICD-10-CM | POA: Diagnosis not present

## 2019-09-25 DIAGNOSIS — M5116 Intervertebral disc disorders with radiculopathy, lumbar region: Secondary | ICD-10-CM | POA: Diagnosis not present

## 2019-09-25 DIAGNOSIS — M545 Low back pain: Secondary | ICD-10-CM | POA: Diagnosis not present

## 2019-10-12 DIAGNOSIS — M545 Low back pain: Secondary | ICD-10-CM | POA: Diagnosis not present

## 2019-10-26 DIAGNOSIS — M545 Low back pain: Secondary | ICD-10-CM | POA: Diagnosis not present

## 2019-10-29 ENCOUNTER — Ambulatory Visit
Admission: RE | Admit: 2019-10-29 | Discharge: 2019-10-29 | Disposition: A | Discharge status: Home / Self Care | Payer: BC Managed Care – PPO | Source: Ambulatory Visit | Attending: Physician Assistant | Admitting: Physician Assistant

## 2019-10-29 ENCOUNTER — Other Ambulatory Visit: Payer: Self-pay

## 2019-10-29 DIAGNOSIS — Z1231 Encounter for screening mammogram for malignant neoplasm of breast: Secondary | ICD-10-CM | POA: Diagnosis not present

## 2019-11-06 ENCOUNTER — Other Ambulatory Visit: Payer: Self-pay | Admitting: Physician Assistant

## 2019-11-06 DIAGNOSIS — E559 Vitamin D deficiency, unspecified: Secondary | ICD-10-CM

## 2019-11-06 DIAGNOSIS — E782 Mixed hyperlipidemia: Secondary | ICD-10-CM

## 2020-01-08 DIAGNOSIS — Z03818 Encounter for observation for suspected exposure to other biological agents ruled out: Secondary | ICD-10-CM | POA: Diagnosis not present

## 2020-01-08 DIAGNOSIS — Z20828 Contact with and (suspected) exposure to other viral communicable diseases: Secondary | ICD-10-CM | POA: Diagnosis not present

## 2020-01-12 IMAGING — CT CT HEAD W/O CM
3 series · 16 of 47 positions shown, 19 images · non-contrast
Comparison: None.

CLINICAL DATA: New onset leg cramps and left-sided facial numbness
and tingling beginning today.

EXAM:
CT HEAD WITHOUT CONTRAST
TECHNIQUE: Contiguous axial images were obtained from the base of the skull
through the vertex without intravenous contrast.

[Series 2: head wo · axial · 0.41mm/px · z∈[-204,-79]mm · 10 of 31 slices shown, 13 images]
[im 3/31  brain]
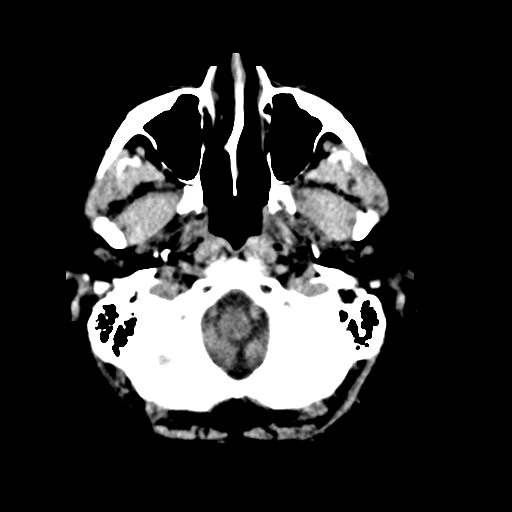
[im 3/31  bone]
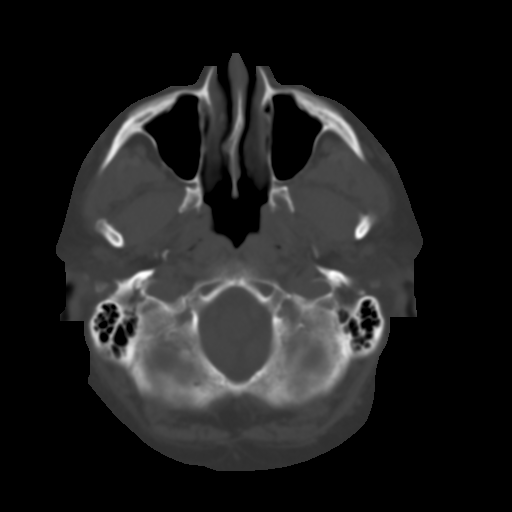
[im 6/31  brain]
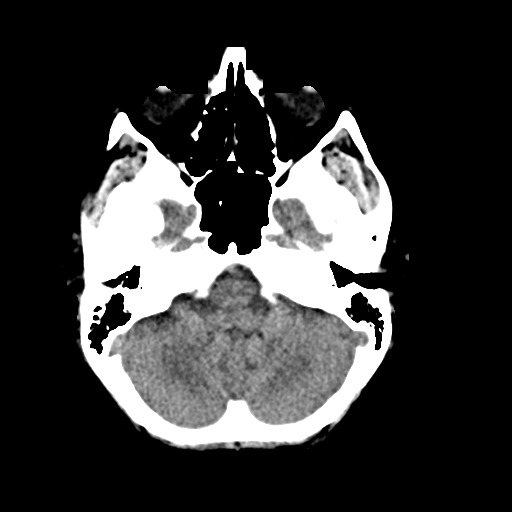
[im 9/31  brain]
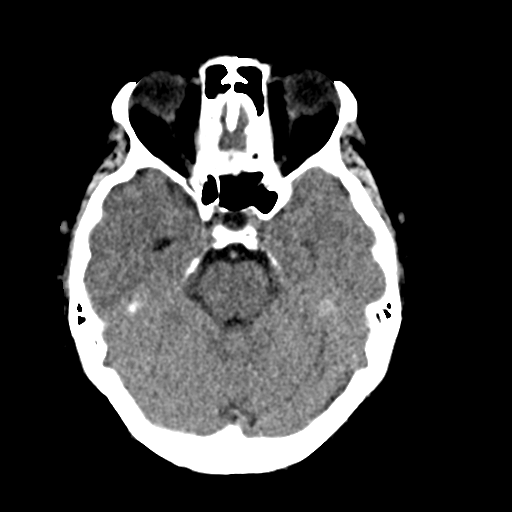
[im 11/31  brain]
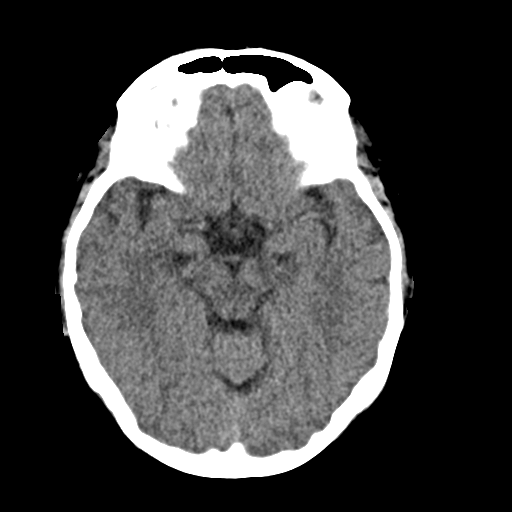
[im 14/31  brain]
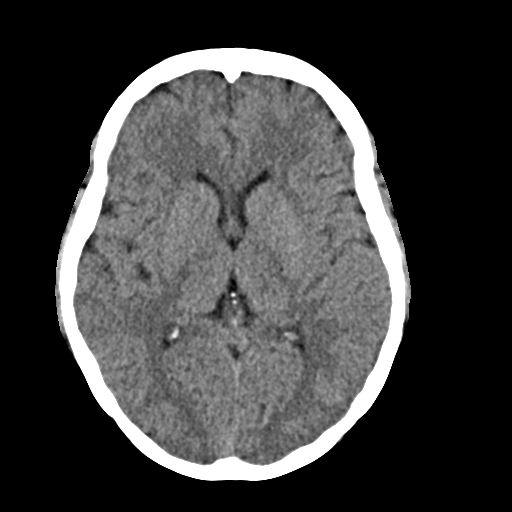
[im 14/31  bone]
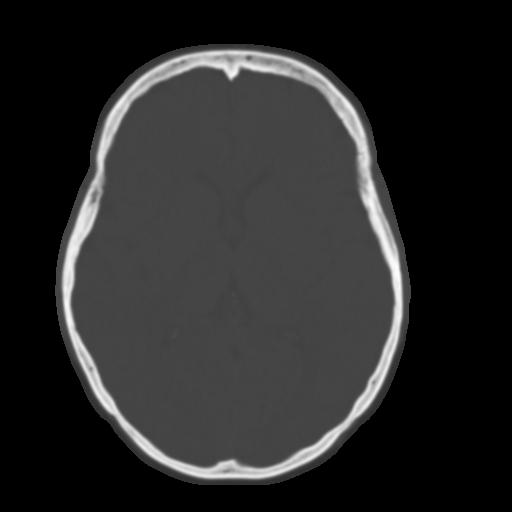
[im 17/31  brain]
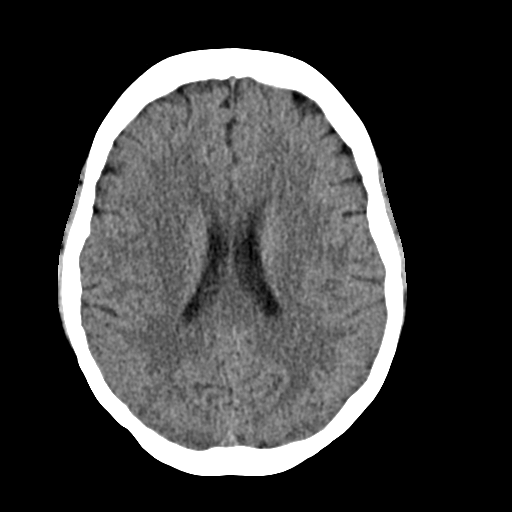
[im 20/31  brain]
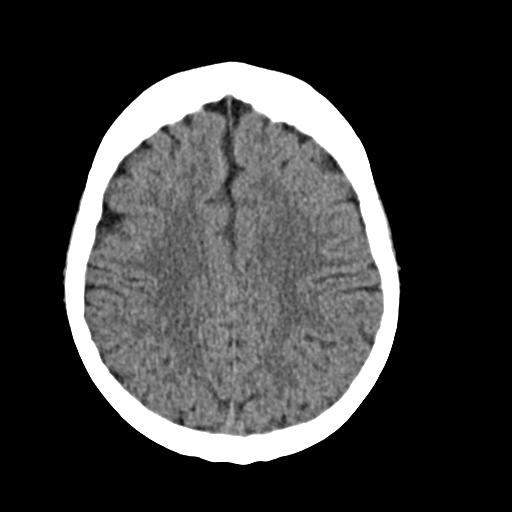
[im 23/31  brain]
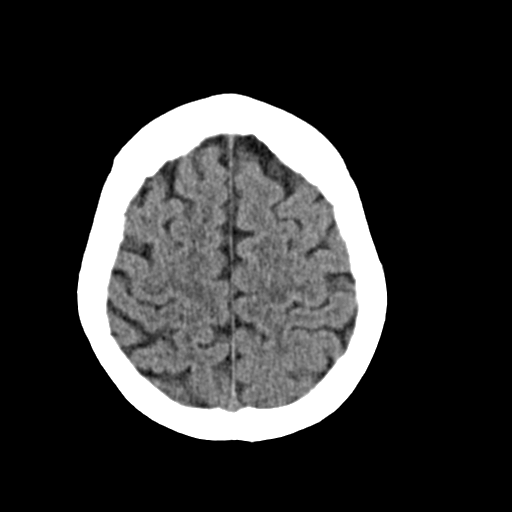
[im 25/31  brain]
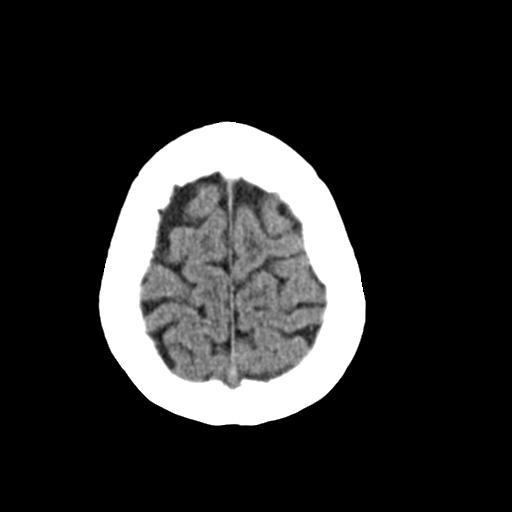
[im 25/31  bone]
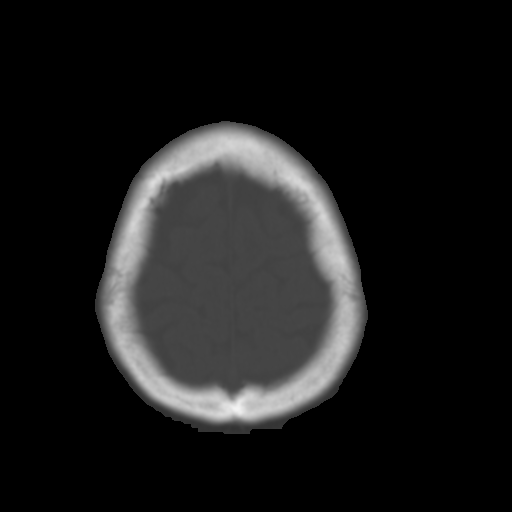
[im 28/31  brain]
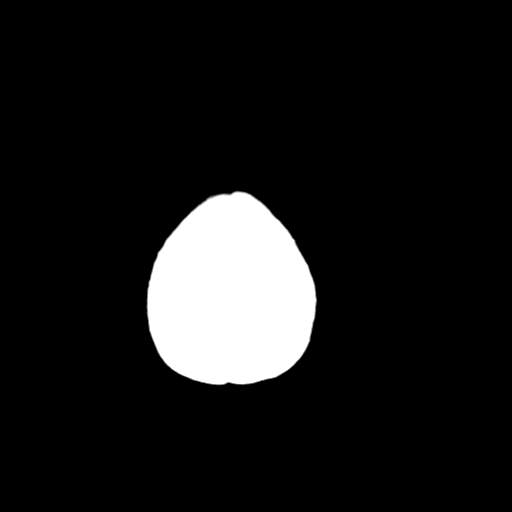

[Series 4: coronal soft · coronal · 0.31mm/px · 3 of 63 slices shown]
[im 21/63  brain]
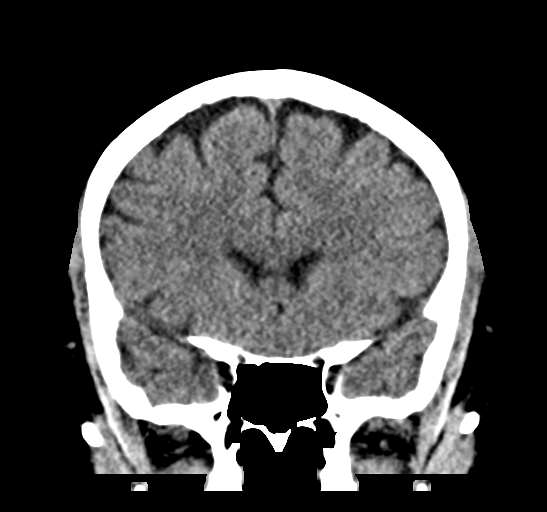
[im 28/63  brain]
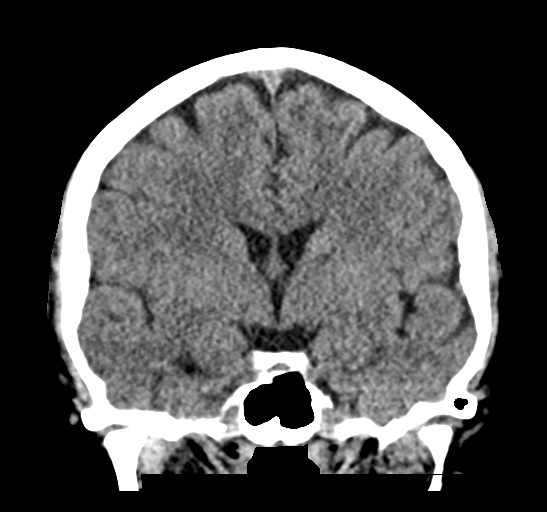
[im 35/63  brain]
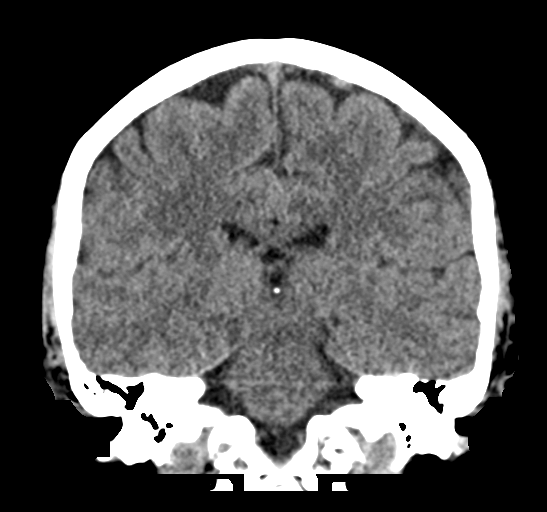

[Series 5: sag soft · sagittal · 0.31mm/px · 3 of 58 slices shown]
[im 20/58  brain]
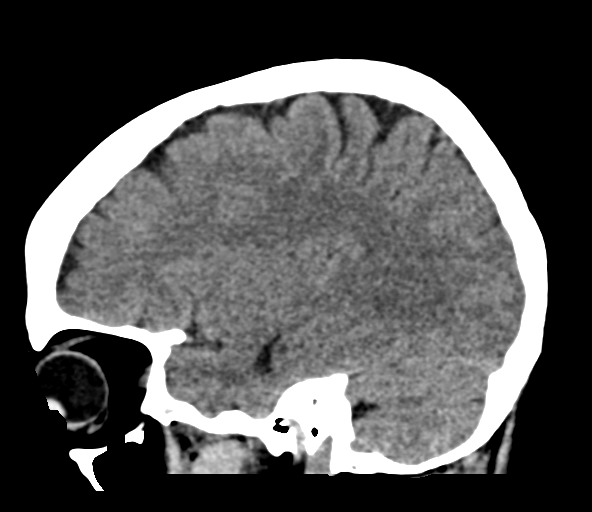
[im 29/58  brain]
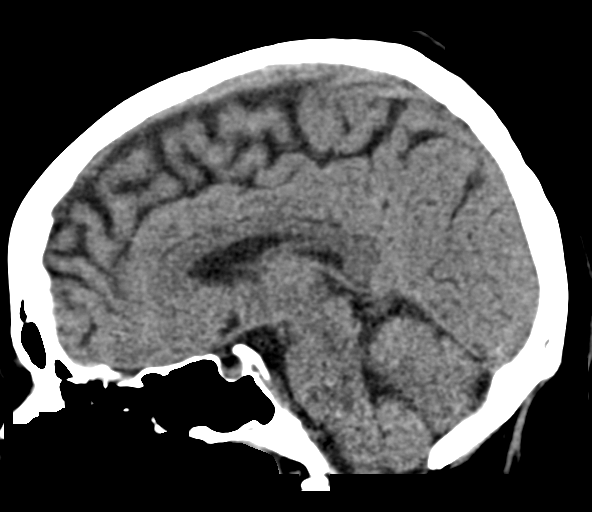
[im 39/58  brain]
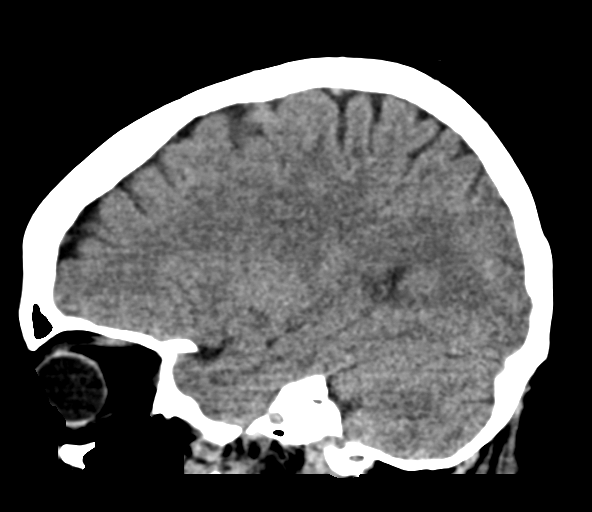

[16 of 47 positions shown; findings below may reference images not displayed]

FINDINGS: Brain: No acute infarct, hemorrhage, or mass lesion is present. No
significant white matter lesions are present. The ventricles are of
normal size. No significant extraaxial fluid collection is present.
The brainstem and cerebellum are within normal limits.

Vascular: No hyperdense vessel or unexpected calcification.

Skull: Calvarium is intact. No focal lytic or blastic lesions are
present.

Sinuses/Orbits: The paranasal sinuses and mastoid air cells are
clear. The globes and orbits are within normal limits.
IMPRESSION: Normal CT of the head without contrast.

## 2020-01-14 ENCOUNTER — Ambulatory Visit (INDEPENDENT_AMBULATORY_CARE_PROVIDER_SITE_OTHER): Payer: BC Managed Care – PPO | Admitting: Family Medicine

## 2020-01-14 ENCOUNTER — Other Ambulatory Visit: Payer: Self-pay

## 2020-01-14 ENCOUNTER — Encounter (INDEPENDENT_AMBULATORY_CARE_PROVIDER_SITE_OTHER): Payer: Self-pay | Admitting: Family Medicine

## 2020-01-14 ENCOUNTER — Ambulatory Visit (INDEPENDENT_AMBULATORY_CARE_PROVIDER_SITE_OTHER): Payer: BLUE CROSS/BLUE SHIELD | Admitting: Family Medicine

## 2020-01-14 VITALS — BP 121/77 | HR 73 | Temp 98.3°F | Ht 62.0 in | Wt 262.0 lb

## 2020-01-14 DIAGNOSIS — E559 Vitamin D deficiency, unspecified: Secondary | ICD-10-CM | POA: Diagnosis not present

## 2020-01-14 DIAGNOSIS — R739 Hyperglycemia, unspecified: Secondary | ICD-10-CM | POA: Diagnosis not present

## 2020-01-14 DIAGNOSIS — R5383 Other fatigue: Secondary | ICD-10-CM | POA: Diagnosis not present

## 2020-01-14 DIAGNOSIS — E7849 Other hyperlipidemia: Secondary | ICD-10-CM | POA: Diagnosis not present

## 2020-01-14 DIAGNOSIS — F3289 Other specified depressive episodes: Secondary | ICD-10-CM | POA: Diagnosis not present

## 2020-01-14 DIAGNOSIS — R0602 Shortness of breath: Secondary | ICD-10-CM | POA: Diagnosis not present

## 2020-01-14 DIAGNOSIS — E538 Deficiency of other specified B group vitamins: Secondary | ICD-10-CM

## 2020-01-14 DIAGNOSIS — K5909 Other constipation: Secondary | ICD-10-CM

## 2020-01-14 DIAGNOSIS — Z0289 Encounter for other administrative examinations: Secondary | ICD-10-CM

## 2020-01-14 DIAGNOSIS — Z6841 Body Mass Index (BMI) 40.0 and over, adult: Secondary | ICD-10-CM

## 2020-01-14 NOTE — Progress Notes (Signed)
Chief Complaint:   OBESITY Mikayla [name] (MR# [id number]) is a 54 y.o. female who presents for evaluation and treatment of obesity and related comorbidities. Current BMI is Body mass index is 47.92 kg/m. Mikayla [name] has been struggling with her weight for many years and has been unsuccessful in either losing weight, maintaining weight loss, or reaching her healthy weight goal.  Mikayla [name] is currently in the action stage of change and ready to dedicate time achieving and maintaining a healthier weight. Mikayla [name] is interested in becoming our patient and working on intensive lifestyle modifications including (but not limited to) diet and exercise for weight loss.  Mikayla [name]'s habits were reviewed today and are as follows: Her family eats meals together, her desired weight loss is 97-112 lbs, she has been heavy most of her life, she started gaining weight after 54 years of age, her heaviest weight ever was 278 pounds, she has significant food cravings issues, she snacks frequently in the evenings, she skips meals frequently, she frequently makes poor food choices, she has problems with excessive hunger, she frequently eats larger portions than normal and she struggles with emotional eating.  Depression Screen Mikayla [name]'s Food and Mood (modified PHQ-9) score was 18.  Depression screen Mikayla [name] 2/9 01/14/2020  Decreased Interest 3  Down, Depressed, Hopeless 1  PHQ - 2 Score 4  Altered sleeping 3  Tired, decreased energy 3  Change in appetite 2  Feeling bad or failure about yourself  3  Trouble concentrating 1  Moving slowly or fidgety/restless 2  Suicidal thoughts 0  PHQ-9 Score 18  Difficult doing work/chores Very difficult   Subjective:   1. Other fatigue Mikayla [name] admits to daytime somnolence and admits to waking up still tired. Patent has a history of symptoms of daytime fatigue. Mikayla [name] generally gets 4 or 5 hours of sleep per night, and states that she has difficulty falling asleep. Snoring is  present. Apneic episodes are not present. Epworth Sleepiness Score is 6.  2. SOB (shortness of breath) on exertion Mikayla [name] notes increasing shortness of breath with exercising and seems to be worsening over time with weight gain. She notes getting out of breath sooner with activity than she used to. This has not gotten worse recently. Mikayla [name] denies shortness of breath at rest or orthopnea.  3. Hyperglycemia Mikayla [name] is on metformin, and she has elevated A1c in the pre-diabetes range. She is working on diet and weight loss.  4. Other hyperlipidemia Mikayla [name] is on Lipitor and she denies chest pain. She would like to improve with diet.  5. Vitamin D deficiency Mikayla [name] is on Vit D prescription weekly, and she notes fatigue.  6. Vitamin B12 deficiency Mikayla [name] is on metformin, but she is not on B12 currently. She notes fatigue.  7. Chronic constipation Mikayla [name] has had chronic constipation for years. She sometimes has a BM only once a week. She has never taken miralax.  8. Other depression, with emotional eating Mikayla [name] notes non-hungry eating and emotional eating behaviors.  Assessment/Plan:   1. Other fatigue Mikayla [name] does feel that her weight is causing her energy to be lower than it should be. Fatigue may be related to obesity, depression or many other causes. Labs will be ordered, and in the meanwhile, Mikayla [name] will focus on self care including making healthy food choices, increasing physical activity and focusing on stress reduction.  - EKG 12-Lead - CBC with Differential/Platelet - Folate - T3 - T4, free - TSH  2. SOB (shortness of breath) on exertion  Mikayla [name] does feel that she gets out of breath more easily that she used to when she exercises. Mikayla [name]'s shortness of breath appears to be obesity related and exercise induced. She has agreed to work on weight loss and gradually increase exercise to treat her exercise induced shortness of breath. Will continue to monitor closely.  3.  Hyperglycemia Fasting labs will be obtained today and results with be discussed with Mikayla [name] in 2 weeks at her follow up visit. In the meanwhile Mikayla [name] was started on a lower simple carbohydrate diet and will work on weight loss efforts.  - Comprehensive metabolic panel - Hemoglobin A1c - Insulin, random  4. Other hyperlipidemia Cardiovascular risk and specific lipid/LDL goals reviewed. We discussed several lifestyle modifications today Mikayla [name] will continue Lipitor and will start her diet, and will continue to work on exercise and weight loss efforts. We will check labs today. Orders and follow up as documented in patient record.   - Lipid Panel With LDL/HDL Ratio  5. Vitamin D deficiency Low Vitamin D level contributes to fatigue and are associated with obesity, breast, and colon cancer. We will check labs today. Mikayla [name] will follow-up for routine testing of Vitamin D, at least 2-3 times per year to avoid over-replacement.  - VITAMIN D 25 Hydroxy (Vit-D Deficiency, Fractures)  6. Vitamin B12 deficiency The diagnosis was reviewed with the patient. Counseling provided today, see below. We will check labs today, and we will continue to monitor. Orders and follow up as documented in patient record.  - Vitamin B12  7. Chronic constipation Mikayla [name] was informed that a decrease in bowel movement frequency is normal while losing weight, but stools should not be hard or painful. Mikayla [name] agreed to start miralax 17g daily and she is to increase her water intake. Orders and follow up as documented in patient record.   8. Other depression, with emotional eating Behavior modification techniques were discussed today to help Mikayla [name] deal with her emotional/non-hunger eating behaviors. We will refer to Mikayla [name], our Bariatric Psychologist for evaluation. Orders and follow up as documented in patient record.   9. Class 3 severe obesity with serious comorbidity and body mass index (BMI) of 45.0 to 49.9 in  adult, unspecified obesity type (HCC) Mikayla [name] is currently in the action stage of change and her goal is to continue with weight loss efforts. I recommend Mikayla [name] begin the structured treatment plan as follows:  She has agreed to the Category 3 Plan.  Exercise goals: No exercise has been prescribed for now, while we concentrate on nutritional changes.  Behavioral modification strategies: increasing lean protein intake and meal planning and cooking strategies.  She was informed of the importance of frequent follow-up visits to maximize her success with intensive lifestyle modifications for her multiple health conditions. She was informed we would discuss her lab results at her next visit unless there is a critical issue that needs to be addressed sooner. Mikayla [name] agreed to keep her next visit at the agreed upon time to discuss these results.  Objective:   Blood pressure 121/77, pulse 73, temperature 98.3 F (36.8 C), temperature source Oral, height 5\' 2"  (1.575 m), weight 262 lb (118.8 kg), SpO2 95 %. Body mass index is 47.92 kg/m.  EKG: Normal sinus rhythm, rate 76 BPM.  Indirect Calorimeter completed today shows a VO2 of 280 and a REE of 1948.  Her calculated basal metabolic rate is thus her basal metabolic rate is better than expected.  General: Cooperative, alert, well developed, in no  acute distress. HEENT: Conjunctivae and lids unremarkable. Cardiovascular: Regular rhythm.  Lungs: Normal work of breathing. Neurologic: No focal deficits.   Lab Results  Component Value Date   CREATININE 0.56 08/19/2019   BUN 15 08/19/2019   NA 140 08/19/2019   K 4.5 08/19/2019   CL 104 08/19/2019   CO2 29 08/19/2019   Lab Results  Component Value Date   ALT 16 08/19/2019   AST 12 08/19/2019   ALKPHOS 79 08/19/2019   BILITOT 0.4 08/19/2019   Lab Results  Component Value Date   HGBA1C 6.0 08/19/2019   HGBA1C 6.2 01/20/2019   HGBA1C 6.1 08/27/2018   HGBA1C 5.8 (H) 01/03/2018   HGBA1C  5.9 07/05/2017   No results found for: INSULIN Lab Results  Component Value Date   TSH 2.41 08/19/2019   Lab Results  Component Value Date   CHOL 233 (H) 08/19/2019   HDL 54.20 08/19/2019   LDLCALC 151 (H) 08/19/2019   LDLDIRECT 130.0 01/20/2019   TRIG 139.0 08/19/2019   CHOLHDL 4 08/19/2019   Lab Results  Component Value Date   WBC 6.2 08/19/2019   HGB 13.0 08/19/2019   HCT 39.4 08/19/2019   MCV 90.8 08/19/2019   PLT 170.0 08/19/2019   Lab Results  Component Value Date   IRON 89 01/20/2019   TIBC 315 01/20/2019   FERRITIN 109 01/20/2019   Attestation Statements:   Reviewed by clinician on day of visit: allergies, medications, problem list, medical history, surgical history, family history, social history, and previous encounter notes.  Time spent on visit including pre-visit chart review and post-visit charting and care was 60 minutes.    I, Trixie Dredge, am acting as transcriptionist for Dennard Nip, MD.  I have reviewed the above documentation for accuracy and completeness, and I agree with the above. - Dennard Nip, MD
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
## 2020-01-15 LAB — COMPREHENSIVE METABOLIC PANEL
ALT: 12 IU/L (ref 0–32)
AST: 14 IU/L (ref 0–40)
Albumin/Globulin Ratio: 1.4 (ref 1.2–2.2)
Albumin: 4.1 g/dL (ref 3.8–4.9)
Alkaline Phosphatase: 89 IU/L (ref 39–117)
BUN/Creatinine Ratio: 30 — ABNORMAL HIGH (ref 9–23)
BUN: 17 mg/dL (ref 6–24)
Bilirubin Total: 0.3 mg/dL (ref 0.0–1.2)
CO2: 23 mmol/L (ref 20–29)
Calcium: 9.4 mg/dL (ref 8.7–10.2)
Chloride: 101 mmol/L (ref 96–106)
Creatinine, Ser: 0.56 mg/dL — ABNORMAL LOW (ref 0.57–1.00)
GFR calc Af Amer: 121 mL/min/{1.73_m2} (ref >59)
GFR calc non Af Amer: 105 mL/min/{1.73_m2} (ref >59)
Globulin, Total: 2.9 g/dL (ref 1.5–4.5)
Glucose: 87 mg/dL (ref 65–99)
Potassium: 4.5 mmol/L (ref 3.5–5.2)
Sodium: 140 mmol/L (ref 134–144)
Total Protein: 7 g/dL (ref 6.0–8.5)

## 2020-01-15 LAB — HEMOGLOBIN A1C
Est. average glucose Bld gHb Est-mCnc: 123 mg/dL
Hgb A1c MFr Bld: 5.9 % — ABNORMAL HIGH (ref 4.8–5.6)

## 2020-01-15 LAB — CBC WITH DIFFERENTIAL/PLATELET
Basophils Absolute: 0 10*3/uL (ref 0.0–0.2)
Basos: 1 %
EOS (ABSOLUTE): 0.1 10*3/uL (ref 0.0–0.4)
Eos: 3 %
Hematocrit: 40.2 % (ref 34.0–46.6)
Hemoglobin: 13.7 g/dL (ref 11.1–15.9)
Immature Grans (Abs): 0 10*3/uL (ref 0.0–0.1)
Immature Granulocytes: 1 %
Lymphocytes Absolute: 1.7 10*3/uL (ref 0.7–3.1)
Lymphs: 33 %
MCH: 29.8 pg (ref 26.6–33.0)
MCHC: 34.1 g/dL (ref 31.5–35.7)
MCV: 88 fL (ref 79–97)
Monocytes Absolute: 0.4 10*3/uL (ref 0.1–0.9)
Monocytes: 8 %
Neutrophils Absolute: 3 10*3/uL (ref 1.4–7.0)
Neutrophils: 54 %
Platelets: 198 10*3/uL (ref 150–450)
RBC: 4.59 x10E6/uL (ref 3.77–5.28)
RDW: 12.8 % (ref 11.7–15.4)
WBC: 5.3 10*3/uL (ref 3.4–10.8)

## 2020-01-15 LAB — T4, FREE: Free T4: 1.25 ng/dL (ref 0.82–1.77)

## 2020-01-15 LAB — LIPID PANEL WITH LDL/HDL RATIO
Cholesterol, Total: 245 mg/dL — ABNORMAL HIGH (ref 100–199)
HDL: 56 mg/dL (ref >39)
LDL Chol Calc (NIH): 164 mg/dL — ABNORMAL HIGH (ref 0–99)
LDL/HDL Ratio: 2.9 ratio (ref 0.0–3.2)
Triglycerides: 137 mg/dL (ref 0–149)
VLDL Cholesterol Cal: 25 mg/dL (ref 5–40)

## 2020-01-15 LAB — INSULIN, RANDOM: INSULIN: 18.6 u[IU]/mL (ref 2.6–24.9)

## 2020-01-15 LAB — VITAMIN B12: Vitamin B-12: 377 pg/mL (ref 232–1245)

## 2020-01-15 LAB — TSH: TSH: 2.08 u[IU]/mL (ref 0.450–4.500)

## 2020-01-15 LAB — T3: T3, Total: 138 ng/dL (ref 71–180)

## 2020-01-15 LAB — FOLATE: Folate: 8.1 ng/mL (ref >3.0)

## 2020-01-15 LAB — VITAMIN D 25 HYDROXY (VIT D DEFICIENCY, FRACTURES): Vit D, 25-Hydroxy: 19.2 ng/mL — ABNORMAL LOW (ref 30.0–100.0)

## 2020-01-21 ENCOUNTER — Ambulatory Visit (INDEPENDENT_AMBULATORY_CARE_PROVIDER_SITE_OTHER): Payer: BC Managed Care – PPO | Admitting: Psychology

## 2020-01-21 ENCOUNTER — Other Ambulatory Visit: Payer: Self-pay

## 2020-01-21 DIAGNOSIS — F3289 Other specified depressive episodes: Secondary | ICD-10-CM | POA: Diagnosis not present

## 2020-01-28 ENCOUNTER — Encounter (INDEPENDENT_AMBULATORY_CARE_PROVIDER_SITE_OTHER): Payer: Self-pay | Admitting: Family Medicine

## 2020-01-28 ENCOUNTER — Other Ambulatory Visit: Payer: Self-pay

## 2020-01-28 ENCOUNTER — Ambulatory Visit (INDEPENDENT_AMBULATORY_CARE_PROVIDER_SITE_OTHER): Payer: BC Managed Care – PPO | Admitting: Family Medicine

## 2020-01-28 VITALS — BP 101/69 | HR 71 | Temp 97.9°F | Ht 62.0 in | Wt 259.0 lb

## 2020-01-28 DIAGNOSIS — E860 Dehydration: Secondary | ICD-10-CM | POA: Diagnosis not present

## 2020-01-28 DIAGNOSIS — E7849 Other hyperlipidemia: Secondary | ICD-10-CM

## 2020-01-28 DIAGNOSIS — R7303 Prediabetes: Secondary | ICD-10-CM

## 2020-01-28 DIAGNOSIS — E559 Vitamin D deficiency, unspecified: Secondary | ICD-10-CM

## 2020-01-28 DIAGNOSIS — E66813 Obesity, class 3: Secondary | ICD-10-CM

## 2020-01-28 DIAGNOSIS — Z6841 Body Mass Index (BMI) 40.0 and over, adult: Secondary | ICD-10-CM

## 2020-01-28 DIAGNOSIS — Z9189 Other specified personal risk factors, not elsewhere classified: Secondary | ICD-10-CM | POA: Diagnosis not present

## 2020-01-28 MED ORDER — METFORMIN HCL 500 MG PO TABS: 500.0000 mg | ORAL_TABLET | Freq: Every day | ORAL | 0 refills | Status: DC

## 2020-01-28 MED ORDER — ATORVASTATIN CALCIUM 10 MG PO TABS: 10.0000 mg | ORAL_TABLET | Freq: Every day | ORAL | 3 refills | Status: DC

## 2020-01-28 MED ORDER — VITAMIN D (ERGOCALCIFEROL) 1.25 MG (50000 UNIT) PO CAPS: 50000.0000 [IU] | ORAL_CAPSULE | ORAL | 0 refills | Status: DC
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
## 2020-01-29 ENCOUNTER — Ambulatory Visit: Payer: BC Managed Care – PPO | Attending: Internal Medicine

## 2020-01-29 DIAGNOSIS — Z23 Encounter for immunization: Secondary | ICD-10-CM

## 2020-01-29 NOTE — Progress Notes (Signed)
   Covid-19 Vaccination Clinic  Name:  Mikayla [name]    MRN: [id number] DOB: [birth year]  01/29/2020  [name] was observed post Covid-19 immunization for 15 minutes without incident. She was provided with Vaccine Information Sheet and instruction to access the V-Safe system.   [name] was instructed to call 911 with any severe reactions post vaccine: Marland Kitchen Difficulty breathing  . Swelling of face and throat  . A fast heartbeat  . A bad rash all over body  . Dizziness and weakness   Immunizations Administered    Name Date Dose VIS Date Route   Pfizer COVID-19 Vaccine 01/29/2020 10:08 AM 0.3 mL 10/30/2019 Intramuscular   Manufacturer: [employer], [employer]   Lot: [id number]   NDC: [address]-8

## 2020-02-04 ENCOUNTER — Ambulatory Visit (INDEPENDENT_AMBULATORY_CARE_PROVIDER_SITE_OTHER): Payer: BC Managed Care – PPO | Admitting: Psychology

## 2020-02-04 ENCOUNTER — Other Ambulatory Visit: Payer: Self-pay

## 2020-02-04 DIAGNOSIS — F3289 Other specified depressive episodes: Secondary | ICD-10-CM

## 2020-02-04 NOTE — Progress Notes (Signed)
  Office: [phone]  /  Fax: [phone]    Date: February 04, 2020    Appointment Start Time: 4:00pm Duration: 37 minutes Provider: Lawerance Cruel, Psy.D. Type of Session: Individual Therapy  Location of Patient: Home Location of Provider: Provider's Home Type of Contact: Telepsychological Visit via [employer]  Session Content: Mikayla [name] is a 54 y.o. female presenting via [employer] for a follow-up appointment to address the previously established treatment goal of decreasing emotional eating. Today's appointment was a telepsychological visit due to COVID-19. [name] provided verbal consent for today's telepsychological appointment and she is aware she is responsible for securing confidentiality on her end of the session. Prior to proceeding with today's appointment, Alys's physical location at the time of this appointment was obtained as well a phone number she could be reached at in the event of technical difficulties. [name] and this provider participated in today's telepsychological service.   This provider conducted a brief check-in. Saudia shared about recent events, including weight loss. She stated she is experiencing difficulty eating all food on the meal plan. This was explored and she was encouraged to factor in a snack between lunch and dinner. She was receptive. It was also identified that Canary is not always eating snack calories; therefore, she was encouraged to eat those daily and different options to achieve the aforementioned were explored. Additionally, emotional and physical hunger was reviewed. Moreover, psychoeducation regarding triggers for emotional eating was provided. Uva was provided a handout, and encouraged to utilize the handout between now and the next appointment to increase awareness of triggers and frequency. [name] agreed. This provider also discussed behavioral strategies for specific triggers, such as placing the utensil down when conversing to avoid mindless  eating. [name] provided verbal consent during today's appointment for this provider to send a handout about triggers via e-mail. Furthermore, strategies (e.g., setting alarms) to increase water intake were discussed. Rubbie was receptive to today's appointment as evidenced by openness to sharing, responsiveness to feedback, willingness to explore triggers for emotional eating, and willingness to implement discussed strategies.   Mental Status Examination:  Appearance: well groomed and appropriate hygiene  Behavior: appropriate to circumstances Mood: euthymic Affect: mood congruent Speech: normal in rate, volume, and tone Eye Contact: appropriate Psychomotor Activity: appropriate Gait: unable to assess Thought Process: linear, logical, and goal directed  Thought Content/Perception: no hallucinations, delusions, bizarre thinking or behavior reported or observed and no evidence of suicidal and homicidal ideation, plan, and intent Orientation: time, person, place and purpose of appointment Memory/Concentration: memory, attention, language, and fund of knowledge intact  Insight/Judgment: good  Interventions:  Conducted a brief chart review Provided empathic reflections and validation Reviewed content from the previous session Employed supportive psychotherapy interventions to facilitate reduced distress and to improve coping skills with identified stressors Employed motivational interviewing skills to assess patient's willingness/desire to adhere to recommended medical treatments and assignments Engaged patient in problem solving Psychoeducation provided regarding triggers for emotional eating  DSM-5 Diagnosis(es): 311 (F32.8) Other Specified Depressive Disorder, Emotional Eating Behaviors  Treatment Goal & Progress: During the initial appointment with this provider, the following treatment goal was established: decrease emotional eating. Lillyahna has demonstrated progress in her goal as  evidenced by increased awareness of hunger patterns.  Plan: The next appointment will be scheduled in approximately two weeks, which will be via [employer]. The next session will focus on working towards the established treatment goal.

## 2020-02-08 NOTE — Progress Notes (Signed)
Office: [phone]  /  Fax: [phone]    Date: February 22, 2020   Appointment Start Time: 10:29am Duration: 26 minutes Provider: Lawerance Cruel, Psy.D. Type of Session: Individual Therapy  Location of Patient: Home Location of Provider: Provider's Home Type of Contact: Telepsychological Visit via [employer]  Session Content: Mikayla [name] is a 54 y.o. female presenting via [employer] for a follow-up appointment to address the previously established treatment goal of decreasing emotional eating. Today's appointment was a telepsychological visit due to COVID-19. Mikayla [name] provided verbal consent for today's telepsychological appointment and she is aware she is responsible for securing confidentiality on her end of the session. Prior to proceeding with today's appointment, Mikayla [name]'s physical location at the time of this appointment was obtained as well a phone number she could be reached at in the event of technical difficulties. Mikayla [name] and this provider participated in today's telepsychological service.   This provider conducted a brief check-in and verbally administered the PHQ-9 and GAD-7. Mikayla [name] reported, "Everything is going pretty good." She reported a reduction in emotional eating. Mikayla [name] acknowledged biting her nails again, adding she stopped for awhile. This was explored further. Mikayla [name] acknowledged experiencing back pain, noting she has informed her doctor about the pain. Mikayla [name]'s pain was assessed on a scale of 0-10, where 0 is no pain and 10 is the worst pain ever experienced. She noted her pain as "4 to 5." Psychoeducation regarding mindfulness was provided. Mikayla [name] provided verbal consent during today's appointment for this provider to send a handout about mindfulness via e-mail. During today's appointment, Mikayla [name] was engaged in a body scan mindfulness exercise to assist with coping. Her experience was processed and pain was re-assessed using the same scale. She noted her pain as "2 to 3"  following the exercise and described feeling more relaxed. Mikayla [name] provided verbal consent during today's appointment for this provider to send the handout for today's exercise via e-mail. This provider also discussed the utilization of YouTube for mindfulness exercises (e.g., exercises by [name]). Furthermore, frequency of appointments moving forward was discussed based on Mikayla [name]'s self-report of progress as it relates to emotional eating. Overall, Mikayla [name] was receptive to today's appointment as evidenced by openness to sharing, responsiveness to feedback, and willingness to engage in mindfulness exercises to assist with coping.  Mental Status Examination:  Appearance: well groomed and appropriate hygiene  Behavior: appropriate to circumstances Mood: euthymic Affect: mood congruent Speech: normal in rate, volume, and tone Eye Contact: appropriate Psychomotor Activity: appropriate Gait: unable to assess Thought Process: linear, logical, and goal directed  Thought Content/Perception: no hallucinations, delusions, bizarre thinking or behavior reported or observed and no evidence of suicidal and homicidal ideation, plan, and intent Orientation: time, person, place and purpose of appointment Memory/Concentration: memory, attention, language, and fund of knowledge intact  Insight/Judgment: good  Structured Assessments Results: The Patient Health Questionnaire-9 (PHQ-9) is a self-report measure that assesses symptoms and severity of depression over the course of the last two weeks. Mikayla [name] obtained a score of 5 suggesting mild depression. Mikayla [name] finds the endorsed symptoms to be somewhat difficult. [0= Not at all; 1= Several days; 2= More than half the days; 3= Nearly every day] Little interest or pleasure in doing things 0  Feeling down, depressed, or hopeless 0  Trouble falling or staying asleep, or sleeping too much 3  Feeling tired or having little energy 1  Poor appetite or overeating 1    Feeling bad about yourself --- or that you are a failure or have let  yourself or your family down 0  Trouble concentrating on things, such as reading the newspaper or watching television 0  Moving or speaking so slowly that other people could have noticed? Or the opposite --- being so fidgety or restless that you have been moving around a lot more than usual 0  Thoughts that you would be better off dead or hurting yourself in some way 0  PHQ-9 Score 5    The Generalized Anxiety Disorder-7 (GAD-7) is a brief self-report measure that assesses symptoms of anxiety over the course of the last two weeks. Mikayla [name] obtained a score of 5 suggesting mild anxiety. Mikayla [name] finds the endorsed symptoms to be somewhat difficult. [0= Not at all; 1= Several days; 2= Over half the days; 3= Nearly every day] Feeling nervous, anxious, on edge 0  Not being able to stop or control worrying 0  Worrying too much about different things 1  Trouble relaxing 1  Being so restless that it's hard to sit still 1  Becoming easily annoyed or irritable 1  Feeling afraid as if something awful might happen 1  GAD-7 Score 5   Interventions:  Conducted a brief chart review Verbally administered PHQ-9 and GAD-7 for symptom monitoring Provided empathic reflections and validation Employed supportive psychotherapy interventions to facilitate reduced distress and to improve coping skills with identified stressors Employed motivational interviewing skills to assess patient's willingness/desire to adhere to recommended medical treatments and assignments Psychoeducation provided regarding mindfulness Engaged patient in mindfulness exercise(s) Employed acceptance and commitment interventions to emphasize mindfulness and acceptance without struggle  DSM-5 Diagnosis(es): 311 (F32.8) Other Specified Depressive Disorder, Emotional Eating Behaviors  Treatment Goal & Progress: During the initial appointment with this provider, the following  treatment goal was established: decrease emotional eating. Mikayla [name] has demonstrated progress in her goal as evidenced by increased awareness of hunger patterns, increased awareness of triggers for emotional eating and reduction in emotional eating. Adama also demonstrates willingness to engage in mindfulness exercises.  Plan: The next appointment will be scheduled in approximately 3-4 weeks, which will be via MyChart Video Visit. The next session will focus on working towards the established treatment goal.

## 2020-02-15 ENCOUNTER — Encounter (INDEPENDENT_AMBULATORY_CARE_PROVIDER_SITE_OTHER): Payer: Self-pay | Admitting: Family Medicine

## 2020-02-15 ENCOUNTER — Ambulatory Visit (INDEPENDENT_AMBULATORY_CARE_PROVIDER_SITE_OTHER): Payer: BC Managed Care – PPO | Admitting: Family Medicine

## 2020-02-15 ENCOUNTER — Other Ambulatory Visit: Payer: Self-pay | Admitting: Physician Assistant

## 2020-02-15 ENCOUNTER — Other Ambulatory Visit: Payer: Self-pay

## 2020-02-15 VITALS — BP 108/70 | HR 68 | Temp 97.6°F | Ht 62.0 in | Wt 255.0 lb

## 2020-02-15 DIAGNOSIS — E559 Vitamin D deficiency, unspecified: Secondary | ICD-10-CM

## 2020-02-15 DIAGNOSIS — Z9189 Other specified personal risk factors, not elsewhere classified: Secondary | ICD-10-CM | POA: Diagnosis not present

## 2020-02-15 DIAGNOSIS — R7303 Prediabetes: Secondary | ICD-10-CM | POA: Diagnosis not present

## 2020-02-15 DIAGNOSIS — E7849 Other hyperlipidemia: Secondary | ICD-10-CM

## 2020-02-15 DIAGNOSIS — Z6841 Body Mass Index (BMI) 40.0 and over, adult: Secondary | ICD-10-CM

## 2020-02-15 DIAGNOSIS — K5904 Chronic idiopathic constipation: Secondary | ICD-10-CM

## 2020-02-15 MED ORDER — VITAMIN D (ERGOCALCIFEROL) 1.25 MG (50000 UNIT) PO CAPS: 50000.0000 [IU] | ORAL_CAPSULE | ORAL | 0 refills | Status: DC

## 2020-02-15 MED ORDER — METFORMIN HCL 500 MG PO TABS: 500.0000 mg | ORAL_TABLET | Freq: Every day | ORAL | 0 refills | Status: DC

## 2020-02-16 NOTE — Progress Notes (Signed)
Chief Complaint:   OBESITY Mikayla [name] is here to discuss her progress with her obesity treatment plan along with follow-up of her obesity related diagnoses. Mikayla [name] is on the Category 3 Plan and states she is following her eating plan approximately 99% of the time. Mikayla [name] states she is walking for 30 minutes 3 times per week.  Today's visit was #: 3 Starting weight: 262 lbs Starting date: 01/14/2020 Today's weight: 255 lbs Today's date: 02/15/2020 Total lbs lost to date: 7 Total lbs lost since last in-office visit: 4  Interim History: Mikayla [name] reports increased energy and decreased back pain. She reports difficulty consuming all of her dinner on the prescribed Category 3 meal plan. She has experienced increase in constipation. She reports her last colonoscopy was normal.  Subjective:   1. Vitamin D deficiency Mikayla [name]'s last Vit D level was 19.2 on 01/14/2020. She is currently on prescription Vit D supplementation.  2. Pre-diabetes Mikayla [name]'s last A1c was 5.9 on 01/14/2020. She is currently on metformin 500 mg q AM and is tolerating it well.  3. Chronic idiopathic constipation Mikayla [name] is using full capful of miralax. She was instructed to increase to miralax BID. I advised to increase water intake.  4. Other hyperlipidemia Mikayla [name]'s last lipid panel on 01/14/2020 showed a total cholesterol of 245 and LDL 164. She has restarted atorvastatin 10 mg q daily.   5. At risk for heart disease Mikayla [name] is at a higher than average risk for cardiovascular disease due to obesity and hyperlipidemia.   Assessment/Plan:   1. Vitamin D deficiency Low Vitamin D level contributes to fatigue and are associated with obesity, breast, and colon cancer. We will refill prescription Vitamin D for 1 month. Hudsyn will follow-up for routine testing of Vitamin D, at least 2-3 times per year to avoid over-replacement. We will recheck labs in May 2021.  - Vitamin D, Ergocalciferol, (DRISDOL) 1.25 MG (50000 UNIT) CAPS  capsule; Take 1 capsule (50,000 Units total) by mouth every 7 (seven) days.  Dispense: 4 capsule; Refill: 0  2. Pre-diabetes Mikayla [name] will continue her Category 3 meal plan, and will continue to work on weight loss, exercise, and decreasing simple carbohydrates to help decrease the risk of diabetes. We will refill metformin for 1 month. We will recheck labs in May 2021.  - metFORMIN (GLUCOPHAGE) 500 MG tablet; Take 1 tablet (500 mg total) by mouth daily with breakfast.  Dispense: 30 tablet; Refill: 0  3. Chronic idiopathic constipation We will refer to GI for increased constipation.  4. Other hyperlipidemia Cardiovascular risk and specific lipid/LDL goals reviewed. We discussed several lifestyle modifications today and Mikayla [name] will continue her current statin therapy, and will continue to work on diet, exercise and weight loss efforts. We will recheck labs in May 2021. Orders and follow up as documented in patient record.   5. At risk for heart disease Mikayla [name] was given approximately 30 minutes of coronary artery disease prevention counseling today. She is 54 y.o. female and has risk factors for heart disease including obesity and hyperlipidemia. We discussed intensive lifestyle modifications today with an emphasis on specific weight loss instructions and strategies.   Repetitive spaced learning was employed today to elicit superior memory formation and behavioral change.  6. Class 3 severe obesity with serious comorbidity and body mass index (BMI) of 45.0 to 49.9 in adult, unspecified obesity type (HCC) Mikayla [name] is currently in the action stage of change. As such, her goal is to continue with weight loss efforts. She has agreed  to the Category 3 Plan.   Exercise goals: As is.  Behavioral modification strategies: no skipping meals and meal planning and cooking strategies.  Mikayla [name] has agreed to follow-up with our clinic in 2 weeks. She was informed of the importance of frequent follow-up visits to  maximize her success with intensive lifestyle modifications for her multiple health conditions.   Objective:   Blood pressure 108/70, pulse 68, temperature 97.6 F (36.4 C), temperature source Oral, height 5\' 2"  (1.575 m), weight 255 lb (115.7 kg), SpO2 96 %. Body mass index is 46.64 kg/m.  General: Cooperative, alert, well developed, in no acute distress. HEENT: Conjunctivae and lids unremarkable. Cardiovascular: Regular rhythm.  Lungs: Normal work of breathing. Neurologic: No focal deficits.   Lab Results  Component Value Date   CREATININE 0.56 (L) 01/14/2020   BUN 17 01/14/2020   NA 140 01/14/2020   K 4.5 01/14/2020   CL 101 01/14/2020   CO2 23 01/14/2020   Lab Results  Component Value Date   ALT 12 01/14/2020   AST 14 01/14/2020   ALKPHOS 89 01/14/2020   BILITOT 0.3 01/14/2020   Lab Results  Component Value Date   HGBA1C 5.9 (H) 01/14/2020   HGBA1C 6.0 08/19/2019   HGBA1C 6.2 01/20/2019   HGBA1C 6.1 08/27/2018   HGBA1C 5.8 (H) 01/03/2018   Lab Results  Component Value Date   INSULIN 18.6 01/14/2020   Lab Results  Component Value Date   TSH 2.080 01/14/2020   Lab Results  Component Value Date   CHOL 245 (H) 01/14/2020   HDL 56 01/14/2020   LDLCALC 164 (H) 01/14/2020   LDLDIRECT 130.0 01/20/2019   TRIG 137 01/14/2020   CHOLHDL 4 08/19/2019   Lab Results  Component Value Date   WBC 5.3 01/14/2020   HGB 13.7 01/14/2020   HCT 40.2 01/14/2020   MCV 88 01/14/2020   PLT 198 01/14/2020   Lab Results  Component Value Date   IRON 89 01/20/2019   TIBC 315 01/20/2019   FERRITIN 109 01/20/2019   Attestation Statements:   Reviewed by clinician on day of visit: allergies, medications, problem list, medical history, surgical history, family history, social history, and previous encounter notes.   I, Trixie Dredge, am acting as transcriptionist for Dennard Nip, MD.  I have reviewed the above documentation for accuracy and completeness, and I agree with  the above. -

## 2020-02-16 NOTE — Addendum Note (Signed)
Addended by: Len Blalock on: 02/16/2020 12:56 PM   Modules accepted: Orders

## 2020-02-17 ENCOUNTER — Ambulatory Visit (INDEPENDENT_AMBULATORY_CARE_PROVIDER_SITE_OTHER): Payer: BC Managed Care – PPO | Admitting: Family Medicine

## 2020-02-21 ENCOUNTER — Other Ambulatory Visit (INDEPENDENT_AMBULATORY_CARE_PROVIDER_SITE_OTHER): Payer: Self-pay | Admitting: Family Medicine

## 2020-02-21 DIAGNOSIS — E559 Vitamin D deficiency, unspecified: Secondary | ICD-10-CM

## 2020-02-21 DIAGNOSIS — R7303 Prediabetes: Secondary | ICD-10-CM

## 2020-02-22 ENCOUNTER — Ambulatory Visit (INDEPENDENT_AMBULATORY_CARE_PROVIDER_SITE_OTHER): Payer: BC Managed Care – PPO | Admitting: Psychology

## 2020-02-22 ENCOUNTER — Other Ambulatory Visit: Payer: Self-pay

## 2020-02-22 ENCOUNTER — Ambulatory Visit: Payer: BC Managed Care – PPO | Attending: Internal Medicine

## 2020-02-22 DIAGNOSIS — F3289 Other specified depressive episodes: Secondary | ICD-10-CM | POA: Diagnosis not present

## 2020-02-22 DIAGNOSIS — Z23 Encounter for immunization: Secondary | ICD-10-CM

## 2020-02-22 NOTE — Progress Notes (Signed)
   Covid-19 Vaccination Clinic  Name:  Mikayla [name]    MRN: [id number] DOB: 05/28/1966  02/22/2020  [name] was observed post Covid-19 immunization for 15 minutes without incident. She was provided with Vaccine Information Sheet and instruction to access the V-Safe system.   [name] was instructed to call 911 with any severe reactions post vaccine: Marland Kitchen Difficulty breathing  . Swelling of face and throat  . A fast heartbeat  . A bad rash all over body  . Dizziness and weakness   Immunizations Administered    Name Date Dose VIS Date Route   Pfizer COVID-19 Vaccine 02/22/2020  3:17 PM 0.3 mL 10/30/2019 Intramuscular   Manufacturer: [employer], [employer]   Lot: [id number]   NDC: [address]-7

## 2020-02-23 ENCOUNTER — Encounter: Payer: Self-pay | Admitting: Gastroenterology

## 2020-03-02 ENCOUNTER — Ambulatory Visit (INDEPENDENT_AMBULATORY_CARE_PROVIDER_SITE_OTHER): Payer: BC Managed Care – PPO | Admitting: Family Medicine

## 2020-03-02 ENCOUNTER — Other Ambulatory Visit: Payer: Self-pay

## 2020-03-02 ENCOUNTER — Encounter (INDEPENDENT_AMBULATORY_CARE_PROVIDER_SITE_OTHER): Payer: Self-pay | Admitting: Family Medicine

## 2020-03-02 VITALS — BP 104/70 | HR 81 | Temp 98.3°F | Ht 62.0 in | Wt 255.0 lb

## 2020-03-02 DIAGNOSIS — R7303 Prediabetes: Secondary | ICD-10-CM | POA: Diagnosis not present

## 2020-03-02 DIAGNOSIS — M5431 Sciatica, right side: Secondary | ICD-10-CM

## 2020-03-02 DIAGNOSIS — K5909 Other constipation: Secondary | ICD-10-CM | POA: Diagnosis not present

## 2020-03-02 DIAGNOSIS — Z6841 Body Mass Index (BMI) 40.0 and over, adult: Secondary | ICD-10-CM

## 2020-03-02 DIAGNOSIS — E559 Vitamin D deficiency, unspecified: Secondary | ICD-10-CM

## 2020-03-02 DIAGNOSIS — Z9189 Other specified personal risk factors, not elsewhere classified: Secondary | ICD-10-CM | POA: Diagnosis not present

## 2020-03-02 MED ORDER — DICLOFENAC SODIUM 75 MG PO TBEC: 75.0000 mg | DELAYED_RELEASE_TABLET | Freq: Two times a day (BID) | ORAL | 0 refills | Status: DC

## 2020-03-02 NOTE — Progress Notes (Signed)
Chief Complaint:   OBESITY Mikayla [name] is here to discuss her progress with her obesity treatment plan along with follow-up of her obesity related diagnoses. Mikayla [name] is on the Category 3 Plan and states she is following her eating plan approximately 95% of the time. Mikayla [name] states she is walking and lifting weights for 30-45 minutes 3 times per week.  Today's visit was #: 4 Starting weight: 262 lbs Starting date: 01/14/2020 Today's weight: 255 lbs Today's date: 03/02/2020 Total lbs lost to date: 7 lbs Total lbs lost since last in-office visit: 0  Interim History: Since the last visit, Mikayla [name] has started taking metformin 500 mg daily.  She also began taking atorvastatin.  She endorses constipation.  Mikayla [name] provided the following food recall today:  Breakfast:  Egg and protein. Snack:  Yogurt. Lunch:  Dinner/sandwich. Dinner:  With family:  Meat/vegetables. Snack:  [place]asso bar.  Subjective:   1. Prediabetes Mikayla [name] has a diagnosis of prediabetes based on her elevated HgA1c and was informed this puts her at greater risk of developing diabetes. She continues to work on diet and exercise to decrease her risk of diabetes. She denies nausea or hypoglycemia.  She is taking metformin 500 mg daily.  Lab Results  Component Value Date   HGBA1C 5.9 (H) 01/14/2020   Lab Results  Component Value Date   INSULIN 18.6 01/14/2020   2. Vitamin D deficiency Mikayla [name]'s Vitamin D level was 19.2 on 01/14/2020. She is currently taking prescription vitamin D 50,000 IU each week. She denies nausea, vomiting or muscle weakness.  3. Other constipation Mikayla [name] notes constipation.   4. Right sided sciatica Mikayla [name] reports that she had an injection last month.  Assessment/Plan:   1. Prediabetes Mikayla [name] will continue to work on weight loss, exercise, and decreasing simple carbohydrates to help decrease the risk of diabetes.   2. Vitamin D deficiency Low Vitamin D level contributes to fatigue and are  associated with obesity, breast, and colon cancer. She agrees to continue to take prescription Vitamin D @50 ,000 IU every week and will follow-up for routine testing of Vitamin D, at least 2-3 times per year to avoid over-replacement.  3. Other constipation Reviewed bowel regimen.  Mikayla [name] was informed that a decrease in bowel movement frequency is normal while losing weight, but stools should not be hard or painful. Orders and follow up as documented in patient record.   Counseling Getting to Good Bowel Health: Your goal is to have one soft bowel movement each day. Drink at least 8 glasses of water each day. Eat plenty of fiber (goal is over 25 grams each day). It is best to get most of your fiber from dietary sources which includes leafy green vegetables, fresh fruit, and whole grains. You may need to add fiber with the help of OTC fiber supplements. These include Metamucil, Citrucel, and Flaxseed. If you are still having trouble, try adding Miralax or Magnesium Citrate. If all of these changes do not work, Cabin crew.  4. Right sided sciatica Mikayla [name] will call for PT since already offered by PCP.  Orders - diclofenac (VOLTAREN) 75 MG EC tablet; Take 1 tablet (75 mg total) by mouth 2 (two) times daily.  Dispense: 60 tablet; Refill: 0  5. At risk for deficient intake of food Mikayla [name] was given approximately 15 minutes of deficit intake of food prevention counseling today. Mikayla [name] is at risk for eating too few calories based on current food recall. She was encouraged to focus on meeting caloric and  protein goals according to her recommended meal plan.   6. Class 3 severe obesity with serious comorbidity and body mass index (BMI) of 45.0 to 49.9 in adult, unspecified obesity type (HCC) Mikayla [name] is currently in the action stage of change. As such, her goal is to continue with weight loss efforts. She has agreed to keeping a food journal and adhering to recommended goals of 1200-1300 calories and  100+ grams of protein.   Exercise goals: PT.  Behavioral modification strategies: increasing lean protein intake and increasing water intake.  Mikayla [name] has agreed to follow-up with our clinic in 2 weeks. She was informed of the importance of frequent follow-up visits to maximize her success with intensive lifestyle modifications for her multiple health conditions.   Objective:   Blood pressure 104/70, pulse 81, temperature 98.3 F (36.8 C), temperature source Oral, height 5\' 2"  (1.575 m), weight 255 lb (115.7 kg), SpO2 96 %. Body mass index is 46.64 kg/m.  General: Cooperative, alert, well developed, in no acute distress. HEENT: Conjunctivae and lids unremarkable. Cardiovascular: Regular rhythm.  Lungs: Normal work of breathing. Neurologic: No focal deficits.   Lab Results  Component Value Date   CREATININE 0.56 (L) 01/14/2020   BUN 17 01/14/2020   NA 140 01/14/2020   K 4.5 01/14/2020   CL 101 01/14/2020   CO2 23 01/14/2020   Lab Results  Component Value Date   ALT 12 01/14/2020   AST 14 01/14/2020   ALKPHOS 89 01/14/2020   BILITOT 0.3 01/14/2020   Lab Results  Component Value Date   HGBA1C 5.9 (H) 01/14/2020   HGBA1C 6.0 08/19/2019   HGBA1C 6.2 01/20/2019   HGBA1C 6.1 08/27/2018   HGBA1C 5.8 (H) 01/03/2018   Lab Results  Component Value Date   INSULIN 18.6 01/14/2020   Lab Results  Component Value Date   TSH 2.080 01/14/2020   Lab Results  Component Value Date   CHOL 245 (H) 01/14/2020   HDL 56 01/14/2020   LDLCALC 164 (H) 01/14/2020   LDLDIRECT 130.0 01/20/2019   TRIG 137 01/14/2020   CHOLHDL 4 08/19/2019   Lab Results  Component Value Date   WBC 5.3 01/14/2020   HGB 13.7 01/14/2020   HCT 40.2 01/14/2020   MCV 88 01/14/2020   PLT 198 01/14/2020   Lab Results  Component Value Date   IRON 89 01/20/2019   TIBC 315 01/20/2019   FERRITIN 109 01/20/2019   Attestation Statements:   Reviewed by clinician on day of visit: allergies, medications,  problem list, medical history, surgical history, family history, social history, and previous encounter notes.  I, 03/22/2019, CMA, am acting as Insurance claims handler for Energy manager, DO.  I have reviewed the above documentation for accuracy and completeness, and I agree with the above. [employer], DO

## 2020-03-07 NOTE — Progress Notes (Unsigned)
Office: [phone]  /  Fax: [phone]    Date: March 17, 2020   Appointment Start Time: *** Duration: *** minutes Provider: Lawerance Cruel, Psy.D. Type of Session: Individual Therapy  Location of Patient: {gbptloc:23249} Location of Provider: Provider's Home Type of Contact: Telepsychological Visit via MyChart Video Visit  Session Content: Mikayla [name] is a 54 y.o. female presenting via MyChart Video Visit for a follow-up appointment to address the previously established treatment goal of decreasing emotional eating. Today's appointment was a telepsychological visit due to COVID-19. Mikayla [name] provided verbal consent for today's telepsychological appointment and she is aware she is responsible for securing confidentiality on her end of the session. Prior to proceeding with today's appointment, Mikayla [name]'s physical location at the time of this appointment was obtained as well a phone number she could be reached at in the event of technical difficulties. Mikayla [name] and this provider participated in today's telepsychological service.   This provider conducted a brief check-in and verbally administered the PHQ-9 and GAD-7. *** Mikayla [name] was receptive to today's appointment as evidenced by openness to sharing, responsiveness to feedback, and {gbreceptiveness:23401}.  Mental Status Examination:  Appearance: {Appearance:22431} Behavior: {Behavior:22445} Mood: {gbmood:21757} Affect: {Affect:22436} Speech: {Speech:22432} Eye Contact: {Eye Contact:22433} Psychomotor Activity: {Motor Activity:22434} Gait: {gbgait:23404} Thought Process: {thought process:22448}  Thought Content/Perception: {disturbances:22451} Orientation: {Orientation:22437} Memory/Concentration: {gbcognition:22449} Insight/Judgment: {Insight:22446}  Structured Assessments Results: The Patient Health Questionnaire-9 (PHQ-9) is a self-report measure that assesses symptoms and severity of depression over the course of the last two weeks. Mikayla [name]  obtained a score of *** suggesting {GBPHQ9SEVERITY:21752}. Mikayla [name] finds the endorsed symptoms to be {gbphq9difficulty:21754}. [0= Not at all; 1= Several days; 2= More than half the days; 3= Nearly every day] Little interest or pleasure in doing things ***  Feeling down, depressed, or hopeless ***  Trouble falling or staying asleep, or sleeping too much ***  Feeling tired or having little energy ***  Poor appetite or overeating ***  Feeling bad about yourself --- or that you are a failure or have let yourself or your family down ***  Trouble concentrating on things, such as reading the newspaper or watching television ***  Moving or speaking so slowly that other people could have noticed? Or the opposite --- being so fidgety or restless that you have been moving around a lot more than usual ***  Thoughts that you would be better off dead or hurting yourself in some way ***  PHQ-9 Score ***    The Generalized Anxiety Disorder-7 (GAD-7) is a brief self-report measure that assesses symptoms of anxiety over the course of the last two weeks. Mikayla [name] obtained a score of *** suggesting {gbgad7severity:21753}. Mikayla [name] finds the endorsed symptoms to be {gbphq9difficulty:21754}. [0= Not at all; 1= Several days; 2= Over half the days; 3= Nearly every day] Feeling nervous, anxious, on edge ***  Not being able to stop or control worrying ***  Worrying too much about different things ***  Trouble relaxing ***  Being so restless that it's hard to sit still ***  Becoming easily annoyed or irritable ***  Feeling afraid as if something awful might happen ***  GAD-7 Score ***   Interventions:  {Interventions for Progress Notes:23405}  DSM-5 Diagnosis(es): 311 (F32.8) Other Specified Depressive Disorder, Emotional Eating Behaviors  Treatment Goal & Progress: During the initial appointment with this provider, the following treatment goal was established: decrease emotional eating. Mikayla [name] has demonstrated  progress in her goal as evidenced by {gbtxprogress:22839}. Mikayla [name] also {gbtxprogress2:22951}.  Plan: The next appointment will be scheduled in {  gbweeks:21758}, which will be {gbtxmodality:23402}. The next session will focus on {Plan for Next Appointment:23400}.

## 2020-03-11 ENCOUNTER — Telehealth (INDEPENDENT_AMBULATORY_CARE_PROVIDER_SITE_OTHER): Payer: BC Managed Care – PPO | Admitting: Family Medicine

## 2020-03-11 ENCOUNTER — Other Ambulatory Visit: Payer: Self-pay

## 2020-03-11 ENCOUNTER — Encounter: Payer: Self-pay | Admitting: Family Medicine

## 2020-03-11 VITALS — BP 102/68 | HR 76 | Temp 97.5°F | Wt 260.0 lb

## 2020-03-11 DIAGNOSIS — H65191 Other acute nonsuppurative otitis media, right ear: Secondary | ICD-10-CM | POA: Diagnosis not present

## 2020-03-11 DIAGNOSIS — R300 Dysuria: Secondary | ICD-10-CM | POA: Diagnosis not present

## 2020-03-11 LAB — POC URINALSYSI DIPSTICK (AUTOMATED)
Bilirubin, UA: NEGATIVE
Blood, UA: NEGATIVE
Glucose, UA: NEGATIVE
Ketones, UA: NEGATIVE
Leukocytes, UA: NEGATIVE
Nitrite, UA: NEGATIVE
Protein, UA: NEGATIVE
Spec Grav, UA: >=1.03 — AB (ref 1.010–1.025)
Urobilinogen, UA: 0.2 E.U./dL
pH, UA: 5.5 (ref 5.0–8.0)

## 2020-03-11 MED ORDER — AMOXICILLIN 500 MG PO TABS: 500.0000 mg | ORAL_TABLET | Freq: Two times a day (BID) | ORAL | 0 refills | Status: AC

## 2020-03-11 NOTE — Patient Instructions (Signed)
Otitis Media, Adult  Otitis media means that the middle ear is red and swollen (inflamed) and full of fluid. The condition usually goes away on its own. Follow these instructions at home:  Take over-the-counter and prescription medicines only as told by your doctor.  If you were prescribed an antibiotic medicine, take it as told by your doctor. Do not stop taking the antibiotic even if you start to feel better.  Keep all follow-up visits as told by your doctor. This is important. Contact a doctor if:  You have bleeding from your nose.  There is a lump on your neck.  You are not getting better in 5 days.  You feel worse instead of better. Get help right away if:  You have pain that is not helped with medicine.  You have swelling, redness, or pain around your ear.  You get a stiff neck.  You cannot move part of your face (paralyzed).  You notice that the bone behind your ear hurts when you touch it.  You get a very bad headache. Summary  Otitis media means that the middle ear is red, swollen, and full of fluid.  This condition usually goes away on its own. In some cases, treatment may be needed.  If you were prescribed an antibiotic medicine, take it as told by your doctor. This information is not intended to replace advice given to you by your health care provider. Make sure you discuss any questions you have with your health care provider. Document Revised: 10/18/2017 Document Reviewed: 11/26/2016 Elsevier Patient Education  2020 [employer]  Dysuria Dysuria is pain or discomfort while urinating. The pain or discomfort may be felt in the part of your body that drains urine from the bladder (urethra) or in the surrounding tissue of the genitals. The pain may also be felt in the groin area, lower abdomen, or lower back. You may have to urinate frequently or have the sudden feeling that you have to urinate (urgency). Dysuria can affect both men and women, but it is more  common in women. Dysuria can be caused by many different things, including:  Urinary tract infection.  Kidney stones or bladder stones.  Certain sexually transmitted infections (STIs), such as chlamydia.  Dehydration.  Inflammation of the tissues of the vagina.  Use of certain medicines.  Use of certain soaps or scented products that cause irritation. Follow these instructions at home: General instructions  Watch your condition for any changes.  Urinate often. Avoid holding urine for long periods of time.  After a bowel movement or urination, women should cleanse from front to back, using each tissue only once.  Urinate after sexual intercourse.  Keep all follow-up visits as told by your health care provider. This is important.  If you had any tests done to find the cause of dysuria, it is up to you to get your test results. Ask your health care provider, or the department that is doing the test, when your results will be ready. Eating and drinking   Drink enough fluid to keep your urine pale yellow.  Avoid caffeine, tea, and alcohol. They can irritate the bladder and make dysuria worse. In men, alcohol may irritate the prostate. Medicines  Take over-the-counter and prescription medicines only as told by your health care provider.  If you were prescribed an antibiotic medicine, take it as told by your health care provider. Do not stop taking the antibiotic even if you start to feel better. Contact a health care  provider if:  You have a fever.  You develop pain in your back or sides.  You have nausea or vomiting.  You have blood in your urine.  You are not urinating as often as you usually do. Get help right away if:  Your pain is severe and not relieved with medicines.  You cannot eat or drink without vomiting.  You are confused.  You have a rapid heartbeat while at rest.  You have shaking or chills.  You feel extremely weak. Summary  Dysuria is pain  or discomfort while urinating. Many different conditions can lead to dysuria.  If you have dysuria, you may have to urinate frequently or have the sudden feeling that you have to urinate (urgency).  Watch your condition for any changes. Keep all follow-up visits as told by your health care provider.  Make sure that you urinate often and drink enough fluid to keep your urine pale yellow. This information is not intended to replace advice given to you by your health care provider. Make sure you discuss any questions you have with your health care provider. Document Revised: 10/18/2017 Document Reviewed: 08/22/2017 Elsevier Patient Education  [employer].
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
## 2020-03-17 ENCOUNTER — Telehealth (INDEPENDENT_AMBULATORY_CARE_PROVIDER_SITE_OTHER): Payer: Self-pay | Admitting: Psychology

## 2020-03-21 ENCOUNTER — Other Ambulatory Visit (INDEPENDENT_AMBULATORY_CARE_PROVIDER_SITE_OTHER): Payer: Self-pay | Admitting: Family Medicine

## 2020-03-21 DIAGNOSIS — E559 Vitamin D deficiency, unspecified: Secondary | ICD-10-CM

## 2020-03-23 ENCOUNTER — Other Ambulatory Visit (INDEPENDENT_AMBULATORY_CARE_PROVIDER_SITE_OTHER): Payer: Self-pay | Admitting: Family Medicine

## 2020-03-23 DIAGNOSIS — M5431 Sciatica, right side: Secondary | ICD-10-CM

## 2020-03-23 DIAGNOSIS — R7303 Prediabetes: Secondary | ICD-10-CM

## 2020-03-24 ENCOUNTER — Ambulatory Visit (INDEPENDENT_AMBULATORY_CARE_PROVIDER_SITE_OTHER): Payer: BC Managed Care – PPO | Admitting: Family Medicine

## 2020-03-31 ENCOUNTER — Ambulatory Visit: Payer: BC Managed Care – PPO | Admitting: Gastroenterology

## 2020-04-13 ENCOUNTER — Other Ambulatory Visit (INDEPENDENT_AMBULATORY_CARE_PROVIDER_SITE_OTHER): Payer: Self-pay | Admitting: Family Medicine

## 2020-04-13 ENCOUNTER — Other Ambulatory Visit: Payer: Self-pay

## 2020-04-13 ENCOUNTER — Ambulatory Visit (INDEPENDENT_AMBULATORY_CARE_PROVIDER_SITE_OTHER): Payer: BC Managed Care – PPO | Admitting: Family Medicine

## 2020-04-13 ENCOUNTER — Encounter (INDEPENDENT_AMBULATORY_CARE_PROVIDER_SITE_OTHER): Payer: Self-pay | Admitting: Family Medicine

## 2020-04-13 VITALS — BP 105/69 | HR 73 | Temp 98.0°F | Ht 62.0 in | Wt 261.0 lb

## 2020-04-13 DIAGNOSIS — E559 Vitamin D deficiency, unspecified: Secondary | ICD-10-CM

## 2020-04-13 DIAGNOSIS — R609 Edema, unspecified: Secondary | ICD-10-CM

## 2020-04-13 DIAGNOSIS — Z6841 Body Mass Index (BMI) 40.0 and over, adult: Secondary | ICD-10-CM

## 2020-04-13 DIAGNOSIS — M5431 Sciatica, right side: Secondary | ICD-10-CM

## 2020-04-13 DIAGNOSIS — R632 Polyphagia: Secondary | ICD-10-CM | POA: Diagnosis not present

## 2020-04-13 DIAGNOSIS — R7303 Prediabetes: Secondary | ICD-10-CM | POA: Diagnosis not present

## 2020-04-13 DIAGNOSIS — Z9189 Other specified personal risk factors, not elsewhere classified: Secondary | ICD-10-CM | POA: Diagnosis not present

## 2020-04-13 MED ORDER — VITAMIN D (ERGOCALCIFEROL) 1.25 MG (50000 UNIT) PO CAPS: 50000.0000 [IU] | ORAL_CAPSULE | ORAL | 0 refills | Status: DC

## 2020-04-13 MED ORDER — PHENTERMINE HCL 15 MG PO CAPS: 15.0000 mg | ORAL_CAPSULE | ORAL | 0 refills | Status: DC

## 2020-04-13 MED ORDER — SAXENDA 18 MG/3ML ~~LOC~~ SOPN: 3.0000 mg | PEN_INJECTOR | Freq: Every day | SUBCUTANEOUS | 0 refills | Status: DC

## 2020-04-13 MED ORDER — FUROSEMIDE 20 MG PO TABS: 20.0000 mg | ORAL_TABLET | Freq: Every day | ORAL | 0 refills | Status: DC | PRN

## 2020-04-13 NOTE — Progress Notes (Signed)
Chief Complaint:   OBESITY Mikayla [name] is here to discuss her progress with her obesity treatment plan along with follow-up of her obesity related diagnoses. Mikayla [name] is on the Category 3 Plan and states she is following her eating plan approximately 0% of the time. Mikayla [name] states she is walking for 30+ minutes 3 times per week.  Today's visit was #: 5 Starting weight: 262 lbs Starting date: 01/14/2020 Today's weight: 261 lbs Today's date: 04/13/2020 Total lbs lost to date: 1 lb Total lbs lost since last in-office visit: 0  Interim History: Mikayla [name] says her back is still hurting.  The diclofenac is helping but the pain is worsening.  She has an appointment with Orthopedics next Wednesay.  She says she is frustrated with her lack of weight loss.  Her daughter came for a visit for 10 days last month.  Subjective:   1. Vitamin D deficiency Mikayla [name]'s Vitamin D level was 19.2 on 01/14/2020. She is currently taking prescription vitamin D 50,000 IU each week. She denies nausea, vomiting or muscle weakness.  2. Prediabetes Mikayla [name] has a diagnosis of prediabetes based on her elevated HgA1c and was informed this puts her at greater risk of developing diabetes. She continues to work on diet and exercise to decrease her risk of diabetes. She denies nausea or hypoglycemia.    Lab Results  Component Value Date   HGBA1C 5.9 (H) 01/14/2020   Lab Results  Component Value Date   INSULIN 18.6 01/14/2020   3. Polyphagia Mikayla [name] endorses excessive hunger.   4. Edema, unspecified type Mikayla [name] has some generalized edema.  5. At risk for constipation Mikayla [name] is at increased risk for constipation due to inadequate water intake, changes in diet, and/or use of medications such as GLP1 agonists. Mikayla [name] denies hard, infrequent stools currently.   Assessment/Plan:   1. Vitamin D deficiency Low Vitamin D level contributes to fatigue and are associated with obesity, breast, and colon cancer. She agrees to continue to  take prescription Vitamin D @50 ,000 IU every week and will follow-up for routine testing of Vitamin D, at least 2-3 times per year to avoid over-replacement.  Orders - Vitamin D, Ergocalciferol, (DRISDOL) 1.25 MG (50000 UNIT) CAPS capsule; Take 1 capsule (50,000 Units total) by mouth every 7 (seven) days.  Dispense: 4 capsule; Refill: 0  2. Prediabetes Mikayla [name] will continue to work on weight loss, exercise, and decreasing simple carbohydrates to help decrease the risk of diabetes.   Orders - Liraglutide -Weight Management (SAXENDA) 18 MG/3ML SOPN; Inject 0.5 mLs (3 mg total) into the skin daily.  Dispense: 5 pen; Refill: 0  3. Polyphagia Intensive lifestyle modifications are the first line treatment for this issue. We discussed several lifestyle modifications today and she will continue to work on diet, exercise and weight loss efforts. Orders and follow up as documented in patient record.  Counseling . Polyphagia is excessive hunger. . Causes can include: low blood sugars, hypERthyroidism, PMS, lack of sleep, stress, insulin resistance, diabetes, certain medications, and diets that are deficient in protein and fiber.   Orders - phentermine 15 MG capsule; Take 1 capsule (15 mg total) by mouth every morning.  Dispense: 30 capsule; Refill: 0  4. Edema, unspecified type Will send in Lasix for Mikayla [name] to take as needed for edema.  Orders - furosemide (LASIX) 20 MG tablet; Take 1 tablet (20 mg total) by mouth daily as needed for edema.  Dispense: 30 tablet; Refill: 0  5. At risk for constipation Mikayla [name] was informed that a decrease  in bowel movement frequency is normal while losing weight, but stools should not be hard or painful. Orders and follow up as documented in patient record.   Counseling Getting to Good Bowel Health: Your goal is to have one soft bowel movement each day. Drink at least 8 glasses of water each day. Eat plenty of fiber (goal is over 25 grams each day). It is best to get  most of your fiber from dietary sources which includes leafy green vegetables, fresh fruit, and whole grains. You may need to add fiber with the help of OTC fiber supplements. These include Metamucil, Citrucel, and Flaxseed. If you are still having trouble, try adding Miralax or Magnesium Citrate. If all of these changes do not work, Cabin crew.  6. Class 3 severe obesity with serious comorbidity and body mass index (BMI) of 45.0 to 49.9 in adult, unspecified obesity type (HCC) Mikayla [name] is currently in the action stage of change. As such, her goal is to continue with weight loss efforts. She has agreed to the Category 3 Plan.   Exercise goals: For substantial health benefits, adults should do at least 150 minutes (2 hours and 30 minutes) a week of moderate-intensity, or 75 minutes (1 hour and 15 minutes) a week of vigorous-intensity aerobic physical activity, or an equivalent combination of moderate- and vigorous-intensity aerobic activity. Aerobic activity should be performed in episodes of at least 10 minutes, and preferably, it should be spread throughout the week.  Behavioral modification strategies: increasing lean protein intake, decreasing simple carbohydrates, increasing vegetables and increasing water intake.  Mikayla [name] has agreed to follow-up with our clinic in 2 weeks. She was informed of the importance of frequent follow-up visits to maximize her success with intensive lifestyle modifications for her multiple health conditions.   Objective:   Blood pressure 105/69, pulse 73, temperature 98 F (36.7 C), temperature source Oral, height 5\' 2"  (1.575 m), weight 261 lb (118.4 kg), SpO2 96 %. Body mass index is 47.74 kg/m.  General: Cooperative, alert, well developed, in no acute distress. HEENT: Conjunctivae and lids unremarkable. Cardiovascular: Regular rhythm.  Lungs: Normal work of breathing. Neurologic: No focal deficits.   Lab Results  Component Value Date   CREATININE  0.56 (L) 01/14/2020   BUN 17 01/14/2020   NA 140 01/14/2020   K 4.5 01/14/2020   CL 101 01/14/2020   CO2 23 01/14/2020   Lab Results  Component Value Date   ALT 12 01/14/2020   AST 14 01/14/2020   ALKPHOS 89 01/14/2020   BILITOT 0.3 01/14/2020   Lab Results  Component Value Date   HGBA1C 5.9 (H) 01/14/2020   HGBA1C 6.0 08/19/2019   HGBA1C 6.2 01/20/2019   HGBA1C 6.1 08/27/2018   HGBA1C 5.8 (H) 01/03/2018   Lab Results  Component Value Date   INSULIN 18.6 01/14/2020   Lab Results  Component Value Date   TSH 2.080 01/14/2020   Lab Results  Component Value Date   CHOL 245 (H) 01/14/2020   HDL 56 01/14/2020   LDLCALC 164 (H) 01/14/2020   LDLDIRECT 130.0 01/20/2019   TRIG 137 01/14/2020   CHOLHDL 4 08/19/2019   Lab Results  Component Value Date   WBC 5.3 01/14/2020   HGB 13.7 01/14/2020   HCT 40.2 01/14/2020   MCV 88 01/14/2020   PLT 198 01/14/2020   Lab Results  Component Value Date   IRON 89 01/20/2019   TIBC 315 01/20/2019   FERRITIN 109 01/20/2019   Attestation Statements:  Reviewed by clinician on day of visit: allergies, medications, problem list, medical history, surgical history, family history, social history, and previous encounter notes.  I, Water quality scientist, CMA, am acting as Location manager for [employer], DO.  I have reviewed the above documentation for accuracy and completeness, and I agree with the above. Briscoe Deutscher, DO

## 2020-04-14 ENCOUNTER — Other Ambulatory Visit (INDEPENDENT_AMBULATORY_CARE_PROVIDER_SITE_OTHER): Payer: Self-pay | Admitting: Family Medicine

## 2020-04-14 DIAGNOSIS — M5431 Sciatica, right side: Secondary | ICD-10-CM

## 2020-04-19 MED ORDER — DICLOFENAC SODIUM 75 MG PO TBEC: 75.0000 mg | DELAYED_RELEASE_TABLET | Freq: Two times a day (BID) | ORAL | 0 refills | Status: DC

## 2020-04-20 ENCOUNTER — Ambulatory Visit: Payer: BC Managed Care – PPO | Admitting: Family Medicine

## 2020-04-20 ENCOUNTER — Encounter: Payer: Self-pay | Admitting: Family Medicine

## 2020-04-20 ENCOUNTER — Other Ambulatory Visit: Payer: Self-pay

## 2020-04-20 DIAGNOSIS — M5431 Sciatica, right side: Secondary | ICD-10-CM | POA: Diagnosis not present

## 2020-04-20 MED ORDER — PREGABALIN 75 MG PO CAPS: 75.0000 mg | ORAL_CAPSULE | Freq: Two times a day (BID) | ORAL | 3 refills | Status: DC

## 2020-04-20 MED ORDER — DICLOFENAC SODIUM 75 MG PO TBEC: 75.0000 mg | DELAYED_RELEASE_TABLET | Freq: Two times a day (BID) | ORAL | 0 refills | Status: DC

## 2020-04-20 NOTE — Progress Notes (Signed)
Subjective:    CC: Low back pain  I, Molly Weber, LAT, ATC, am serving as scribe for Dr. Lynne Leader.  HPI: Pt is a 54 y/o female presenting w/ c/o chronic low back pain.  She locates her pain to bilateral low back.  Additionally she has pain radiating down predominantly the right leg posterior aspect of the leg but sometimes the right anterior medial lower leg and sometimes the left leg..  She rates her pain as and describes her pain as moderate to severe typically worse with activity..  Pain has been ongoing for years.  She previously was under the care of Dr. Alfonso Ramus at [facility] where she had several injections.  She describes epidural steroid injections that did provide some initial pain relief but ultimately did not help after 1 or 2 shots.  It has been about a year since her last injection.  She denies any weakness or numbness distally or bowel bladder dysfunction.  She notes that she is tried courses of prednisone in the past which helped a little.  She currently takes diclofenac intermittently which helps some.  She denies any history of gabapentin or Lyrica.  Diagnostic testing: L-spine MRI- 01/03/19  Pertinent review of Systems: No fevers or chills  Relevant historical information: No back surgery history.   Objective:   Weight equals 263 pounds General: Well Developed, well nourished, and in no acute distress.   MSK: L-spine normal-appearing nontender decreased lumbar motion. Lower extremity strength is intact normal gait.  Lab and Radiology Results EXAM: MRI LUMBAR SPINE WITHOUT CONTRAST  TECHNIQUE: Multiplanar, multisequence MR imaging of the lumbar spine was performed. No intravenous contrast was administered.  COMPARISON:  None available.  FINDINGS: Segmentation: Standard. Lowest well-formed disc labeled the L5-S1 level.  Alignment: 3 mm anterolisthesis of L4 on L5 and L5 on S1. Alignment otherwise normal with preservation of the  normal lumbar lordosis.  Vertebrae: Vertebral body heights maintained without evidence for acute or chronic fracture. Bone marrow signal intensity within normal limits. Prominent hemangioma noted within the T10 vertebral body. No other discrete or worrisome osseous lesions. No abnormal marrow edema.  Conus medullaris and cauda equina: Conus extends to the L1 level. Conus and cauda equina appear normal.  Paraspinal and other soft tissues: Paraspinous soft tissues within normal limits. Visualized visceral structures unremarkable.  Disc levels:  T11-12: Mild annular disc bulge.  No stenosis.  T12-L1: Unremarkable.  L1-2:  Unremarkable.  L2-3: Negative interspace. Mild bilateral facet hypertrophy. No stenosis.  L3-4: Mild diffuse disc bulge with disc desiccation and intervertebral disc space narrowing. Mild bilateral facet hypertrophy. No significant canal or foraminal stenosis.  L4-5: 3 mm anterolisthesis. Associated mild diffuse disc bulge. Advanced bilateral facet arthrosis. No significant canal or lateral recess stenosis. Foramina remain patent.  L5-S1: 3 mm anterolisthesis. Associated shallow posterior disc bulge. There is a superimposed tiny right central/subarticular disc protrusion (series 6, image 8). Protruding disc appears to contact the descending right S1 nerve root which is minimally displaced posteriorly (series 11, image 37). Superimposed advanced bilateral facet arthrosis. Mild epidural lipomatosis. No significant canal or lateral recess stenosis. Foramina remain patent.  IMPRESSION: 1. Tiny right central/subarticular disc protrusion at L5-S1, contacting and potentially affecting the descending right S1 nerve root. 2. 3 mm anterolisthesis of L4 on L5 and L5 on S1 with associated advanced bilateral facet arthropathy. No significant stenosis at these levels. 3. Mild noncompressive disc bulging at L3-4 without significant stenosis or neural  impingement.   Electronically  Signed   By: Rise Mu M.D.   On: 01/05/2019 07:06 I, [name], personally (independently) visualized and performed the interpretation of the images attached in this note.    Impression and Recommendations:    Assessment and Plan: 54 y.o. female with back pain and radiculopathy. Patient's biggest issue is her radiculopathy.  She notes the most typical symptom is posterior right leg pain which is consistent with right S1 nerve root impingement.  She also describes some right L4 dermatomal radiculopathy pattern as well. Plan: We will obtain medical records [facility].  This will be very helpful and planning for next steps. Also will start Lyrica which has not had a trial of yet which may be helpful.  We will check back in a month.  At that point will have medical records likely will proceed with repeat trial of steroid injection followed by MRI if not improved.Marland Kitchen  PDMP not reviewed this encounter. No orders of the defined types were placed in this encounter.  Meds ordered this encounter  Medications  . pregabalin (LYRICA) 75 MG capsule    Sig: Take 1 capsule (75 mg total) by mouth 2 (two) times daily.    Dispense:  60 capsule    Refill:  3  . diclofenac (VOLTAREN) 75 MG EC tablet    Sig: Take 1 tablet (75 mg total) by mouth 2 (two) times daily.    Dispense:  60 tablet    Refill:  0    Discussed warning signs or symptoms. Please see discharge instructions. Patient expresses understanding.   The above documentation has been reviewed and is accurate and complete [name], M.D.

## 2020-04-20 NOTE — Patient Instructions (Signed)
Thank you for coming in today. Plan for medical records from [facility].  Try adding lyrica.  Let me know if your insurance gets fussy with lyrica.  Will use [email or web address]  Recheck in about 1 month.  Keep me updated.

## 2020-04-27 ENCOUNTER — Encounter (INDEPENDENT_AMBULATORY_CARE_PROVIDER_SITE_OTHER): Payer: Self-pay | Admitting: Family Medicine

## 2020-04-28 NOTE — Telephone Encounter (Signed)
The saxenda was denied by her insurance.  Any other suggestions?

## 2020-05-04 ENCOUNTER — Telehealth: Payer: Self-pay | Admitting: Family Medicine

## 2020-05-04 ENCOUNTER — Other Ambulatory Visit (INDEPENDENT_AMBULATORY_CARE_PROVIDER_SITE_OTHER): Payer: Self-pay | Admitting: Family Medicine

## 2020-05-04 DIAGNOSIS — E559 Vitamin D deficiency, unspecified: Secondary | ICD-10-CM

## 2020-05-04 NOTE — Telephone Encounter (Signed)
Received medical records from [facility].  Patient had bilateral S1 transforaminal epidural steroid injections on September 25, 2019

## 2020-05-06 ENCOUNTER — Other Ambulatory Visit (INDEPENDENT_AMBULATORY_CARE_PROVIDER_SITE_OTHER): Payer: Self-pay | Admitting: Family Medicine

## 2020-05-06 DIAGNOSIS — R609 Edema, unspecified: Secondary | ICD-10-CM

## 2020-05-09 ENCOUNTER — Ambulatory Visit (INDEPENDENT_AMBULATORY_CARE_PROVIDER_SITE_OTHER): Payer: BC Managed Care – PPO | Admitting: Family Medicine

## 2020-05-10 ENCOUNTER — Ambulatory Visit: Payer: BC Managed Care – PPO | Admitting: Gastroenterology

## 2020-05-10 ENCOUNTER — Encounter: Payer: Self-pay | Admitting: Gastroenterology

## 2020-05-10 VITALS — BP 104/68 | HR 72 | Ht 62.0 in | Wt 266.4 lb

## 2020-05-10 DIAGNOSIS — K5900 Constipation, unspecified: Secondary | ICD-10-CM

## 2020-05-10 MED ORDER — LUBIPROSTONE 24 MCG PO CAPS
24.0000 ug | ORAL_CAPSULE | Freq: Two times a day (BID) | ORAL | 3 refills | Status: DC
Start: 2020-05-10 — End: 2020-08-03
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
## 2020-05-10 NOTE — Patient Instructions (Addendum)
Regular bowel movements requires a high fiber diet, drinking at least 64 ounces of water daily, and regular exercise. Foods that often help with constipation including bran, applesauce, and kiwi fruits.   I recommend a bowel purge to clean out your bowels. Please do the following: Purchase a bottle of Miralax over the counter as well as a box of 5 mg dulcolax tablets. Take 4 dulcolax tablets. Wait 1 hour. You will then drink 6-8 capfuls of Miralax mixed in an adequate amount of water/juice/gatorade (you may choose which of these liquids to drink) over the next 2-3 hours. You should expect results within 1 to 6 hours after completing the bowel purge.  After the bowel purge, please start taking Amitiza 24 mcg twice daily.   Please call and schedule a colonoscopy at your convenience. You may really want to get this out of the way once your constipation is improving!

## 2020-05-16 ENCOUNTER — Telehealth: Payer: Self-pay

## 2020-05-16 NOTE — Telephone Encounter (Signed)
Recd prior authorization request from [facility] for Amitiza. PA was initiated on covermymeds and approved- eff:05/16/20-05/15/21. Request has been faxed back to [facility] [address] with approval attached.

## 2020-05-17 ENCOUNTER — Other Ambulatory Visit (INDEPENDENT_AMBULATORY_CARE_PROVIDER_SITE_OTHER): Payer: Self-pay | Admitting: Family Medicine

## 2020-05-17 DIAGNOSIS — R609 Edema, unspecified: Secondary | ICD-10-CM

## 2020-05-20 ENCOUNTER — Encounter: Payer: Self-pay | Admitting: Family Medicine

## 2020-05-20 ENCOUNTER — Other Ambulatory Visit: Payer: Self-pay

## 2020-05-20 ENCOUNTER — Ambulatory Visit (INDEPENDENT_AMBULATORY_CARE_PROVIDER_SITE_OTHER): Payer: BC Managed Care – PPO | Admitting: Family Medicine

## 2020-05-20 VITALS — Ht 62.0 in

## 2020-05-20 DIAGNOSIS — M5431 Sciatica, right side: Secondary | ICD-10-CM | POA: Diagnosis not present

## 2020-05-20 NOTE — Patient Instructions (Addendum)
Plan for epidural steroid injectio at [facility].  Call [phone] to schedule.

## 2020-05-20 NOTE — Progress Notes (Signed)
Virtual Visit  via phone Note   I connected with Mikayla [name]  today by a telemedicine application and verified that I am speaking with the correct person using two identifiers. Location patient: home Location provider: work Persons Participating in the virtual visit: patient, provider   I discussed the limitations, risks, security and privacy concerns of performing an evaluation and management service by telephone and the availability of in person appointments. I also discussed with the patient that there may be a patient responsible charge related to this service. The patient expressed understanding and agreed to proceed.    I discussed the limitations of evaluation and management by telemedicine and the availability of in person appointments. The patient expressed understanding and agreed to proceed.  History of Present Illness: Mikayla [name] is a 54 y.o. female who would like to discuss her low back pain and B LE pain, R>L.  She was last seen by Dr. Denyse Amass on 04/20/20 and was prescribed Lyrica.  She has had prior lumbar epidurals while under the care of Dr. Farris Has at [facility].  Since her last visit, pt reports that she had been feeling pretty good but today is not a good day.  She has not been taking the Lyrica regularly.  She is having the majority of her pain in her R calf today.  She denies any numbness/tingling in her B LEs.  Diagnostic testing: L-spine MRI- 01/03/19   Observations/Objective: Normal speech and thought process.  Lab and Radiology Results  EXAM: MRI LUMBAR SPINE WITHOUT CONTRAST  TECHNIQUE: Multiplanar, multisequence MR imaging of the lumbar spine was performed. No intravenous contrast was administered.  COMPARISON:  None available.  FINDINGS: Segmentation: Standard. Lowest well-formed disc labeled the L5-S1 level.  Alignment: 3 mm anterolisthesis of L4 on L5 and L5 on S1. Alignment otherwise normal with preservation of the normal lumbar  lordosis.  Vertebrae: Vertebral body heights maintained without evidence for acute or chronic fracture. Bone marrow signal intensity within normal limits. Prominent hemangioma noted within the T10 vertebral body. No other discrete or worrisome osseous lesions. No abnormal marrow edema.  Conus medullaris and cauda equina: Conus extends to the L1 level. Conus and cauda equina appear normal.  Paraspinal and other soft tissues: Paraspinous soft tissues within normal limits. Visualized visceral structures unremarkable.  Disc levels:  T11-12: Mild annular disc bulge.  No stenosis.  T12-L1: Unremarkable.  L1-2:  Unremarkable.  L2-3: Negative interspace. Mild bilateral facet hypertrophy. No stenosis.  L3-4: Mild diffuse disc bulge with disc desiccation and intervertebral disc space narrowing. Mild bilateral facet hypertrophy. No significant canal or foraminal stenosis.  L4-5: 3 mm anterolisthesis. Associated mild diffuse disc bulge. Advanced bilateral facet arthrosis. No significant canal or lateral recess stenosis. Foramina remain patent.  L5-S1: 3 mm anterolisthesis. Associated shallow posterior disc bulge. There is a superimposed tiny right central/subarticular disc protrusion (series 6, image 8). Protruding disc appears to contact the descending right S1 nerve root which is minimally displaced posteriorly (series 11, image 37). Superimposed advanced bilateral facet arthrosis. Mild epidural lipomatosis. No significant canal or lateral recess stenosis. Foramina remain patent.  IMPRESSION: 1. Tiny right central/subarticular disc protrusion at L5-S1, contacting and potentially affecting the descending right S1 nerve root. 2. 3 mm anterolisthesis of L4 on L5 and L5 on S1 with associated advanced bilateral facet arthropathy. No significant stenosis at these levels. 3. Mild noncompressive disc bulging at L3-4 without significant stenosis or neural  impingement.   Electronically Signed   By:  Rise Mu M.D.   On: 01/05/2019 07:06 I, [name], personally (independently) visualized and performed the interpretation of the images attached in this note.   Assessment and Plan: 54 y.o. female with right leg pain consistent with S1 and L4 radiculopathy.  Patient had inadequate response to transforaminal epidural steroid injection at S1 in November 2020 at [facility]. Reordered epidural steroid injection today to be done at [facility] imaging.  Would recommend interlaminar approach possibly or potentially at a different level.  If not improved after this injection will repeat MRI.  Discussed with patient expresses understanding and agreement.   Follow Up Instructions:    I discussed the assessment and treatment plan with the patient. The patient was provided an opportunity to ask questions and all were answered. The patient agreed with the plan and demonstrated an understanding of the instructions.   The patient was advised to call back or seek an in-person evaluation if the symptoms worsen or if the condition fails to improve as anticipated.  Time: 15 minutes

## 2020-07-28 DIAGNOSIS — Z20828 Contact with and (suspected) exposure to other viral communicable diseases: Secondary | ICD-10-CM | POA: Diagnosis not present

## 2020-07-28 DIAGNOSIS — J101 Influenza due to other identified influenza virus with other respiratory manifestations: Secondary | ICD-10-CM | POA: Diagnosis not present

## 2020-07-28 DIAGNOSIS — U071 COVID-19: Secondary | ICD-10-CM | POA: Diagnosis not present

## 2020-07-28 DIAGNOSIS — B974 Respiratory syncytial virus as the cause of diseases classified elsewhere: Secondary | ICD-10-CM | POA: Diagnosis not present

## 2020-07-29 ENCOUNTER — Telehealth: Payer: Self-pay | Admitting: Physician Assistant

## 2020-07-29 NOTE — Telephone Encounter (Signed)
Patient called stating that she is covid positive - she would like to know about  treatment options and the booster shots - Patient states that she has a little chest congestion but not bad, Please advise.

## 2020-07-30 NOTE — Telephone Encounter (Signed)
Thank you very much 

## 2020-07-30 NOTE — Telephone Encounter (Signed)
Patient  On visit with husband on sat clinic  And she reported not feeling well and some lower chest pain  And fatigue But not too sob but malaise ( children had covid resolving  Husband tested negative)   She is immunized but concerned and interested in [facility] if possible  Her risk factors are  Obesity and  Pre diabetes   . I told her I would do a referral for assessment.

## 2020-07-31 ENCOUNTER — Ambulatory Visit (HOSPITAL_COMMUNITY)
Admission: RE | Admit: 2020-07-31 | Discharge: 2020-07-31 | Disposition: A | Discharge status: Home / Self Care | Payer: BC Managed Care – PPO | Source: Ambulatory Visit | Attending: Pulmonary Disease | Admitting: Pulmonary Disease

## 2020-07-31 VITALS — BP 107/64 | HR 72 | Temp 98.8°F | Resp 20

## 2020-07-31 DIAGNOSIS — U071 COVID-19: Secondary | ICD-10-CM | POA: Diagnosis not present

## 2020-07-31 MED ORDER — ALBUTEROL SULFATE HFA 108 (90 BASE) MCG/ACT IN AERS: 2.0000 | INHALATION_SPRAY | Freq: Once | RESPIRATORY_TRACT | Status: DC | PRN

## 2020-07-31 MED ORDER — METHYLPREDNISOLONE SODIUM SUCC 125 MG IJ SOLR: 125.0000 mg | Freq: Once | INTRAMUSCULAR | Status: DC | PRN

## 2020-07-31 MED ORDER — FAMOTIDINE IN NACL 20-0.9 MG/50ML-% IV SOLN: 20.0000 mg | Freq: Once | INTRAVENOUS | Status: DC | PRN

## 2020-07-31 MED ORDER — EPINEPHRINE 0.3 MG/0.3ML IJ SOAJ: 0.3000 mg | Freq: Once | INTRAMUSCULAR | Status: DC | PRN

## 2020-07-31 MED ORDER — SODIUM CHLORIDE 0.9 % IV SOLN: INTRAVENOUS | Status: DC | PRN

## 2020-07-31 MED ORDER — SODIUM CHLORIDE 0.9 % IV SOLN
1200.0000 mg | Freq: Once | INTRAVENOUS | Status: AC
  Administered 2020-07-31: 1200 mg via INTRAVENOUS
  Filled 2020-07-31: qty 10

## 2020-07-31 MED ORDER — DIPHENHYDRAMINE HCL 50 MG/ML IJ SOLN: 50.0000 mg | Freq: Once | INTRAMUSCULAR | Status: DC | PRN

## 2020-07-31 NOTE — Progress Notes (Signed)
  Diagnosis: COVID-19  Physician: Dr. P. Wright  Procedure: Covid Infusion Clinic Med: casirivimab\imdevimab infusion - Provided patient with casirivimab\imdevimab fact sheet for patients, parents and caregivers prior to infusion.  Complications: No immediate complications noted.  Discharge: Discharged home   Mikayla [name] 07/31/2020   

## 2020-07-31 NOTE — Progress Notes (Signed)
I connected by phone with Mikayla [name] on 07/31/2020 at 5:11 PM to discuss the potential use of a new treatment for mild to moderate COVID-19 viral infection in non-hospitalized patients.  This patient is a 54 y.o. female that meets the FDA criteria for Emergency Use Authorization of COVID monoclonal antibody casirivimab/imdevimab.  Has a (+) direct SARS-CoV-2 viral test result  Has mild or moderate COVID-19   Is NOT hospitalized due to COVID-19  Is within 10 days of symptom onset  Has at least one of the high risk factor(s) for progression to severe COVID-19 and/or hospitalization as defined in EUA.  Specific high risk criteria : BMI > 25   I have spoken and communicated the following to the patient or parent/caregiver regarding COVID monoclonal antibody treatment:  1. FDA has authorized the emergency use for the treatment of mild to moderate COVID-19 in adults and pediatric patients with positive results of direct SARS-CoV-2 viral testing who are 69 years of age and older weighing at least 40 kg, and who are at high risk for progressing to severe COVID-19 and/or hospitalization.  2. The significant known and potential risks and benefits of COVID monoclonal antibody, and the extent to which such potential risks and benefits are unknown.  3. Information on available alternative treatments and the risks and benefits of those alternatives, including clinical trials.  4. Patients treated with COVID monoclonal antibody should continue to self-isolate and use infection control measures (e.g., wear mask, isolate, social distance, avoid sharing personal items, clean and disinfect "high touch" surfaces, and frequent handwashing) according to CDC guidelines.   5. The patient or parent/caregiver has the option to accept or refuse COVID monoclonal antibody treatment.  After reviewing this information with the patient, The patient agreed to proceed with receiving casirivimab\imdevimab infusion  and will be provided a copy of the Fact sheet prior to receiving the infusion. Noreene Filbert 07/31/2020 5:11 PM

## 2020-07-31 NOTE — Discharge Instructions (Signed)

## 2020-08-01 NOTE — Telephone Encounter (Signed)
Patient has called back. States she had her first infusion yesterday.  States she is feeling better than she did the day before. Patient called in looking for guidance on what she should do.  Patient is quarantining currently. States both of her children had COVID along with her.  She is requesting a call back with any guidelines that may be helpful for her while she has COVID and if she should go back to get tested again for COVID.

## 2020-08-01 NOTE — Telephone Encounter (Signed)
I never had an official visit with her . ( just her husband)  She can make virtual  With me or Mr Mikayla [name]  To discuss

## 2020-08-02 NOTE — Telephone Encounter (Signed)
Patient advised and scheduled for 08/03/20 @ 3pm video visit

## 2020-08-02 NOTE — Telephone Encounter (Signed)
She has not been seen so needs appointment (video) to discuss in detail so we can give her further guidance.

## 2020-08-03 ENCOUNTER — Telehealth (INDEPENDENT_AMBULATORY_CARE_PROVIDER_SITE_OTHER): Payer: BC Managed Care – PPO | Admitting: Physician Assistant

## 2020-08-03 ENCOUNTER — Other Ambulatory Visit: Payer: Self-pay

## 2020-08-03 ENCOUNTER — Encounter: Payer: Self-pay | Admitting: Physician Assistant

## 2020-08-03 DIAGNOSIS — U071 COVID-19: Secondary | ICD-10-CM

## 2020-08-03 DIAGNOSIS — S46811A Strain of other muscles, fascia and tendons at shoulder and upper arm level, right arm, initial encounter: Secondary | ICD-10-CM

## 2020-08-03 MED ORDER — CYCLOBENZAPRINE HCL 10 MG PO TABS: 10.0000 mg | ORAL_TABLET | Freq: Every day | ORAL | 0 refills | Status: DC

## 2020-08-03 NOTE — Progress Notes (Signed)
I have discussed the procedure for the virtual visit with the patient who has given consent to proceed with assessment and treatment.   Patina S Moore, CMA     

## 2020-08-03 NOTE — Progress Notes (Signed)
   Virtual Visit via Video   I connected with patient on 08/03/20 at  3:00 PM EDT by a video enabled telemedicine application and verified that I am speaking with the correct person using two identifiers.  Location patient: Home Location provider: [facility], Office Persons participating in the virtual visit: Patient, Provider, CMA (Patina Moore)  I discussed the limitations of evaluation and management by telemedicine and the availability of in person appointments. The patient expressed understanding and agreed to proceed.  Subjective:   HPI:   Patient presents via Caregility today to discuss ongoing management for COVID infection.   Son first to start with symptoms 07/20/2020. By that Thursday, 9/2, she started with symptoms. Was tested and found to be positive. Was set up for monoclonal antibody infusion. Notes having as scheduled with good improvement in symptoms afterward. Currently, patient denies any SOB at rest. Notes some SOB with exertion along with fatigue. Denies fevers x 48 hours. Still having some nasal congestion and loss of taste or smell. Does note R-sided trapezius strain and discomfort for which she has been applying a heating pad.    ROS:   See pertinent positives and negatives per HPI.  Patient Active Problem List   Diagnosis Date Noted  . Degenerative disc disease, lumbar 05/11/2019  . Insulin resistance 02/19/2017  . Hyperlipidemia   . Constipation 04/12/2015  . 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 Vitamin D deficiency 08/13/2012  . Depression 11/27/2011  . Morbid obesity (HCC) 04/11/2010  . GERD 02/10/2009    Social History   Tobacco Use  . Smoking status: Never Smoker  . Smokeless tobacco: Never Used  Substance Use Topics  . Alcohol use: No   No current outpatient medications on file.  Allergies  Allergen Reactions  . Codeine Itching    Objective:   There were no vitals taken for this  visit.  Patient is well-developed, well-nourished in no acute distress.  Resting comfortably at home.  Head is normocephalic, atraumatic.  No labored breathing.  Speech is clear and coherent with logical content.  Patient is alert and oriented at baseline.   Assessment and Plan:   1. COVID-19 S/p antibody infusion. Improving daily. Some residual SOBOE. Reviewed supportive measures, OTC medications and Vitamin regimen. Continue hydration and rest. Symptoms should continue to resolve. She is to notify us immediately of any recurring or new symptoms. Quarantine reviewed with patient.   2. Strain of right trapezius muscle, initial encounter Rx Flexeril to use at night. Continue heating pad. Stretches reviewed. Follow-up if not continuing to improve/resolve.    Piedad Climes, PA-C 08/03/2020

## 2020-08-04 NOTE — Telephone Encounter (Signed)
Tammy, please see pt's mychart message and advise. 

## 2020-08-05 NOTE — Telephone Encounter (Signed)
Patient is feeling better, seen by PCP 2 days ago .  Please contact office for sooner follow up if symptoms do not improve or worsen or seek emergency care

## 2020-08-08 ENCOUNTER — Encounter: Payer: Self-pay | Admitting: Physician Assistant

## 2020-08-12 ENCOUNTER — Telehealth (INDEPENDENT_AMBULATORY_CARE_PROVIDER_SITE_OTHER): Payer: BC Managed Care – PPO | Admitting: Family Medicine

## 2020-08-12 ENCOUNTER — Encounter: Payer: Self-pay | Admitting: Family Medicine

## 2020-08-12 ENCOUNTER — Other Ambulatory Visit: Payer: Self-pay

## 2020-08-12 VITALS — Ht 62.0 in | Wt 266.0 lb

## 2020-08-12 DIAGNOSIS — H9202 Otalgia, left ear: Secondary | ICD-10-CM

## 2020-08-12 MED ORDER — AMOXICILLIN 875 MG PO TABS: 875.0000 mg | ORAL_TABLET | Freq: Two times a day (BID) | ORAL | 0 refills | Status: DC
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
## 2020-08-18 DIAGNOSIS — H5203 Hypermetropia, bilateral: Secondary | ICD-10-CM | POA: Diagnosis not present

## 2020-08-18 DIAGNOSIS — H52202 Unspecified astigmatism, left eye: Secondary | ICD-10-CM | POA: Diagnosis not present

## 2020-08-18 DIAGNOSIS — H1820 Unspecified corneal edema: Secondary | ICD-10-CM | POA: Diagnosis not present

## 2020-09-14 ENCOUNTER — Other Ambulatory Visit: Payer: Self-pay

## 2020-09-14 ENCOUNTER — Encounter: Payer: Self-pay | Admitting: Physician Assistant

## 2020-09-14 ENCOUNTER — Ambulatory Visit (INDEPENDENT_AMBULATORY_CARE_PROVIDER_SITE_OTHER): Payer: BC Managed Care – PPO | Admitting: Physician Assistant

## 2020-09-14 VITALS — BP 110/80 | HR 73 | Temp 98.3°F | Resp 16 | Ht 62.0 in | Wt 276.0 lb

## 2020-09-14 DIAGNOSIS — F5101 Primary insomnia: Secondary | ICD-10-CM

## 2020-09-14 DIAGNOSIS — Z6841 Body Mass Index (BMI) 40.0 and over, adult: Secondary | ICD-10-CM

## 2020-09-14 DIAGNOSIS — Z23 Encounter for immunization: Secondary | ICD-10-CM

## 2020-09-14 DIAGNOSIS — Z0000 Encounter for general adult medical examination without abnormal findings: Secondary | ICD-10-CM

## 2020-09-14 DIAGNOSIS — Z0001 Encounter for general adult medical examination with abnormal findings: Secondary | ICD-10-CM

## 2020-09-14 LAB — COMPREHENSIVE METABOLIC PANEL
ALT: 14 U/L (ref 0–35)
AST: 13 U/L (ref 0–37)
Albumin: 4.1 g/dL (ref 3.5–5.2)
Alkaline Phosphatase: 78 U/L (ref 39–117)
BUN: 14 mg/dL (ref 6–23)
CO2: 30 mEq/L (ref 19–32)
Calcium: 9.5 mg/dL (ref 8.4–10.5)
Chloride: 101 mEq/L (ref 96–112)
Creatinine, Ser: 0.52 mg/dL (ref 0.40–1.20)
GFR: 103.96 mL/min (ref >60.00)
Glucose, Bld: 79 mg/dL (ref 70–99)
Potassium: 4.4 mEq/L (ref 3.5–5.1)
Sodium: 139 mEq/L (ref 135–145)
Total Bilirubin: 0.4 mg/dL (ref 0.2–1.2)
Total Protein: 6.6 g/dL (ref 6.0–8.3)

## 2020-09-14 LAB — CBC WITH DIFFERENTIAL/PLATELET
Basophils Absolute: 0 10*3/uL (ref 0.0–0.1)
Basophils Relative: 0.4 % (ref 0.0–3.0)
Eosinophils Absolute: 0.2 10*3/uL (ref 0.0–0.7)
Eosinophils Relative: 3.1 % (ref 0.0–5.0)
HCT: 39 % (ref 36.0–46.0)
Hemoglobin: 13.1 g/dL (ref 12.0–15.0)
Lymphocytes Relative: 37.7 % (ref 12.0–46.0)
Lymphs Abs: 2.1 10*3/uL (ref 0.7–4.0)
MCHC: 33.5 g/dL (ref 30.0–36.0)
MCV: 89.9 fl (ref 78.0–100.0)
Monocytes Absolute: 0.4 10*3/uL (ref 0.1–1.0)
Monocytes Relative: 6.9 % (ref 3.0–12.0)
Neutro Abs: 2.9 10*3/uL (ref 1.4–7.7)
Neutrophils Relative %: 51.9 % (ref 43.0–77.0)
Platelets: 205 10*3/uL (ref 150.0–400.0)
RBC: 4.34 Mil/uL (ref 3.87–5.11)
RDW: 13.4 % (ref 11.5–15.5)
WBC: 5.6 10*3/uL (ref 4.0–10.5)

## 2020-09-14 LAB — HEMOGLOBIN A1C: Hgb A1c MFr Bld: 6.2 % (ref 4.6–6.5)

## 2020-09-14 LAB — LIPID PANEL
Cholesterol: 240 mg/dL — ABNORMAL HIGH (ref 0–200)
HDL: 51.2 mg/dL (ref >39.00)
LDL Cholesterol: 161 mg/dL — ABNORMAL HIGH (ref 0–99)
NonHDL: 189.07
Total CHOL/HDL Ratio: 5
Triglycerides: 139 mg/dL (ref 0.0–149.0)
VLDL: 27.8 mg/dL (ref 0.0–40.0)

## 2020-09-14 LAB — VITAMIN D 25 HYDROXY (VIT D DEFICIENCY, FRACTURES): VITD: 13.96 ng/mL — ABNORMAL LOW (ref 30.00–100.00)

## 2020-09-14 LAB — TSH: TSH: 2.09 u[IU]/mL (ref 0.35–4.50)

## 2020-09-14 LAB — SEDIMENTATION RATE: Sed Rate: 49 mm/hr — ABNORMAL HIGH (ref 0–30)

## 2020-09-14 MED ORDER — TRAZODONE HCL 50 MG PO TABS: 25.0000 mg | ORAL_TABLET | Freq: Every evening | ORAL | 3 refills | Status: DC | PRN
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
## 2020-09-14 NOTE — Patient Instructions (Signed)
Please go to the lab for blood work.   Our office will call you with your results unless you have chosen to receive results via MyChart.  If your blood work is normal we will follow-up each year for physicals and as scheduled for chronic medical problems.  If anything is abnormal we will treat accordingly and get you in for a follow-up.  I am working on getting Wegovy approved to help with weight loss.  I will let you know if I get approval.    Preventive Care 84-56 Years Old, Female Preventive care refers to visits with your health care provider and lifestyle choices that can promote health and wellness. This includes:  A yearly physical exam. This may also be called an annual well check.  Regular dental visits and eye exams.  Immunizations.  Screening for certain conditions.  Healthy lifestyle choices, such as eating a healthy diet, getting regular exercise, not using drugs or products that contain nicotine and tobacco, and limiting alcohol use. What can I expect for my preventive care visit? Physical exam Your health care provider will check your:  Height and weight. This may be used to calculate body mass index (BMI), which tells if you are at a healthy weight.  Heart rate and blood pressure.  Skin for abnormal spots. Counseling Your health care provider may ask you questions about your:  Alcohol, tobacco, and drug use.  Emotional well-being.  Home and relationship well-being.  Sexual activity.  Eating habits.  Work and work Statistician.  Method of birth control.  Menstrual cycle.  Pregnancy history. What immunizations do I need?  Influenza (flu) vaccine  This is recommended every year. Tetanus, diphtheria, and pertussis (Tdap) vaccine  You may need a Td booster every 10 years. Varicella (chickenpox) vaccine  You may need this if you have not been vaccinated. Zoster (shingles) vaccine  You may need this after age 30. Measles, mumps, and rubella  (MMR) vaccine  You may need at least one dose of MMR if you were born in 1957 or later. You may also need a second dose. Pneumococcal conjugate (PCV13) vaccine  You may need this if you have certain conditions and were not previously vaccinated. Pneumococcal polysaccharide (PPSV23) vaccine  You may need one or two doses if you smoke cigarettes or if you have certain conditions. Meningococcal conjugate (MenACWY) vaccine  You may need this if you have certain conditions. Hepatitis A vaccine  You may need this if you have certain conditions or if you travel or work in places where you may be exposed to hepatitis A. Hepatitis B vaccine  You may need this if you have certain conditions or if you travel or work in places where you may be exposed to hepatitis B. Haemophilus influenzae type b (Hib) vaccine  You may need this if you have certain conditions. Human papillomavirus (HPV) vaccine  If recommended by your health care provider, you may need three doses over 6 months. You may receive vaccines as individual doses or as more than one vaccine together in one shot (combination vaccines). Talk with your health care provider about the risks and benefits of combination vaccines. What tests do I need? Blood tests  Lipid and cholesterol levels. These may be checked every 5 years, or more frequently if you are over 6 years old.  Hepatitis C test.  Hepatitis B test. Screening  Lung cancer screening. You may have this screening every year starting at age 56 if you have a 30-pack-year history  of smoking and currently smoke or have quit within the past 15 years.  Colorectal cancer screening. All adults should have this screening starting at age 56 and continuing until age 56. Your health care provider may recommend screening at age 56 if you are at increased risk. You will have tests every 1-10 years, depending on your results and the type of screening test.  Diabetes screening. This is done  by checking your blood sugar (glucose) after you have not eaten for a while (fasting). You may have this done every 1-3 years.  Mammogram. This may be done every 1-2 years. Talk with your health care provider about when you should start having regular mammograms. This may depend on whether you have a family history of breast cancer.  BRCA-related cancer screening. This may be done if you have a family history of breast, ovarian, tubal, or peritoneal cancers.  Pelvic exam and Pap test. This may be done every 3 years starting at age 56. Starting at age 42, this may be done every 5 years if you have a Pap test in combination with an HPV test. Other tests  Sexually transmitted disease (STD) testing.  Bone density scan. This is done to screen for osteoporosis. You may have this scan if you are at high risk for osteoporosis. Follow these instructions at home: Eating and drinking  Eat a diet that includes fresh fruits and vegetables, whole grains, lean protein, and low-fat dairy.  Take vitamin and mineral supplements as recommended by your health care provider.  Do not drink alcohol if: ? Your health care provider tells you not to drink. ? You are pregnant, may be pregnant, or are planning to become pregnant.  If you drink alcohol: ? Limit how much you have to 0-1 drink a day. ? Be aware of how much alcohol is in your drink. In the [place] one drink equals one 12 oz bottle of beer (355 mL), one 5 oz glass of wine (148 mL), or one 1 oz glass of hard liquor (44 mL). Lifestyle  Take daily care of your teeth and gums.  Stay active. Exercise for at least 30 minutes on 5 or more days each week.  Do not use any products that contain nicotine or tobacco, such as cigarettes, e-cigarettes, and chewing tobacco. If you need help quitting, ask your health care provider.  If you are sexually active, practice safe sex. Use a condom or other form of birth control (contraception) in order to prevent  pregnancy and STIs (sexually transmitted infections).  If told by your health care provider, take low-dose aspirin daily starting at age 5. What's next?  Visit your health care provider once a year for a well check visit.  Ask your health care provider how often you should have your eyes and teeth checked.  Stay up to date on all vaccines. This information is not intended to replace advice given to you by your health care provider. Make sure you discuss any questions you have with your health care provider. Document Revised: 07/17/2018 Document Reviewed: 07/17/2018 Elsevier Patient Education  2020 [employer].

## 2020-09-16 ENCOUNTER — Other Ambulatory Visit: Payer: Self-pay | Admitting: Emergency Medicine

## 2020-09-16 ENCOUNTER — Encounter: Payer: Self-pay | Admitting: Physician Assistant

## 2020-09-16 DIAGNOSIS — E782 Mixed hyperlipidemia: Secondary | ICD-10-CM

## 2020-09-16 DIAGNOSIS — Z6841 Body Mass Index (BMI) 40.0 and over, adult: Secondary | ICD-10-CM

## 2020-09-16 DIAGNOSIS — R700 Elevated erythrocyte sedimentation rate: Secondary | ICD-10-CM

## 2020-09-16 DIAGNOSIS — E559 Vitamin D deficiency, unspecified: Secondary | ICD-10-CM

## 2020-09-16 MED ORDER — VITAMIN D3 50 MCG (2000 UT) PO TABS: 1.0000 | ORAL_TABLET | Freq: Every day | ORAL | 0 refills | Status: DC

## 2020-09-16 MED ORDER — PRAVASTATIN SODIUM 20 MG PO TABS: 20.0000 mg | ORAL_TABLET | Freq: Every day | ORAL | 1 refills | Status: DC

## 2020-09-16 MED ORDER — SAXENDA 18 MG/3ML ~~LOC~~ SOPN: PEN_INJECTOR | SUBCUTANEOUS | 3 refills | Status: DC

## 2020-09-16 MED ORDER — PEN NEEDLES 32G X 6 MM MISC: 0 refills | Status: DC

## 2020-09-16 MED ORDER — VITAMIN D (ERGOCALCIFEROL) 1.25 MG (50000 UNIT) PO CAPS: ORAL_CAPSULE | ORAL | 2 refills | Status: DC

## 2020-09-16 NOTE — Telephone Encounter (Signed)
Please also call in medication for weight loss

## 2020-09-20 ENCOUNTER — Other Ambulatory Visit: Payer: Self-pay

## 2020-09-20 ENCOUNTER — Telehealth: Payer: Self-pay | Admitting: Physician Assistant

## 2020-09-20 ENCOUNTER — Other Ambulatory Visit (INDEPENDENT_AMBULATORY_CARE_PROVIDER_SITE_OTHER): Payer: BC Managed Care – PPO

## 2020-09-20 DIAGNOSIS — R700 Elevated erythrocyte sedimentation rate: Secondary | ICD-10-CM | POA: Diagnosis not present

## 2020-09-20 NOTE — Progress Notes (Signed)
Pt here for labs. Blood sample obtained and process. Tolerated well.

## 2020-09-20 NOTE — Telephone Encounter (Signed)
Pt is here for lab work she is asking if she can leave a urine sample she is having some discomfort, please advise

## 2020-09-21 LAB — CYCLIC CITRUL PEPTIDE ANTIBODY, IGG: Cyclic Citrullin Peptide Ab: <16 UNITS

## 2020-09-22 ENCOUNTER — Telehealth: Payer: Self-pay | Admitting: Physician Assistant

## 2020-09-22 NOTE — Telephone Encounter (Signed)
Patient called concerning her medication to lower A1c and weight loss - patient states it is an injection - She wants to know if it has been approved yet.  Patient also would like to know if she needs to follow up on her bloodwork.

## 2020-09-23 ENCOUNTER — Other Ambulatory Visit: Payer: Self-pay | Admitting: Emergency Medicine

## 2020-09-23 DIAGNOSIS — R768 Other specified abnormal immunological findings in serum: Secondary | ICD-10-CM

## 2020-09-23 DIAGNOSIS — R700 Elevated erythrocyte sedimentation rate: Secondary | ICD-10-CM

## 2020-09-23 LAB — ANTI-NUCLEAR AB-TITER (ANA TITER): ANA Titer 1: 1:40 {titer} — ABNORMAL HIGH

## 2020-09-23 LAB — RHEUMATOID FACTOR: Rheumatoid fact SerPl-aCnc: <14 IU/mL (ref <14)

## 2020-09-23 LAB — ANA: Anti Nuclear Antibody (ANA): POSITIVE — AB

## 2020-09-23 NOTE — Telephone Encounter (Signed)
Spoke with patient and she states the Saxenda was sent to the pharmacy but was denied. I don't remember seeing a PA. Will start thru Cover my meds website.

## 2020-09-23 NOTE — Telephone Encounter (Signed)
PA started in Cover my med website. Waiting on response from insurance company

## 2020-09-26 NOTE — Telephone Encounter (Signed)
Approved. Patient can pick up from pharmacy.

## 2020-09-28 ENCOUNTER — Other Ambulatory Visit: Payer: Self-pay | Admitting: Physician Assistant

## 2020-09-28 DIAGNOSIS — Z1231 Encounter for screening mammogram for malignant neoplasm of breast: Secondary | ICD-10-CM

## 2020-10-03 ENCOUNTER — Encounter: Payer: Self-pay | Admitting: Physician Assistant

## 2020-10-03 ENCOUNTER — Ambulatory Visit (INDEPENDENT_AMBULATORY_CARE_PROVIDER_SITE_OTHER): Payer: BC Managed Care – PPO | Admitting: Physician Assistant

## 2020-10-03 ENCOUNTER — Other Ambulatory Visit: Payer: Self-pay

## 2020-10-03 VITALS — BP 100/70 | HR 94 | Temp 98.2°F | Resp 14 | Ht 62.0 in | Wt 274.0 lb

## 2020-10-03 DIAGNOSIS — R1013 Epigastric pain: Secondary | ICD-10-CM | POA: Diagnosis not present

## 2020-10-03 LAB — COMPREHENSIVE METABOLIC PANEL
ALT: 14 U/L (ref 0–35)
AST: 14 U/L (ref 0–37)
Albumin: 4 g/dL (ref 3.5–5.2)
Alkaline Phosphatase: 77 U/L (ref 39–117)
BUN: 16 mg/dL (ref 6–23)
CO2: 29 mEq/L (ref 19–32)
Calcium: 9.1 mg/dL (ref 8.4–10.5)
Chloride: 104 mEq/L (ref 96–112)
Creatinine, Ser: 0.6 mg/dL (ref 0.40–1.20)
GFR: 100.4 mL/min (ref >60.00)
Glucose, Bld: 91 mg/dL (ref 70–99)
Potassium: 4.3 mEq/L (ref 3.5–5.1)
Sodium: 141 mEq/L (ref 135–145)
Total Bilirubin: 0.5 mg/dL (ref 0.2–1.2)
Total Protein: 6.8 g/dL (ref 6.0–8.3)

## 2020-10-03 LAB — LIPASE: Lipase: 28 U/L (ref 11.0–59.0)

## 2020-10-03 NOTE — Patient Instructions (Signed)
Please go to the lab today for blood work.  I will call you with your results. We will alter treatment regimen(s) if indicated by your results.   Follow dietary recommendations below. Keep well-hydrated.  Stop the Saxenda for a couple of days while we are figuring things out.   Hang in there!
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
## 2020-10-13 ENCOUNTER — Other Ambulatory Visit: Payer: Self-pay | Admitting: Physician Assistant

## 2020-10-13 DIAGNOSIS — E782 Mixed hyperlipidemia: Secondary | ICD-10-CM
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
## 2020-10-19 ENCOUNTER — Encounter: Payer: Self-pay | Admitting: Internal Medicine

## 2020-10-19 ENCOUNTER — Ambulatory Visit: Payer: BC Managed Care – PPO | Admitting: Internal Medicine

## 2020-10-19 ENCOUNTER — Other Ambulatory Visit: Payer: Self-pay

## 2020-10-19 ENCOUNTER — Ambulatory Visit
Admission: RE | Admit: 2020-10-19 | Discharge: 2020-10-19 | Disposition: A | Discharge status: Home / Self Care | Payer: BC Managed Care – PPO | Source: Ambulatory Visit | Attending: Internal Medicine | Admitting: Internal Medicine

## 2020-10-19 VITALS — BP 110/70 | HR 86 | Ht 62.0 in | Wt 274.0 lb

## 2020-10-19 DIAGNOSIS — R0989 Other specified symptoms and signs involving the circulatory and respiratory systems: Secondary | ICD-10-CM

## 2020-10-19 DIAGNOSIS — E559 Vitamin D deficiency, unspecified: Secondary | ICD-10-CM | POA: Diagnosis not present

## 2020-10-19 DIAGNOSIS — R768 Other specified abnormal immunological findings in serum: Secondary | ICD-10-CM

## 2020-10-19 DIAGNOSIS — R7689 Other specified abnormal immunological findings in serum: Secondary | ICD-10-CM | POA: Insufficient documentation

## 2020-10-19 DIAGNOSIS — M5136 Other intervertebral disc degeneration, lumbar region: Secondary | ICD-10-CM | POA: Diagnosis not present

## 2020-10-19 NOTE — Patient Instructions (Signed)
Antinuclear Antibody Test Why am I having this test? This is a test that is used to help diagnose systemic lupus erythematosus (SLE) and other autoimmune diseases. An autoimmune disease is a disease in which the body's own defense (immune)system attacks its organs. What is being tested? This test checks for antinuclear antibodies (ANA) in the blood. The presence of ANA is associated with several autoimmune diseases. It is seen in almost all patients with lupus. What kind of sample is taken?  A blood sample is required for this test. It is usually collected by inserting a needle into a blood vessel. How are the results reported? Your test results will be reported as either positive or negative. A false-positive result can occur. A false positive is incorrect because it means that a condition is present when it is not. What do the results mean? A positive test result may mean that you have:  Lupus.  Other autoimmune diseases, such as rheumatoid arthritis, scleroderma, or Sjgren syndrome. Conditions that may cause a false-positive result include:  Liver dysfunction.  Myasthenia gravis.  Infectious mononucleosis. Talk with your health care provider about what your results mean. Questions to ask your health care provider Ask your health care provider, or the department that is doing the test:  When will my results be ready?  How will I get my results?  What are my treatment options?  What other tests do I need?  What are my next steps? Summary  This is a test that is used to help diagnose systemic lupus erythematosus (SLE) and other autoimmune diseases. An autoimmune disease is a disease in which the body's own defense (immune)system attacks the body.  This test checks for antinuclear antibodies (ANA) in the blood. The presence of ANA is associated with several autoimmune diseases. It is seen in almost all patients with lupus.  Your test results will be reported as either  positive or negative. Talk with your health care provider about what your results mean. This information is not intended to replace advice given to you by your health care provider. Make sure you discuss any questions you have with your health care provider. Document Revised: 10/18/2017 Document Reviewed: 07/04/2017 Elsevier Patient Education  2020 Elsevier Inc.  

## 2020-10-20 LAB — C3 AND C4
C3 Complement: 179 mg/dL (ref 83–193)
C4 Complement: 44 mg/dL (ref 15–57)

## 2020-10-20 LAB — SJOGRENS SYNDROME-A EXTRACTABLE NUCLEAR ANTIBODY: SSA (Ro) (ENA) Antibody, IgG: <1 AI

## 2020-10-20 LAB — ANTI-DNA ANTIBODY, DOUBLE-STRANDED: ds DNA Ab: <1 IU/mL

## 2020-10-20 LAB — ANTI-SMITH ANTIBODY: ENA SM Ab Ser-aCnc: <1 AI

## 2020-10-20 LAB — SJOGRENS SYNDROME-B EXTRACTABLE NUCLEAR ANTIBODY: SSB (La) (ENA) Antibody, IgG: <1 AI

## 2020-10-20 NOTE — Progress Notes (Signed)
Lab tests for autoimmune disease are negative including the SSA, SSB, dsDNA, and smith antibody tests and complements. Her chest xray looks normal with no evidence of infection or inflammation. Based on this and our negative exam in clinic no additional testing or treatment recommended.

## 2020-11-08 ENCOUNTER — Ambulatory Visit: Payer: BC Managed Care – PPO

## 2020-11-10 DIAGNOSIS — U071 COVID-19: Secondary | ICD-10-CM | POA: Diagnosis not present

## 2020-11-10 DIAGNOSIS — J101 Influenza due to other identified influenza virus with other respiratory manifestations: Secondary | ICD-10-CM | POA: Diagnosis not present

## 2020-11-10 DIAGNOSIS — Z20828 Contact with and (suspected) exposure to other viral communicable diseases: Secondary | ICD-10-CM | POA: Diagnosis not present

## 2020-11-10 DIAGNOSIS — B974 Respiratory syncytial virus as the cause of diseases classified elsewhere: Secondary | ICD-10-CM | POA: Diagnosis not present

## 2020-11-12 ENCOUNTER — Other Ambulatory Visit: Payer: Self-pay | Admitting: Physician Assistant

## 2020-11-15 ENCOUNTER — Encounter: Payer: Self-pay | Admitting: Physician Assistant

## 2020-12-16 ENCOUNTER — Ambulatory Visit: Payer: BC Managed Care – PPO

## 2021-01-30 ENCOUNTER — Encounter: Payer: Self-pay | Admitting: Family Medicine

## 2021-01-30 ENCOUNTER — Ambulatory Visit: Payer: BC Managed Care – PPO | Admitting: Family Medicine

## 2021-01-30 ENCOUNTER — Other Ambulatory Visit: Payer: Self-pay

## 2021-01-30 VITALS — BP 110/70 | HR 82 | Temp 97.8°F | Resp 18 | Ht 62.0 in | Wt 272.6 lb

## 2021-01-30 DIAGNOSIS — R350 Frequency of micturition: Secondary | ICD-10-CM | POA: Diagnosis not present

## 2021-01-30 LAB — POCT URINALYSIS DIPSTICK
Blood, UA: 10
Glucose, UA: NEGATIVE
Ketones, UA: NEGATIVE
Nitrite, UA: NEGATIVE
Protein, UA: POSITIVE — AB
Spec Grav, UA: >=1.03 — AB (ref 1.010–1.025)
Urobilinogen, UA: 0.2 E.U./dL
pH, UA: 5 (ref 5.0–8.0)

## 2021-01-30 MED ORDER — PHENAZOPYRIDINE HCL 200 MG PO TABS: 200.0000 mg | ORAL_TABLET | Freq: Three times a day (TID) | ORAL | 0 refills | Status: DC | PRN

## 2021-01-30 MED ORDER — CEPHALEXIN 500 MG PO CAPS: 500.0000 mg | ORAL_CAPSULE | Freq: Two times a day (BID) | ORAL | 0 refills | Status: AC

## 2021-01-30 NOTE — Progress Notes (Signed)
   Subjective:    Patient ID: Mikayla [name], female    DOB: [birth year], 55 y.o.   MRN: [id number]  HPI UTI- pt reports sxs started on Friday.  + odor, frequency, urgency, hesitancy.  + dysuria- 'burning'.  Pt has hx of UTIs and reports this feels similar.  No fevers.  No abd pain.  No N/V.   Review of Systems For ROS see HPI   This visit occurred during the SARS-CoV-2 public health emergency.  Safety protocols were in place, including screening questions prior to the visit, additional usage of staff PPE, and extensive cleaning of exam room while observing appropriate contact time as indicated for disinfecting solutions.       Objective:   Physical Exam Vitals reviewed.  Constitutional:      General: She is not in acute distress.    Appearance: Normal appearance. She is well-developed. She is obese.  HENT:     Head: Normocephalic and atraumatic.  Eyes:     Extraocular Movements: Extraocular movements intact.     Conjunctiva/sclera: Conjunctivae normal.     Pupils: Pupils are equal, round, and reactive to light.  Abdominal:     General: There is no distension.     Palpations: Abdomen is soft.     Tenderness: There is no abdominal tenderness (no suprapubic or CVA tenderness).  Skin:    General: Skin is warm and dry.  Neurological:     General: No focal deficit present.     Mental Status: She is oriented to person, place, and time.  Psychiatric:        Mood and Affect: Mood normal.        Behavior: Behavior normal.        Thought Content: Thought content normal.           Assessment & Plan:  Urinary frequency- new.  Pt has multiple UTI sxs and feels this is consistent w/ previous infections.  Will start Keflex and send urine for culture.  Pt expressed understanding and is in agreement w/ plan.

## 2021-01-30 NOTE — Patient Instructions (Signed)
Follow up as needed START the Cephalexin (Keflex) twice daily- w/ food Use the pyridium as needed for pain Drink LOTS of water Call with any question or concerns Hang in there!!!

## 2021-01-30 NOTE — Addendum Note (Signed)
Addended by: Karen Kays C on: 01/30/2021 01:26 PM   Modules accepted: Orders

## 2021-02-01 LAB — URINE CULTURE
MICRO NUMBER:: 11644095
SPECIMEN QUALITY:: ADEQUATE

## 2021-02-06 ENCOUNTER — Telehealth: Payer: Self-pay | Admitting: Physician Assistant

## 2021-02-06 ENCOUNTER — Other Ambulatory Visit: Payer: Self-pay

## 2021-02-06 MED ORDER — CEPHALEXIN 500 MG PO CAPS: 500.0000 mg | ORAL_CAPSULE | Freq: Two times a day (BID) | ORAL | 0 refills | Status: DC

## 2021-02-06 NOTE — Telephone Encounter (Signed)
Patient called stating that she got a little better while on the antibiotic but feels like the UTI is coming back.  Please advise.

## 2021-02-06 NOTE — Telephone Encounter (Signed)
Finished 5 days of keflex. Still experiencing burning during urination, incontinence, and feeling hot.

## 2021-02-06 NOTE — Telephone Encounter (Signed)
New script sent to pharmacy

## 2021-02-06 NOTE — Telephone Encounter (Signed)
Ok to repeat Keflex 500mg  BID x5 days, #10, no refills.  If no improvement, needs repeat visit

## 2021-02-17 ENCOUNTER — Ambulatory Visit: Payer: BC Managed Care – PPO | Admitting: Family Medicine

## 2021-02-17 ENCOUNTER — Other Ambulatory Visit: Payer: Self-pay

## 2021-02-17 ENCOUNTER — Encounter: Payer: Self-pay | Admitting: Family Medicine

## 2021-02-17 VITALS — BP 110/70 | HR 76 | Temp 97.7°F | Resp 17 | Ht 62.0 in | Wt 276.0 lb

## 2021-02-17 DIAGNOSIS — R300 Dysuria: Secondary | ICD-10-CM | POA: Diagnosis not present

## 2021-02-17 DIAGNOSIS — R350 Frequency of micturition: Secondary | ICD-10-CM

## 2021-02-17 LAB — POCT URINALYSIS DIPSTICK
Bilirubin, UA: NEGATIVE
Glucose, UA: NEGATIVE
Ketones, UA: NEGATIVE
Nitrite, UA: POSITIVE
Protein, UA: POSITIVE — AB
Spec Grav, UA: 1.025 (ref 1.010–1.025)
Urobilinogen, UA: 0.2 E.U./dL
pH, UA: 6 (ref 5.0–8.0)

## 2021-02-17 MED ORDER — SULFAMETHOXAZOLE-TRIMETHOPRIM 800-160 MG PO TABS: 1.0000 | ORAL_TABLET | Freq: Two times a day (BID) | ORAL | 0 refills | Status: DC

## 2021-02-17 NOTE — Patient Instructions (Addendum)
Schedule a cholesterol follow up for mid-May w/ Dr Beverely Low Start the Bactrim twice daily Drink plenty of fluids Call with any questions or concerns Hang in there!!

## 2021-02-17 NOTE — Progress Notes (Signed)
   Subjective:    Patient ID: Blanche Scovell, female    DOB: 1965/11/30, 55 y.o.   MRN: [id number]  HPI Dysuria- pt was seen 2 weeks ago for similar and tx'd w/ Keflex for E Coli UTI.  Sxs never improved despite 2 rounds of Keflex.  Is having to urinate 3-4x/night.  + urgency.  States that urine is dark, smelly, burns.  No visible blood.  Pt had urologist but had not been seen since 2017 so they told her she was a new patient and needs to wait until May.   Review of Systems For ROS see HPI   This visit occurred during the SARS-CoV-2 public health emergency.  Safety protocols were in place, including screening questions prior to the visit, additional usage of staff PPE, and extensive cleaning of exam room while observing appropriate contact time as indicated for disinfecting solutions.       Objective:   Physical Exam Vitals reviewed.  Constitutional:      General: She is not in acute distress.    Appearance: Normal appearance. She is well-developed. She is obese. She is not ill-appearing.  Abdominal:     General: There is no distension.     Palpations: Abdomen is soft.     Tenderness: There is no abdominal tenderness (no suprapubic or CVA tenderness). There is no right CVA tenderness or left CVA tenderness.  Skin:    General: Skin is warm and dry.  Neurological:     Mental Status: She is alert.           Assessment & Plan:  Dysuria/Frequency- pt's sxs and UA are consistent w/ infxn.  Start Bactrim as pt just completed 2 rounds of Keflex.  She has Urology appt upcoming in May.  Encouraged her to keep that appt due to recurrent UTIs.  Pt expressed understanding and is in agreement w/ plan.

## 2021-02-19 LAB — URINE CULTURE
MICRO NUMBER:: 11722871
SPECIMEN QUALITY:: ADEQUATE

## 2021-02-23 ENCOUNTER — Telehealth: Payer: Self-pay | Admitting: Physician Assistant

## 2021-02-23 NOTE — Telephone Encounter (Signed)
Please call patient - she would like to come in tomorrow for another urine test - still feels like she may have some infection left. Please advise patient

## 2021-02-23 NOTE — Telephone Encounter (Signed)
Is it ok to have patient come in to leave another urine sample?

## 2021-02-24 ENCOUNTER — Other Ambulatory Visit (INDEPENDENT_AMBULATORY_CARE_PROVIDER_SITE_OTHER): Payer: BC Managed Care – PPO

## 2021-02-24 DIAGNOSIS — R350 Frequency of micturition: Secondary | ICD-10-CM

## 2021-02-24 NOTE — Telephone Encounter (Signed)
Ok to put pt on lab schedule for UA

## 2021-02-24 NOTE — Telephone Encounter (Signed)
Patient cannot get here today so she is on the schedule for Monday.

## 2021-02-27 ENCOUNTER — Other Ambulatory Visit: Payer: BC Managed Care – PPO

## 2021-02-27 ENCOUNTER — Other Ambulatory Visit: Payer: Self-pay

## 2021-02-27 DIAGNOSIS — R350 Frequency of micturition: Secondary | ICD-10-CM

## 2021-02-27 LAB — POCT URINALYSIS DIPSTICK
Bilirubin, UA: 1
Blood, UA: NEGATIVE
Glucose, UA: NEGATIVE
Ketones, UA: NEGATIVE
Leukocytes, UA: NEGATIVE
Nitrite, UA: NEGATIVE
Protein, UA: POSITIVE — AB
Spec Grav, UA: >=1.03 — AB (ref 1.010–1.025)
Urobilinogen, UA: 0.2 E.U./dL
pH, UA: 5.5 (ref 5.0–8.0)

## 2021-02-27 NOTE — Addendum Note (Signed)
Addended by: Karen Kays C on: 02/27/2021 12:43 PM   Modules accepted: Orders

## 2021-02-28 LAB — URINE CULTURE
MICRO NUMBER:: 11754795
SPECIMEN QUALITY:: ADEQUATE

## 2021-03-03 ENCOUNTER — Encounter (HOSPITAL_BASED_OUTPATIENT_CLINIC_OR_DEPARTMENT_OTHER): Payer: Self-pay | Admitting: Emergency Medicine

## 2021-03-03 ENCOUNTER — Emergency Department (HOSPITAL_BASED_OUTPATIENT_CLINIC_OR_DEPARTMENT_OTHER)
Admission: EM | Admit: 2021-03-03 | Discharge: 2021-03-03 | Disposition: A | Discharge status: Home / Self Care | Payer: BC Managed Care – PPO | Attending: Emergency Medicine | Admitting: Emergency Medicine

## 2021-03-03 ENCOUNTER — Other Ambulatory Visit: Payer: Self-pay

## 2021-03-03 DIAGNOSIS — M7989 Other specified soft tissue disorders: Secondary | ICD-10-CM | POA: Diagnosis not present

## 2021-03-03 DIAGNOSIS — R609 Edema, unspecified: Secondary | ICD-10-CM

## 2021-03-03 DIAGNOSIS — R300 Dysuria: Secondary | ICD-10-CM | POA: Diagnosis not present

## 2021-03-03 DIAGNOSIS — R600 Localized edema: Secondary | ICD-10-CM | POA: Diagnosis not present

## 2021-03-03 LAB — URINALYSIS, ROUTINE W REFLEX MICROSCOPIC
Bilirubin Urine: NEGATIVE
Glucose, UA: NEGATIVE mg/dL
Hgb urine dipstick: NEGATIVE
Ketones, ur: NEGATIVE mg/dL
Leukocytes,Ua: NEGATIVE
Nitrite: NEGATIVE
Protein, ur: NEGATIVE mg/dL
Specific Gravity, Urine: 1.018 (ref 1.005–1.030)
pH: 6 (ref 5.0–8.0)

## 2021-03-03 LAB — CBC WITH DIFFERENTIAL/PLATELET
Abs Immature Granulocytes: 0.02 10*3/uL (ref 0.00–0.07)
Basophils Absolute: 0 10*3/uL (ref 0.0–0.1)
Basophils Relative: 0 %
Eosinophils Absolute: 0.2 10*3/uL (ref 0.0–0.5)
Eosinophils Relative: 3 %
HCT: 38.2 % (ref 36.0–46.0)
Hemoglobin: 12 g/dL (ref 12.0–15.0)
Immature Granulocytes: 0 %
Lymphocytes Relative: 32 %
Lymphs Abs: 2 10*3/uL (ref 0.7–4.0)
MCH: 29.3 pg (ref 26.0–34.0)
MCHC: 31.4 g/dL (ref 30.0–36.0)
MCV: 93.4 fL (ref 80.0–100.0)
Monocytes Absolute: 0.6 10*3/uL (ref 0.1–1.0)
Monocytes Relative: 10 %
Neutro Abs: 3.3 10*3/uL (ref 1.7–7.7)
Neutrophils Relative %: 55 %
Platelets: 176 10*3/uL (ref 150–400)
RBC: 4.09 MIL/uL (ref 3.87–5.11)
RDW: 13 % (ref 11.5–15.5)
WBC: 6.2 10*3/uL (ref 4.0–10.5)
nRBC: 0 % (ref 0.0–0.2)

## 2021-03-03 LAB — COMPREHENSIVE METABOLIC PANEL
ALT: 13 U/L (ref 0–44)
AST: 11 U/L — ABNORMAL LOW (ref 15–41)
Albumin: 3.8 g/dL (ref 3.5–5.0)
Alkaline Phosphatase: 73 U/L (ref 38–126)
Anion gap: 7 (ref 5–15)
BUN: 18 mg/dL (ref 6–20)
CO2: 29 mmol/L (ref 22–32)
Calcium: 9.2 mg/dL (ref 8.9–10.3)
Chloride: 103 mmol/L (ref 98–111)
Creatinine, Ser: 0.57 mg/dL (ref 0.44–1.00)
GFR, Estimated: >60 mL/min (ref >60)
Glucose, Bld: 89 mg/dL (ref 70–99)
Potassium: 4.2 mmol/L (ref 3.5–5.1)
Sodium: 139 mmol/L (ref 135–145)
Total Bilirubin: 0.3 mg/dL (ref 0.3–1.2)
Total Protein: 6.8 g/dL (ref 6.5–8.1)

## 2021-03-03 MED ORDER — FUROSEMIDE 20 MG PO TABS: 20.0000 mg | ORAL_TABLET | Freq: Every day | ORAL | 0 refills | Status: DC
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
## 2021-03-03 NOTE — ED Triage Notes (Signed)
Pt c/o bilat feet and leg swelling for a few days. Denies SOB. Recently treated for UTI and states symptoms persist.

## 2021-03-03 NOTE — Discharge Instructions (Addendum)
Consider wearing compression stockings.  Decrease your salt intake.  Take the medication in the morning.  Follow up with your doctor to be rechecked.

## 2021-03-03 NOTE — ED Notes (Signed)
States she has hadswelling in her feet and legs  X several days states she called her PCP and they were closed today , states she feels like her whole body is tight, states takes no meds

## 2021-03-08 ENCOUNTER — Telehealth: Payer: Self-pay | Admitting: Family Medicine

## 2021-03-08 NOTE — Telephone Encounter (Signed)
Please call Mikayla [name] - She needs to talk to Dr. Beverely Low about fluid pills that Emergency room doctor gave her.  He only gave her 7 and she would like to keep taking them.  Please advise

## 2021-03-09 NOTE — Telephone Encounter (Signed)
Pt would need to schedule an ER f/u appt before any medication will be prescribed

## 2021-03-09 NOTE — Telephone Encounter (Signed)
lvm for patient to call to schedule ER/follow - per Dr. Beverely Low

## 2021-03-15 ENCOUNTER — Ambulatory Visit: Payer: BC Managed Care – PPO | Admitting: Family Medicine

## 2021-03-15 ENCOUNTER — Encounter: Payer: Self-pay | Admitting: Family Medicine

## 2021-03-15 ENCOUNTER — Other Ambulatory Visit: Payer: Self-pay

## 2021-03-15 DIAGNOSIS — M7989 Other specified soft tissue disorders: Secondary | ICD-10-CM

## 2021-03-15 MED ORDER — FUROSEMIDE 20 MG PO TABS: 20.0000 mg | ORAL_TABLET | Freq: Every day | ORAL | 0 refills | Status: DC

## 2021-03-15 NOTE — Patient Instructions (Signed)
Follow up as needed or as scheduled No need for labs today- YAY! Continue the Lasix daily Make sure you are limiting your salt intake and drink LOTS of water Continue to work on healthy diet and regular exercise- you can do it! Call with any questions or concerns Hang in there!!!

## 2021-03-15 NOTE — Progress Notes (Signed)
   Subjective:    Patient ID: Mikayla [name], female    DOB: 07-05-1966, 55 y.o.   MRN: [id number]  HPI ER f/u- pt went to ER on 4/15 due to LE edema.  At the time, she had mild swelling and was tx'd w/ low dose Lasix and compression socks.  Labs were WNL and not suggestive of liver or kidney disease.  She did not have SOB to indicate CHF or pulmonary issue- but no xray was done.  Based on ECHO in 2016 she did have Grade 1 Diastolic Dysfxn at that time.  Pt reports that within 2 days of starting Lasix she reports leg swelling had resolved and they no longer felt tight.  Since she ran out of the pills, she again has some swelling and 'pressure on the legs'.  No SOB.  No coughing or wheezing at night.  Pt denies change in diet- says no increased Na intake.   Review of Systems For ROS see HPI   This visit occurred during the SARS-CoV-2 public health emergency.  Safety protocols were in place, including screening questions prior to the visit, additional usage of staff PPE, and extensive cleaning of exam room while observing appropriate contact time as indicated for disinfecting solutions.       Objective:   Physical Exam Vitals reviewed.  Constitutional:      General: She is not in acute distress.    Appearance: Normal appearance. She is well-developed. She is obese. She is not ill-appearing.  HENT:     Head: Normocephalic and atraumatic.  Eyes:     Conjunctiva/sclera: Conjunctivae normal.     Pupils: Pupils are equal, round, and reactive to light.  Neck:     Thyroid: No thyromegaly.  Cardiovascular:     Rate and Rhythm: Normal rate and regular rhythm.     Pulses: Normal pulses.     Heart sounds: Normal heart sounds. No murmur heard.   Pulmonary:     Effort: Pulmonary effort is normal. No respiratory distress.     Breath sounds: Normal breath sounds.  Abdominal:     General: There is no distension.     Palpations: Abdomen is soft.     Tenderness: There is no abdominal tenderness.   Musculoskeletal:     Cervical back: Normal range of motion and neck supple.     Right lower leg: No edema.     Left lower leg: No edema.  Lymphadenopathy:     Cervical: No cervical adenopathy.  Skin:    General: Skin is warm and dry.  Neurological:     General: No focal deficit present.     Mental Status: She is alert and oriented to person, place, and time.  Psychiatric:        Mood and Affect: Mood normal.        Behavior: Behavior normal.        Thought Content: Thought content normal.           Assessment & Plan:

## 2021-03-21 NOTE — Assessment & Plan Note (Signed)
Pt's BMI is 50.3 and this is likely contributing to her lower extremity edema.  Again discussed low sodium, low carb diet and regular exercise.  Will follow.

## 2021-03-21 NOTE — Assessment & Plan Note (Signed)
New.  No obvious swelling on exam today and per ER note they felt that swelling was mild.  She denies any CP, SOB, cough, wheezing, orthopnea.  Will refill Lasix today w/ understanding that if swelling continues or worsens she will need to repeat ECHO/cardiology evaluation.  Pt expressed understanding and is in agreement w/ plan.

## 2021-03-22 DIAGNOSIS — N952 Postmenopausal atrophic vaginitis: Secondary | ICD-10-CM | POA: Diagnosis not present

## 2021-03-22 DIAGNOSIS — N3941 Urge incontinence: Secondary | ICD-10-CM | POA: Diagnosis not present

## 2021-03-22 DIAGNOSIS — Z87440 Personal history of urinary (tract) infections: Secondary | ICD-10-CM | POA: Diagnosis not present

## 2021-03-22 DIAGNOSIS — R3914 Feeling of incomplete bladder emptying: Secondary | ICD-10-CM | POA: Diagnosis not present

## 2021-03-31 ENCOUNTER — Ambulatory Visit: Payer: BC Managed Care – PPO | Admitting: Family Medicine

## 2021-03-31 ENCOUNTER — Other Ambulatory Visit: Payer: Self-pay

## 2021-03-31 ENCOUNTER — Ambulatory Visit (INDEPENDENT_AMBULATORY_CARE_PROVIDER_SITE_OTHER): Payer: BC Managed Care – PPO

## 2021-03-31 VITALS — BP 112/71 | HR 78 | Ht 62.0 in | Wt 280.6 lb

## 2021-03-31 DIAGNOSIS — M5136 Other intervertebral disc degeneration, lumbar region: Secondary | ICD-10-CM | POA: Diagnosis not present

## 2021-03-31 DIAGNOSIS — M5432 Sciatica, left side: Secondary | ICD-10-CM

## 2021-03-31 DIAGNOSIS — M47816 Spondylosis without myelopathy or radiculopathy, lumbar region: Secondary | ICD-10-CM | POA: Diagnosis not present

## 2021-03-31 MED ORDER — GABAPENTIN 300 MG PO CAPS: 300.0000 mg | ORAL_CAPSULE | Freq: Three times a day (TID) | ORAL | 3 refills | Status: DC | PRN

## 2021-03-31 MED ORDER — PREDNISONE 50 MG PO TABS: 50.0000 mg | ORAL_TABLET | Freq: Every day | ORAL | 0 refills | Status: DC

## 2021-03-31 NOTE — Progress Notes (Signed)
I, Philbert Riser, LAT, ATC acting as a scribe for Mikayla Graham, MD.  Mikayla [name] is a 55 y.o. female who presents to [facility] today for low back and B leg pain, R>L. She has had prior lumbar epidurals while under the care of Dr. Farris Has at [facility] in Nov 2020. Pt previously had a virtual visit w/ Dr. Denyse Amass on 05/20/20 for R-sided sciatica/S1 and L4 radiculopathy and an epidural steroid injection was ordered, but never completed. Today, pt locates pain to over the past 3-4 days pain has worsened. Pt locates pain to deep in L buttock.   Radiating pain: yes- sometimes down L leg LE numbness/tingling: yes- into stomach area and L leg LE weakness: sometimes Aggravates: sleeping, walking, lifting things, bending Treatments tried: naproxen, heat  Dx imaging: 01/03/19 L-spine MRI  10/02/06 R & L hip XR  Pertinent review of systems: No fevers or chills  Relevant historical information: Morbid obesity.  Hyperlipidemia.   Exam:  BP 112/71 (BP Location: Right Arm, Patient Position: Sitting, Cuff Size: Normal)   Pulse 78   Ht 5\' 2"  (1.575 m)   Wt 280 lb 9.6 oz (127.3 kg)   SpO2 95%   BMI 51.32 kg/m  General: Well Developed, well nourished, and in no acute distress.   MSK: L-spine normal.  Nontender midline. Normal lumbar motion.  Positive left-sided slump test. Lower extremity strength 4/5 left hip flexion otherwise intact. Reflexes are intact. Sensation is intact.    Lab and Radiology Results  X-ray images L-spine obtained today personally and independently interpreted  Anterior listhesis L4-L5.  No fractures or severe malalignment.  Degenerative changes throughout. Await formal radiology review  EXAM: MRI LUMBAR SPINE WITHOUT CONTRAST  TECHNIQUE: Multiplanar, multisequence MR imaging of the lumbar spine was performed. No intravenous contrast was administered.  COMPARISON:  None available.  FINDINGS: Segmentation: Standard.  Lowest well-formed disc labeled the L5-S1 level.  Alignment: 3 mm anterolisthesis of L4 on L5 and L5 on S1. Alignment otherwise normal with preservation of the normal lumbar lordosis.  Vertebrae: Vertebral body heights maintained without evidence for acute or chronic fracture. Bone marrow signal intensity within normal limits. Prominent hemangioma noted within the T10 vertebral body. No other discrete or worrisome osseous lesions. No abnormal marrow edema.  Conus medullaris and cauda equina: Conus extends to the L1 level. Conus and cauda equina appear normal.  Paraspinal and other soft tissues: Paraspinous soft tissues within normal limits. Visualized visceral structures unremarkable.  Disc levels:  T11-12: Mild annular disc bulge.  No stenosis.  T12-L1: Unremarkable.  L1-2:  Unremarkable.  L2-3: Negative interspace. Mild bilateral facet hypertrophy. No stenosis.  L3-4: Mild diffuse disc bulge with disc desiccation and intervertebral disc space narrowing. Mild bilateral facet hypertrophy. No significant canal or foraminal stenosis.  L4-5: 3 mm anterolisthesis. Associated mild diffuse disc bulge. Advanced bilateral facet arthrosis. No significant canal or lateral recess stenosis. Foramina remain patent.  L5-S1: 3 mm anterolisthesis. Associated shallow posterior disc bulge. There is a superimposed tiny right central/subarticular disc protrusion (series 6, image 8). Protruding disc appears to contact the descending right S1 nerve root which is minimally displaced posteriorly (series 11, image 37). Superimposed advanced bilateral facet arthrosis. Mild epidural lipomatosis. No significant canal or lateral recess stenosis. Foramina remain patent.  IMPRESSION: 1. Tiny right central/subarticular disc protrusion at L5-S1, contacting and potentially affecting the descending right S1 nerve root. 2. 3 mm anterolisthesis of L4 on L5 and L5 on S1 with  associated advanced bilateral facet arthropathy. No significant stenosis at these levels. 3. Mild noncompressive disc bulging at L3-4 without significant stenosis or neural impingement.   Electronically Signed   By: Rise Mu M.D.   On: 01/05/2019 07:06   I, Mikayla [name], personally (independently) visualized and performed the interpretation of the images attached in this note.     Assessment and Plan: 55 y.o. female with left-sided lumbar radiculopathy at L2 or L3 and S1.  This is associated with some weakness to left hip flexion which does correspond to L2 or L3.  Left-sided symptoms are new.  She had right-sided symptoms in the past.  She had benefit previously with right lumbar epidural steroid injections.  Plan today for course of prednisone and gabapentin.  Plan to proceed with MRI for potential epidural steroid injection planning and to further evaluate source of weakness.  Additionally prescribed home exercise program as well.  Recheck after MRI.   PDMP not reviewed this encounter. Orders Placed This Encounter  Procedures  . DG Lumbar Spine 2-3 Views    Standing Status:   Future    Number of Occurrences:   1    Standing Expiration Date:   03/31/2022    Order Specific Question:   Reason for Exam (SYMPTOM  OR DIAGNOSIS REQUIRED)    Answer:   eval lumbar raduclopathy left L3 and S1    Order Specific Question:   Is patient pregnant?    Answer:   No    Order Specific Question:   Preferred imaging location?    Answer:   [facility]  . MR Lumbar Spine Wo Contrast    Standing Status:   Future    Standing Expiration Date:   03/31/2022    Order Specific Question:   What is the patient's sedation requirement?    Answer:   No Sedation    Order Specific Question:   Does the patient have a pacemaker or implanted devices?    Answer:   No    Order Specific Question:   Preferred imaging location?    Answer:   [facility] (table limit-350lbs)   Meds  ordered this encounter  Medications  . predniSONE (DELTASONE) 50 MG tablet    Sig: Take 1 tablet (50 mg total) by mouth daily.    Dispense:  5 tablet    Refill:  0  . gabapentin (NEURONTIN) 300 MG capsule    Sig: Take 1 capsule (300 mg total) by mouth 3 (three) times daily as needed.    Dispense:  90 capsule    Refill:  3     Discussed warning signs or symptoms. Please see discharge instructions. Patient expresses understanding.   The above documentation has been reviewed and is accurate and complete Mikayla [name], M.D.

## 2021-03-31 NOTE — Patient Instructions (Addendum)
Thank you for coming in today.  Please get an Xray today before you leave  You should hear from MRI scheduling within 1 week. If you do not hear please let me know.   Take the prednisone now.   Use gabapentin as needed for nerve pain mostly at bedtime.   Recheck after the MRI.    Sciatica Rehab Ask your health care provider which exercises are safe for you. Do exercises exactly as told by your health care provider and adjust them as directed. It is normal to feel mild stretching, pulling, tightness, or discomfort as you do these exercises. Stop right away if you feel sudden pain or your pain gets worse. Do not begin these exercises until told by your health care provider. Stretching and range-of-motion exercises These exercises warm up your muscles and joints and improve the movement and flexibility of your hips and back. These exercises also help to relieve pain, numbness, and tingling. Sciatic nerve glide 1. Sit in a chair with your head facing down toward your chest. Place your hands behind your back. Let your shoulders slump forward. 2. Slowly straighten one of your legs while you tilt your head back as if you are looking toward the ceiling. Only straighten your leg as far as you can without making your symptoms worse. 3. Hold this position for __________ seconds. 4. Slowly return to the starting position. 5. Repeat with your other leg. Repeat __________ times. Complete this exercise __________ times a day. Knee to chest with hip adduction and internal rotation 1. Lie on your back on a firm surface with both legs straight. 2. Bend one of your knees and move it up toward your chest until you feel a gentle stretch in your lower back and buttock. Then, move your knee toward the shoulder that is on the opposite side from your leg. This is hip adduction and internal rotation. ? Hold your leg in this position by holding on to the front of your knee. 3. Hold this position for __________  seconds. 4. Slowly return to the starting position. 5. Repeat with your other leg. Repeat __________ times. Complete this exercise __________ times a day.   Prone extension on elbows 1. Lie on your abdomen on a firm surface. A bed may be too soft for this exercise. 2. Prop yourself up on your elbows. 3. Use your arms to help lift your chest up until you feel a gentle stretch in your abdomen and your lower back. ? This will place some of your body weight on your elbows. If this is uncomfortable, try stacking pillows under your chest. ? Your hips should stay down, against the surface that you are lying on. Keep your hip and back muscles relaxed. 4. Hold this position for __________ seconds. 5. Slowly relax your upper body and return to the starting position. Repeat __________ times. Complete this exercise __________ times a day.   Strengthening exercises These exercises build strength and endurance in your back. Endurance is the ability to use your muscles for a long time, even after they get tired. Pelvic tilt This exercise strengthens the muscles that lie deep in the abdomen. 1. Lie on your back on a firm surface. Bend your knees and keep your feet flat on the floor. 2. Tense your abdominal muscles. Tip your pelvis up toward the ceiling and flatten your lower back into the floor. ? To help with this exercise, you may place a small towel under your lower back and try to  push your back into the towel. 3. Hold this position for __________ seconds. 4. Let your muscles relax completely before you repeat this exercise. Repeat __________ times. Complete this exercise __________ times a day. Alternating arm and leg raises 1. Get on your hands and knees on a firm surface. If you are on a hard floor, you may want to use padding, such as an exercise mat, to cushion your knees. 2. Line up your arms and legs. Your hands should be directly below your shoulders, and your knees should be directly below your  hips. 3. Lift your left leg behind you. At the same time, raise your right arm and straighten it in front of you. ? Do not lift your leg higher than your hip. ? Do not lift your arm higher than your shoulder. ? Keep your abdominal and back muscles tight. ? Keep your hips facing the ground. ? Do not arch your back. ? Keep your balance carefully, and do not hold your breath. 4. Hold this position for __________ seconds. 5. Slowly return to the starting position. 6. Repeat with your right leg and your left arm. Repeat __________ times. Complete this exercise __________ times a day.   Posture and body mechanics Good posture and healthy body mechanics can help to relieve stress in your body's tissues and joints. Body mechanics refers to the movements and positions of your body while you do your daily activities. Posture is part of body mechanics. Good posture means:  Your spine is in its natural S-curve position (neutral).  Your shoulders are pulled back slightly.  Your head is not tipped forward. Follow these guidelines to improve your posture and body mechanics in your everyday activities. Standing  When standing, keep your spine neutral and your feet about hip width apart. Keep a slight bend in your knees. Your ears, shoulders, and hips should line up.  When you do a task in which you stand in one place for a long time, place one foot up on a stable object that is 2-4 inches (5-10 cm) high, such as a footstool. This helps keep your spine neutral.   Sitting  When sitting, keep your spine neutral and keep your feet flat on the floor. Use a footrest, if necessary, and keep your thighs parallel to the floor. Avoid rounding your shoulders, and avoid tilting your head forward.  When working at a desk or a computer, keep your desk at a height where your hands are slightly lower than your elbows. Slide your chair under your desk so you are close enough to maintain good posture.  When working at  a computer, place your monitor at a height where you are looking straight ahead and you do not have to tilt your head forward or downward to look at the screen.   Resting  When lying down and resting, avoid positions that are most painful for you.  If you have pain with activities such as sitting, bending, stooping, or squatting, lie in a position in which your body does not bend very much. For example, avoid curling up on your side with your arms and knees near your chest (fetal position).  If you have pain with activities such as standing for a long time or reaching with your arms, lie with your spine in a neutral position and bend your knees slightly. Try the following positions: ? Lying on your side with a pillow between your knees. ? Lying on your back with a pillow under your knees.  Lifting  When lifting objects, keep your feet at least shoulder width apart and tighten your abdominal muscles.  Bend your knees and hips and keep your spine neutral. It is important to lift using the strength of your legs, not your back. Do not lock your knees straight out.  Always ask for help to lift heavy or awkward objects.   This information is not intended to replace advice given to you by your health care provider. Make sure you discuss any questions you have with your health care provider. Document Revised: 02/27/2019 Document Reviewed: 11/27/2018 Else[employer] Patient Education  [employer].

## 2021-04-03 NOTE — Progress Notes (Signed)
Anterior listhesis at L4 on L5.  This means the spine is shifted a bit forward.  This could cause nerves to get pinched.  Additionally there is some arthritis changes in the back.  We will see this all much better on MRI also ordered.

## 2021-04-05 ENCOUNTER — Ambulatory Visit: Payer: BC Managed Care – PPO | Admitting: Family Medicine

## 2021-04-09 ENCOUNTER — Other Ambulatory Visit: Payer: Self-pay

## 2021-04-09 ENCOUNTER — Ambulatory Visit (INDEPENDENT_AMBULATORY_CARE_PROVIDER_SITE_OTHER): Payer: BC Managed Care – PPO

## 2021-04-09 DIAGNOSIS — M545 Low back pain, unspecified: Secondary | ICD-10-CM | POA: Diagnosis not present

## 2021-04-09 DIAGNOSIS — M5432 Sciatica, left side: Secondary | ICD-10-CM

## 2021-04-11 NOTE — Progress Notes (Signed)
I, Christoper Fabian, LAT, ATC, am serving as scribe for Dr. [name].  Mikayla [name] is a 55 y.o. female who presents to [facility] today for f/u of L-sided low back/buttock pain and L-spine MRI review.  She was last seen by Dr. Denyse Amass on 03/31/21 and was prescribed gabapentin and prednisone and referred for an L-spine MRI.  Since her last visit, pt reports that her low back pain is a little better.  She has finished her prednisone and is taking her Gabapentin intermittently.  She locates her pain to her L buttock and reports some intermittent radiating pain into her L post thigh.  Diagnostic imaging: L-spine MRI- 04/09/21; L-spine XR- 03/31/21  Pertinent review of systems: No fevers or chills  Relevant historical information: Obesity   Exam:  BP 104/70 (BP Location: Left Arm, Patient Position: Sitting, Cuff Size: Large)   Pulse 83   Ht 5\' 2"  (1.575 m)   Wt 280 lb 6.4 oz (127.2 kg)   SpO2 95%   BMI 51.29 kg/m  General: Well Developed, well nourished, and in no acute distress.   MSK: L-spine nontender midline decreased lumbar motion. Left hip normal-appearing Normal motion. Tender palpation greater trochanter.  Reduced hip abduction strength with pain.  External rotation strength reduced with pain.    Lab and Radiology Results No results found for this or any previous visit (from the past 72 hour(s)). MR Lumbar Spine Wo Contrast  Result Date: 04/11/2021 CLINICAL DATA:  Chronic left hip pain radiating down the legs. EXAM: MRI LUMBAR SPINE WITHOUT CONTRAST TECHNIQUE: Multiplanar, multisequence MR imaging of the lumbar spine was performed. No intravenous contrast was administered. COMPARISON:  01/03/2019 FINDINGS: Segmentation: 5 lumbar type vertebrae based on the available coverage in prior numbering Alignment: Exaggerated lumbar lordosis with grade 1 anterolisthesis at L4 and L5. Vertebrae: No fracture, evidence of discitis, or aggressive bone lesion.  Conus medullaris and cauda equina: Conus extends to the L1 level. Conus and cauda equina appear normal. Paraspinal and other soft tissues: Negative Disc levels: T12- L1: Unremarkable. L1-L2: Unremarkable. L2-L3: Minor disc narrowing with a small left paracentral protrusion suggested on sagittal images. L3-L4:Minor disc bulging. Mild facet spurring. L4-L5: Degenerative facet spurring with anterolisthesis. Mild disc narrowing and bulging. L5-S1:Degenerative facet spurring with anterolisthesis. Mild disc narrowing and bulging. Regressed right paracentral protrusion. IMPRESSION: 1. No progression since 2020. 2. 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 3. No neural compression. Electronically Signed   By: 2021 M.D.   On: 04/11/2021 05:22  I, 04/13/2021, personally (independently) visualized and performed the interpretation of the images attached in this note.   Hip greater trochanteric injection: Left Consent obtained and timeout performed. Area of maximum tenderness palpated and identified. Skin cleaned with alcohol, cold spray applied. A spinal needle was used to access the greater trochanteric bursa. 40 mg of Kenalog and 2 mL of Marcaine were used to inject the trochanteric bursa. Patient tolerated the procedure well.    Assessment and Plan: 55 y.o. female with multifactorial left low back, buttocks, and leg pain.  Patient does not seem to have much of a lumbar radicular component.  Originally I thought perhaps she had an L2 or 3 lumbar radiculopathy but this appears less likely based on the results of the lumbar MRI.  Certainly some of the pain that she is experiencing is likely coming from her lumbar spine specifically the facet joints around L4-5 L5-S1.  I think this is fair amount  of pain is also originating from the greater trochanter bursa and tendons.  Plan to treat with physical therapy, and greater trochanter injection today.  Recheck in 1 month.  If not  improved would consider facet injections.  I am optimistic that physical therapy will be helpful.   PDMP not reviewed this encounter. Orders Placed This Encounter  Procedures  . Ambulatory referral to Physical Therapy    Referral Priority:   Routine    Referral Type:   Physical Medicine    Referral Reason:   Specialty Services Required    Requested Specialty:   Physical Therapy   No orders of the defined types were placed in this encounter.    Discussed warning signs or symptoms. Please see discharge instructions. Patient expresses understanding.   The above documentation has been reviewed and is accurate and complete [name], M.D.

## 2021-04-11 NOTE — Progress Notes (Signed)
MRI of your back shows some arthritis but does not show significant nerve compression and does not look significantly worse since 2020.  Since her symptoms are quite significant I do think you should have a follow-up appointment with me in the near future to go over the MRI repeat your physical exam and discuss what our next steps are.

## 2021-04-12 ENCOUNTER — Ambulatory Visit: Payer: BC Managed Care – PPO | Admitting: Family Medicine

## 2021-04-12 ENCOUNTER — Encounter: Payer: Self-pay | Admitting: Family Medicine

## 2021-04-12 ENCOUNTER — Other Ambulatory Visit: Payer: Self-pay

## 2021-04-12 VITALS — BP 104/70 | HR 83 | Ht 62.0 in | Wt 280.4 lb

## 2021-04-12 DIAGNOSIS — M7062 Trochanteric bursitis, left hip: Secondary | ICD-10-CM | POA: Diagnosis not present

## 2021-04-12 DIAGNOSIS — M545 Low back pain, unspecified: Secondary | ICD-10-CM

## 2021-04-12 NOTE — Patient Instructions (Signed)
Thank you for coming in today.  I've referred you to Physical Therapy.  Let us know if you don't hear from them in one week.  Call or go to the ER if you develop a large red swollen joint with extreme pain or oozing puss.   Recheck with me in 1 month.   Let me know if this is not working.   Massage therapy is ok.   Heat is good. Swimming may be good.

## 2021-04-19 ENCOUNTER — Encounter: Payer: Self-pay | Admitting: Physical Therapy

## 2021-04-19 ENCOUNTER — Other Ambulatory Visit: Payer: Self-pay

## 2021-04-19 ENCOUNTER — Ambulatory Visit (INDEPENDENT_AMBULATORY_CARE_PROVIDER_SITE_OTHER): Payer: BC Managed Care – PPO | Admitting: Physical Therapy

## 2021-04-19 DIAGNOSIS — R262 Difficulty in walking, not elsewhere classified: Secondary | ICD-10-CM | POA: Diagnosis not present

## 2021-04-19 DIAGNOSIS — M545 Low back pain, unspecified: Secondary | ICD-10-CM | POA: Diagnosis not present

## 2021-04-19 DIAGNOSIS — M6281 Muscle weakness (generalized): Secondary | ICD-10-CM
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
## 2021-04-19 NOTE — Patient Instructions (Signed)
Access Code: [email or web address] URL: [email or web address] Date: 04/19/2021 Prepared by: Zebedee Iba  Exercises Supine Posterior Pelvic Tilt - 2 x daily - 7 x weekly - 2 sets - 10 reps - 2 hold Seated Flexion Stretch with Swiss Ball - 2 x daily - 7 x weekly - 2 sets - 10 reps - 10 hold Seated Hip Abduction - 2 x daily - 7 x weekly - 2 sets - 10 reps

## 2021-04-21 ENCOUNTER — Ambulatory Visit: Payer: BC Managed Care – PPO | Admitting: Family Medicine

## 2021-04-25 ENCOUNTER — Other Ambulatory Visit: Payer: Self-pay

## 2021-04-25 ENCOUNTER — Encounter: Payer: Self-pay | Admitting: Family Medicine

## 2021-04-25 ENCOUNTER — Ambulatory Visit: Payer: BC Managed Care – PPO | Admitting: Family Medicine

## 2021-04-25 DIAGNOSIS — E8881 Metabolic syndrome: Secondary | ICD-10-CM

## 2021-04-25 DIAGNOSIS — E7849 Other hyperlipidemia: Secondary | ICD-10-CM

## 2021-04-25 LAB — LIPID PANEL
Cholesterol: 212 mg/dL — ABNORMAL HIGH (ref 0–200)
HDL: 58.8 mg/dL (ref >39.00)
LDL Cholesterol: 137 mg/dL — ABNORMAL HIGH (ref 0–99)
NonHDL: 153.08
Total CHOL/HDL Ratio: 4
Triglycerides: 81 mg/dL (ref 0.0–149.0)
VLDL: 16.2 mg/dL (ref 0.0–40.0)

## 2021-04-25 LAB — CBC WITH DIFFERENTIAL/PLATELET
Basophils Absolute: 0 10*3/uL (ref 0.0–0.1)
Basophils Relative: 0.4 % (ref 0.0–3.0)
Eosinophils Absolute: 0.1 10*3/uL (ref 0.0–0.7)
Eosinophils Relative: 2.4 % (ref 0.0–5.0)
HCT: 38.8 % (ref 36.0–46.0)
Hemoglobin: 12.7 g/dL (ref 12.0–15.0)
Lymphocytes Relative: 30.8 % (ref 12.0–46.0)
Lymphs Abs: 1.8 10*3/uL (ref 0.7–4.0)
MCHC: 32.8 g/dL (ref 30.0–36.0)
MCV: 90.8 fl (ref 78.0–100.0)
Monocytes Absolute: 0.5 10*3/uL (ref 0.1–1.0)
Monocytes Relative: 8.6 % (ref 3.0–12.0)
Neutro Abs: 3.4 10*3/uL (ref 1.4–7.7)
Neutrophils Relative %: 57.8 % (ref 43.0–77.0)
Platelets: 180 10*3/uL (ref 150.0–400.0)
RBC: 4.27 Mil/uL (ref 3.87–5.11)
RDW: 14.2 % (ref 11.5–15.5)
WBC: 5.9 10*3/uL (ref 4.0–10.5)

## 2021-04-25 LAB — BASIC METABOLIC PANEL
BUN: 17 mg/dL (ref 6–23)
CO2: 27 mEq/L (ref 19–32)
Calcium: 8.8 mg/dL (ref 8.4–10.5)
Chloride: 104 mEq/L (ref 96–112)
Creatinine, Ser: 0.61 mg/dL (ref 0.40–1.20)
GFR: 99.61 mL/min (ref >60.00)
Glucose, Bld: 75 mg/dL (ref 70–99)
Potassium: 4.2 mEq/L (ref 3.5–5.1)
Sodium: 141 mEq/L (ref 135–145)

## 2021-04-25 LAB — HEPATIC FUNCTION PANEL
ALT: 15 U/L (ref 0–35)
AST: 11 U/L (ref 0–37)
Albumin: 3.7 g/dL (ref 3.5–5.2)
Alkaline Phosphatase: 84 U/L (ref 39–117)
Bilirubin, Direct: 0.1 mg/dL (ref 0.0–0.3)
Total Bilirubin: 0.4 mg/dL (ref 0.2–1.2)
Total Protein: 6.4 g/dL (ref 6.0–8.3)

## 2021-04-25 LAB — HEMOGLOBIN A1C: Hgb A1c MFr Bld: 6.1 % (ref 4.6–6.5)

## 2021-04-25 LAB — TSH: TSH: 3.1 u[IU]/mL (ref 0.35–4.50)

## 2021-04-25 NOTE — Patient Instructions (Signed)
Follow up in 8 weeks to recheck weight loss progress We'll notify you of your lab results and make any changes if needed Try and follow a low carb diet and add regular physical activity such as walking Call and schedule your mammogram at the breast center Call with any questions or concerns Hang in there!!!

## 2021-04-25 NOTE — Assessment & Plan Note (Signed)
Pt's last A1C was 6.2%  She admits to poor dietary habits and lack of exercise.  Since she is morbidly obese and in the pre-diabetes range, we will likely start Ozempic once her labs are available for review.  Discussed the need for a low carb diet and regular physical activity in addition to the medication.  Will follow.

## 2021-04-25 NOTE — Assessment & Plan Note (Signed)
Ongoing issue for pt.  BMI is 51.21.  Again discussed need for healthy diet and regular exercise.  If labs allow, plan is to start Ozempic to improve insulin resistance and assist w/ weight loss.

## 2021-04-25 NOTE — Assessment & Plan Note (Signed)
Ongoing issue for pt.  Last LDL 161 but she did not take the statin as prescribed.  Check labs and start medication to lower this.

## 2021-04-25 NOTE — Progress Notes (Signed)
   Subjective:    Patient ID: Mikayla [name], female    DOB: 09-22-1966, 55 y.o.   MRN: [id number]  HPI Hyperlipidemia- last LDL was 161 but pt is not on any medication.  Pt reports she took a statin for a short period of time but had muscle and joint pains.  Wasn't sure if they were related but stopped medication.  Denies CP, SOB, abd pain, N/V.  Obesity- pt's BMI is 51.21  No regular exercise but she did start PT for back pain and they are going to add water therapy.  Is interested in a medication to assist w/ weight loss.  Pre-diabetes- A1C in October was 6.2%  Not following a particular diet at this time.   Review of Systems For ROS see HPI   This visit occurred during the SARS-CoV-2 public health emergency.  Safety protocols were in place, including screening questions prior to the visit, additional usage of staff PPE, and extensive cleaning of exam room while observing appropriate contact time as indicated for disinfecting solutions.       Objective:   Physical Exam Vitals reviewed.  Constitutional:      General: She is not in acute distress.    Appearance: Normal appearance. She is well-developed. She is obese.  HENT:     Head: Normocephalic and atraumatic.  Eyes:     Conjunctiva/sclera: Conjunctivae normal.     Pupils: Pupils are equal, round, and reactive to light.  Neck:     Thyroid: No thyromegaly.  Cardiovascular:     Rate and Rhythm: Normal rate and regular rhythm.     Pulses: Normal pulses.     Heart sounds: Normal heart sounds. No murmur heard.   Pulmonary:     Effort: Pulmonary effort is normal. No respiratory distress.     Breath sounds: Normal breath sounds.  Abdominal:     General: There is no distension.     Palpations: Abdomen is soft.     Tenderness: There is no abdominal tenderness.  Musculoskeletal:     Cervical back: Normal range of motion and neck supple.     Right lower leg: No edema.     Left lower leg: No edema.  Lymphadenopathy:      Cervical: No cervical adenopathy.  Skin:    General: Skin is warm and dry.  Neurological:     Mental Status: She is alert and oriented to person, place, and time.  Psychiatric:        Behavior: Behavior normal.           Assessment & Plan:

## 2021-04-26 ENCOUNTER — Encounter: Payer: BC Managed Care – PPO | Admitting: Physical Therapy

## 2021-04-26 ENCOUNTER — Encounter (HOSPITAL_BASED_OUTPATIENT_CLINIC_OR_DEPARTMENT_OTHER): Payer: Self-pay | Admitting: Physical Therapy

## 2021-04-26 ENCOUNTER — Ambulatory Visit (HOSPITAL_BASED_OUTPATIENT_CLINIC_OR_DEPARTMENT_OTHER): Payer: BC Managed Care – PPO | Attending: Family Medicine | Admitting: Physical Therapy

## 2021-04-26 DIAGNOSIS — M545 Low back pain, unspecified: Secondary | ICD-10-CM | POA: Insufficient documentation

## 2021-04-26 DIAGNOSIS — M7062 Trochanteric bursitis, left hip: Secondary | ICD-10-CM | POA: Diagnosis not present
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
## 2021-04-28 ENCOUNTER — Encounter: Payer: BC Managed Care – PPO | Admitting: Physical Therapy

## 2021-04-28 ENCOUNTER — Other Ambulatory Visit: Payer: Self-pay

## 2021-04-28 DIAGNOSIS — E8881 Metabolic syndrome: Secondary | ICD-10-CM

## 2021-04-28 MED ORDER — OZEMPIC (0.25 OR 0.5 MG/DOSE) 2 MG/1.5ML ~~LOC~~ SOPN: 0.2500 mg | PEN_INJECTOR | SUBCUTANEOUS | 0 refills | Status: DC

## 2021-05-01 ENCOUNTER — Ambulatory Visit (HOSPITAL_BASED_OUTPATIENT_CLINIC_OR_DEPARTMENT_OTHER): Payer: BC Managed Care – PPO | Admitting: Physical Therapy

## 2021-05-01 ENCOUNTER — Other Ambulatory Visit: Payer: Self-pay

## 2021-05-01 ENCOUNTER — Encounter (HOSPITAL_BASED_OUTPATIENT_CLINIC_OR_DEPARTMENT_OTHER): Payer: Self-pay | Admitting: Physical Therapy

## 2021-05-01 DIAGNOSIS — M545 Low back pain, unspecified: Secondary | ICD-10-CM | POA: Diagnosis not present

## 2021-05-01 DIAGNOSIS — M7062 Trochanteric bursitis, left hip: Secondary | ICD-10-CM

## 2021-05-02 ENCOUNTER — Encounter (HOSPITAL_BASED_OUTPATIENT_CLINIC_OR_DEPARTMENT_OTHER): Payer: Self-pay | Admitting: Physical Therapy
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
## 2021-05-04 ENCOUNTER — Ambulatory Visit (HOSPITAL_BASED_OUTPATIENT_CLINIC_OR_DEPARTMENT_OTHER): Payer: BC Managed Care – PPO | Admitting: Physical Therapy

## 2021-05-08 ENCOUNTER — Other Ambulatory Visit: Payer: Self-pay

## 2021-05-08 ENCOUNTER — Ambulatory Visit (HOSPITAL_BASED_OUTPATIENT_CLINIC_OR_DEPARTMENT_OTHER): Payer: BC Managed Care – PPO | Admitting: Physical Therapy

## 2021-05-08 ENCOUNTER — Encounter (HOSPITAL_BASED_OUTPATIENT_CLINIC_OR_DEPARTMENT_OTHER): Payer: Self-pay | Admitting: Physical Therapy

## 2021-05-08 DIAGNOSIS — M7062 Trochanteric bursitis, left hip: Secondary | ICD-10-CM

## 2021-05-08 DIAGNOSIS — M545 Low back pain, unspecified: Secondary | ICD-10-CM
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
## 2021-05-11 ENCOUNTER — Other Ambulatory Visit: Payer: Self-pay

## 2021-05-11 ENCOUNTER — Ambulatory Visit (HOSPITAL_BASED_OUTPATIENT_CLINIC_OR_DEPARTMENT_OTHER): Payer: BC Managed Care – PPO | Admitting: Physical Therapy

## 2021-05-11 ENCOUNTER — Encounter (HOSPITAL_BASED_OUTPATIENT_CLINIC_OR_DEPARTMENT_OTHER): Payer: Self-pay | Admitting: Physical Therapy

## 2021-05-11 DIAGNOSIS — M545 Low back pain, unspecified: Secondary | ICD-10-CM

## 2021-05-11 DIAGNOSIS — M7062 Trochanteric bursitis, left hip: Secondary | ICD-10-CM

## 2021-05-12 ENCOUNTER — Encounter (HOSPITAL_BASED_OUTPATIENT_CLINIC_OR_DEPARTMENT_OTHER): Payer: Self-pay | Admitting: Physical Therapy
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
## 2021-05-16 ENCOUNTER — Other Ambulatory Visit: Payer: Self-pay

## 2021-05-16 ENCOUNTER — Ambulatory Visit (HOSPITAL_BASED_OUTPATIENT_CLINIC_OR_DEPARTMENT_OTHER): Payer: BC Managed Care – PPO | Admitting: Physical Therapy

## 2021-05-16 ENCOUNTER — Encounter (HOSPITAL_BASED_OUTPATIENT_CLINIC_OR_DEPARTMENT_OTHER): Payer: Self-pay | Admitting: Physical Therapy

## 2021-05-16 DIAGNOSIS — M545 Low back pain, unspecified: Secondary | ICD-10-CM | POA: Diagnosis not present

## 2021-05-16 DIAGNOSIS — M7062 Trochanteric bursitis, left hip: Secondary | ICD-10-CM
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
## 2021-05-17 ENCOUNTER — Ambulatory Visit (HOSPITAL_BASED_OUTPATIENT_CLINIC_OR_DEPARTMENT_OTHER): Payer: BC Managed Care – PPO | Admitting: Physical Therapy

## 2021-05-17 ENCOUNTER — Encounter: Payer: Self-pay | Admitting: *Deleted

## 2021-05-19 ENCOUNTER — Ambulatory Visit (HOSPITAL_BASED_OUTPATIENT_CLINIC_OR_DEPARTMENT_OTHER): Payer: BC Managed Care – PPO | Admitting: Physical Therapy

## 2021-05-23 ENCOUNTER — Ambulatory Visit (HOSPITAL_BASED_OUTPATIENT_CLINIC_OR_DEPARTMENT_OTHER): Payer: BC Managed Care – PPO | Admitting: Physical Therapy

## 2021-05-24 ENCOUNTER — Telehealth (INDEPENDENT_AMBULATORY_CARE_PROVIDER_SITE_OTHER): Payer: BC Managed Care – PPO | Admitting: Family Medicine

## 2021-05-24 ENCOUNTER — Encounter: Payer: Self-pay | Admitting: Family Medicine

## 2021-05-24 ENCOUNTER — Other Ambulatory Visit: Payer: Self-pay

## 2021-05-24 DIAGNOSIS — Z20822 Contact with and (suspected) exposure to covid-19: Secondary | ICD-10-CM | POA: Diagnosis not present

## 2021-05-24 MED ORDER — BENZONATATE 200 MG PO CAPS: 200.0000 mg | ORAL_CAPSULE | Freq: Two times a day (BID) | ORAL | 0 refills | Status: DC | PRN

## 2021-05-24 MED ORDER — AZITHROMYCIN 250 MG PO TABS: ORAL_TABLET | ORAL | 0 refills | Status: DC

## 2021-05-24 NOTE — Progress Notes (Signed)
I connected with  Mikayla [name] on 05/24/21 by a video enabled telemedicine application and verified that I am speaking with the correct person using two identifiers.   I discussed the limitations of evaluation and management by telemedicine. The patient expressed understanding and agreed to proceed.

## 2021-05-24 NOTE — Progress Notes (Signed)
   Virtual Visit via Video   I connected with patient on 05/24/21 at  2:30 PM EDT by a video enabled telemedicine application and verified that I am speaking with the correct person using two identifiers.  Location patient: Home Location provider: [place], Office Persons participating in the virtual visit: Patient, Provider, CMA (Sabrina M)  I discussed the limitations of evaluation and management by telemedicine and the availability of in person appointments. The patient expressed understanding and agreed to proceed.  Subjective:   HPI:   Sore throat- pt reports after returning from out of town she developed congestion, low grade fever, and very sore throat.  Taking Nyquil and Dayquil w/ some relief.  +HA, 'my head feels heavy'.  Sxs started ~4 days ago.  2 home COVID tests were negative- Sunday and Monday.  Pt reports PND, thick yellow drainage.  No tooth pain.  + body aches.  + COVID contacts.  ROS:   See pertinent positives and negatives per HPI.  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 Inject 0.25mg   once a week for four weeks then increase to 0.5mg  weekly., Disp: 1.5 mL, Rfl: 0   furosemide (LASIX) 20 MG tablet, Take 1 tablet (20 mg total) by mouth daily. (Patient not taking:  Reported on 05/24/2021), Disp: 90 tablet, Rfl: 0   gabapentin (NEURONTIN) 300 MG capsule, Take 1 capsule (300 mg total) by mouth 3 (three) times daily as needed. (Patient not taking: Reported on 05/24/2021), Disp: 90 capsule, Rfl: 3  Allergies  Allergen Reactions   Codeine Itching    Objective:   There were no vitals taken for this visit. AAOx3, NAD NCAT, EOMI No obvious CN deficits Pt is able to speak clearly, coherently without shortness of breath or increased work of breathing.  + dry hacking cough Thought process is linear.  Mood is appropriate.   Assessment and Plan:   Suspected COVID- new.  Pt's sxs and known COVID contacts are very concerning for current COVID infxn.  Discussed that negative home tests are not always accurate or reliable- particularly if taken too early in the course of the illness.  Encouraged her to retest and to quarantine as if this is COVID.  Will enroll in home monitoring.  Encouraged drinking plenty of fluids, getting lots of rest, and if not already, to start taking Vit D, Vit C, and Zinc to boost the immune system.  Otherwise we want to treat the symptoms as they come- tylenol/ibuprofen for body aches/headache/fever, decongestants, cough medications (Tessalon sent).  Given the head pressure and thicker congestion will start Zpack in cause of bacterial sinusitis.  Pt expressed understanding and is in agreement w/ plan.    Neena Rhymes, MD 05/24/2021

## 2021-05-25 ENCOUNTER — Ambulatory Visit (HOSPITAL_BASED_OUTPATIENT_CLINIC_OR_DEPARTMENT_OTHER): Payer: BC Managed Care – PPO | Admitting: Physical Therapy

## 2021-05-30 ENCOUNTER — Ambulatory Visit (HOSPITAL_BASED_OUTPATIENT_CLINIC_OR_DEPARTMENT_OTHER): Payer: BC Managed Care – PPO | Admitting: Physical Therapy

## 2021-06-01 ENCOUNTER — Ambulatory Visit (HOSPITAL_BASED_OUTPATIENT_CLINIC_OR_DEPARTMENT_OTHER): Payer: BC Managed Care – PPO | Admitting: Physical Therapy

## 2021-06-14 ENCOUNTER — Other Ambulatory Visit: Payer: Self-pay | Admitting: Family Medicine

## 2021-06-14 ENCOUNTER — Other Ambulatory Visit: Payer: Self-pay

## 2021-06-14 DIAGNOSIS — E8881 Metabolic syndrome: Secondary | ICD-10-CM

## 2021-07-13 ENCOUNTER — Telehealth (INDEPENDENT_AMBULATORY_CARE_PROVIDER_SITE_OTHER): Payer: BC Managed Care – PPO | Admitting: Family Medicine

## 2021-07-13 ENCOUNTER — Encounter: Payer: Self-pay | Admitting: Family Medicine

## 2021-07-13 DIAGNOSIS — J069 Acute upper respiratory infection, unspecified: Secondary | ICD-10-CM

## 2021-07-13 DIAGNOSIS — Z20822 Contact with and (suspected) exposure to covid-19: Secondary | ICD-10-CM | POA: Diagnosis not present

## 2021-07-13 NOTE — Progress Notes (Addendum)
Virtual Visit via Telephone Note Unable to connect via video as received an error null message on video screen.  I connected with Mikayla [name] on 07/13/21 at  1:30 PM EDT by telephone and verified that I am speaking with the correct person using two identifiers.   I discussed the limitations, risks, security and privacy concerns of performing an evaluation and management service by telephone and the availability of in person appointments. I also discussed with the patient that there may be a patient responsible charge related to this service. The patient expressed understanding and agreed to proceed.  Location patient: home Location provider: work or home office Participants present for the call: patient, provider Patient did not have a visit in the prior 7 days to address this/these issue(s).  Chief Complaint  Patient presents with   Covid Exposure    Mikayla [name] was positive on 8/14, she tested and it was negative. Has sore throat and low grade fever. States she tested at home yesterday and it was negative    History of Present Illness: Pt started feeling achy, sore throat, fever 101 F starting yesterday.  Pt with a "little heaviness in chest and head, itchy ears.  Pt's home COVID test was negative.    Denies HA, n/v.  Tried Dayquil, warm fluids, advil.  Pt's son 25.5 yo dx'd COVID on 8/14.   Observations/Objective: Patient sounds cheerful and well on the phone. I do not appreciate any SOB. Speech and thought processing are grossly intact. Patient reported vitals:  Assessment and Plan: Viral URI with cough  Close exposure to COVID-19 virus -Symptoms starting 8/24.  Negative at home COVID test. -Continue supportive care including rest, hydration, gargling with warm salt water Chloraseptic spray, Tylenol -Encouraged to use Flonase -Consider PCR COVID testing as pt son recently Dx with COVID on 8/14. -Given strict precautions  Follow Up Instructions: prn  [id number] 5-10 [id number]  11-20 [id number] 21-30 I did not refer this patient for an OV in the next 24 hours for this/these issue(s).  I discussed the assessment and treatment plan with the patient. The patient was provided an opportunity to ask questions and all were answered. The patient agreed with the plan and demonstrated an understanding of the instructions.   The patient was advised to call back or seek an in-person evaluation if the symptoms worsen or if the condition fails to improve as anticipated.  I provided 6:43 minutes of non-face-to-face time during this encounter.   Deeann Saint, MD

## 2021-08-18 DIAGNOSIS — H25011 Cortical age-related cataract, right eye: Secondary | ICD-10-CM | POA: Diagnosis not present

## 2021-08-18 DIAGNOSIS — H5203 Hypermetropia, bilateral: Secondary | ICD-10-CM | POA: Diagnosis not present

## 2021-10-03 ENCOUNTER — Encounter: Payer: Self-pay | Admitting: Registered Nurse

## 2021-10-03 ENCOUNTER — Telehealth (INDEPENDENT_AMBULATORY_CARE_PROVIDER_SITE_OTHER): Payer: BC Managed Care – PPO | Admitting: Registered Nurse

## 2021-10-03 ENCOUNTER — Other Ambulatory Visit: Payer: Self-pay

## 2021-10-03 VITALS — Temp 102.0°F

## 2021-10-03 DIAGNOSIS — B9689 Other specified bacterial agents as the cause of diseases classified elsewhere: Secondary | ICD-10-CM

## 2021-10-03 DIAGNOSIS — J069 Acute upper respiratory infection, unspecified: Secondary | ICD-10-CM | POA: Diagnosis not present

## 2021-10-03 MED ORDER — DM-GUAIFENESIN ER 30-600 MG PO TB12: 1.0000 | ORAL_TABLET | Freq: Two times a day (BID) | ORAL | 0 refills | Status: DC

## 2021-10-03 MED ORDER — BENZONATATE 200 MG PO CAPS: 200.0000 mg | ORAL_CAPSULE | Freq: Two times a day (BID) | ORAL | 0 refills | Status: DC | PRN

## 2021-10-03 MED ORDER — AMOXICILLIN-POT CLAVULANATE 875-125 MG PO TABS: 1.0000 | ORAL_TABLET | Freq: Two times a day (BID) | ORAL | 0 refills | Status: DC
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
## 2021-10-03 NOTE — Patient Instructions (Signed)
° ° ° °  If you have lab work done today you will be contacted with your lab results within the next 2 weeks.  If you have not heard from us then please contact us. The fastest way to get your results is to register for My Chart. ° ° °IF you received an x-ray today, you will receive an invoice from [facility]. Please contact [facility] at [phone] with questions or concerns regarding your invoice.  ° °IF you received labwork today, you will receive an invoice from [facility]. Please contact [facility] at [phone] with questions or concerns regarding your invoice.  ° °Our billing staff will not be able to assist you with questions regarding bills from these companies. ° °You will be contacted with the lab results as soon as they are available. The fastest way to get your results is to activate your My Chart account. Instructions are located on the last page of this paperwork. If you have not heard from us regarding the results in 2 weeks, please contact this office. °  ° ° ° °

## 2021-10-19 ENCOUNTER — Telehealth: Payer: BC Managed Care – PPO | Admitting: Family Medicine

## 2021-10-19 ENCOUNTER — Telehealth: Payer: BC Managed Care – PPO | Admitting: Physician Assistant

## 2021-10-19 DIAGNOSIS — R079 Chest pain, unspecified: Secondary | ICD-10-CM

## 2021-10-19 DIAGNOSIS — B9689 Other specified bacterial agents as the cause of diseases classified elsewhere: Secondary | ICD-10-CM

## 2021-10-19 DIAGNOSIS — R6889 Other general symptoms and signs: Secondary | ICD-10-CM

## 2021-10-19 DIAGNOSIS — J208 Acute bronchitis due to other specified organisms: Secondary | ICD-10-CM

## 2021-10-19 MED ORDER — PREDNISONE 20 MG PO TABS: 40.0000 mg | ORAL_TABLET | Freq: Every day | ORAL | 0 refills | Status: DC

## 2021-10-19 MED ORDER — LEVOFLOXACIN 500 MG PO TABS: 500.0000 mg | ORAL_TABLET | Freq: Every day | ORAL | 0 refills | Status: AC

## 2021-10-19 MED ORDER — BENZONATATE 100 MG PO CAPS: 100.0000 mg | ORAL_CAPSULE | Freq: Three times a day (TID) | ORAL | 0 refills | Status: DC | PRN

## 2021-10-19 NOTE — Patient Instructions (Signed)
[name], thank you for joining [name], PA-C for today's virtual visit.  While this provider is not your primary care provider (PCP), if your PCP is located in our provider database this encounter information will be shared with them immediately following your visit.  Consent: (Patient) Mikayla [name] provided verbal consent for this virtual visit at the beginning of the encounter.  Current Medications:  Current Outpatient Medications:    benzonatate (TESSALON) 100 MG capsule, Take 1 capsule (100 mg total) by mouth 3 (three) times daily as needed., Disp: 30 capsule, Rfl: 0   levofloxacin (LEVAQUIN) 500 MG tablet, Take 1 tablet (500 mg total) by mouth daily for 7 days., Disp: 7 tablet, Rfl: 0   predniSONE (DELTASONE) 20 MG tablet, Take 2 tablets (40 mg total) by mouth daily with breakfast., Disp: 10 tablet, Rfl: 0   Medications ordered in this encounter:  Meds ordered this encounter  Medications   levofloxacin (LEVAQUIN) 500 MG tablet    Sig: Take 1 tablet (500 mg total) by mouth daily for 7 days.    Dispense:  7 tablet    Refill:  0    Order Specific Question:   Supervising Provider    Answer:   Hyacinth Meeker, BRIAN [[id number]]   predniSONE (DELTASONE) 20 MG tablet    Sig: Take 2 tablets (40 mg total) by mouth daily with breakfast.    Dispense:  10 tablet    Refill:  0    Order Specific Question:   Supervising Provider    Answer:   MILLER, BRIAN [[id number]]   benzonatate (TESSALON) 100 MG capsule    Sig: Take 1 capsule (100 mg total) by mouth 3 (three) times daily as needed.    Dispense:  30 capsule    Refill:  0    Order Specific Question:   Supervising Provider    Answer:   Hyacinth Meeker, BRIAN [[id number]]     *If you need refills on other medications prior to your next appointment, please contact your pharmacy*  Follow-Up: Call back or seek an in-person evaluation if the symptoms worsen or if the condition fails to improve as anticipated.  Other Instructions Acute  Bronchitis, Adult Acute bronchitis is sudden inflammation of the main airways (bronchi) that come off the windpipe (trachea) in the lungs. The swelling causes the airways to get smaller and make more mucus than normal. This can make it hard to breathe and can cause coughing or noisy breathing (wheezing). Acute bronchitis may last several weeks. The cough may last longer. Allergies, asthma, and exposure to smoke may make the condition worse. What are the causes? This condition can be caused by germs and by substances that irritate the lungs, including: Cold and flu viruses. The most common cause of this condition is the virus that causes the common cold. Bacteria. This is less common. Breathing in substances that irritate the lungs, including: Smoke from cigarettes and other forms of tobacco. Dust and pollen. Fumes from household cleaning products, gases, or burned fuel. Indoor or outdoor air pollution. What increases the risk? The following factors may make you more likely to develop this condition: A weak body's defense system, also called the immune system. A condition that affects your lungs and breathing, such as asthma. What are the signs or symptoms? Common symptoms of this condition include: Coughing. This may bring up clear, yellow, or green mucus from your lungs (sputum). Wheezing. Runny or stuffy nose. Having too much mucus in your lungs (chest congestion). Shortness  of breath. Aches and pains, including sore throat or chest. How is this diagnosed? This condition is usually diagnosed based on: Your symptoms and medical history. A physical exam. You may also have other tests, including tests to rule out other conditions, such as pneumonia. These tests include: A test of lung function. Test of a mucus sample to look for the presence of bacteria. Tests to check the oxygen level in your blood. Blood tests. Chest X-ray. How is this treated? Most cases of acute bronchitis clear  up over time without treatment. Your health care provider may recommend: Drinking more fluids to help thin your mucus so it is easier to cough up. Taking inhaled medicine (inhaler) to improve air flow in and out of your lungs. Using a vaporizer or a humidifier. These are machines that add water to the air to help you breathe better. Taking a medicine that thins mucus and clears congestion (expectorant). Taking a medicine that prevents or stops coughing (cough suppressant). It is notcommon to take an antibiotic medicine for this condition. Follow these instructions at home:  Take over-the-counter and prescription medicines only as told by your health care provider. Use an inhaler, vaporizer, or humidifier as told by your health care provider. Take two teaspoons (10 mL) of honey at bedtime to lessen coughing at night. Drink enough fluid to keep your urine pale yellow. Do not use any products that contain nicotine or tobacco. These products include cigarettes, chewing tobacco, and vaping devices, such as e-cigarettes. If you need help quitting, ask your health care provider. Get plenty of rest. Return to your normal activities as told by your health care provider. Ask your health care provider what activities are safe for you. Keep all follow-up visits. This is important. How is this prevented? To lower your risk of getting this condition again: Wash your hands often with soap and water for at least 20 seconds. If soap and water are not available, use hand sanitizer. Avoid contact with people who have cold symptoms. Try not to touch your mouth, nose, or eyes with your hands. Avoid breathing in smoke or chemical fumes. Breathing smoke or chemical fumes will make your condition worse. Get the flu shot every year. Contact a health care provider if: Your symptoms do not improve after 2 weeks. You have trouble coughing up the mucus. Your cough keeps you awake at night. You have a fever. Get help  right away if you: Cough up blood. Feel pain in your chest. Have severe shortness of breath. Faint or keep feeling like you are going to faint. Have a severe headache. Have a fever or chills that get worse. These symptoms may represent a serious problem that is an emergency. Do not wait to see if the symptoms will go away. Get medical help right away. Call your local emergency services (911 in the [place] Do not drive yourself to the hospital. Summary Acute bronchitis is inflammation of the main airways (bronchi) that come off the windpipe (trachea) in the lungs. The swelling causes the airways to get smaller and make more mucus than normal. Drinking more fluids can help thin your mucus so it is easier to cough up. Take over-the-counter and prescription medicines only as told by your health care provider. Do not use any products that contain nicotine or tobacco. These products include cigarettes, chewing tobacco, and vaping devices, such as e-cigarettes. If you need help quitting, ask your health care provider. Contact a health care provider if your symptoms do not  improve after 2 weeks. This information is not intended to replace advice given to you by your health care provider. Make sure you discuss any questions you have with your health care provider. Document Revised: 03/08/2021 Document Reviewed: 03/08/2021 Elsevier Patient Education  2022 [employer].    If you have been instructed to have an in-person evaluation today at a local Urgent Care facility, please use the link below. It will take you to a list of all of our available [facility], including address, phone number and hours of operation. Please do not delay care.  [facility]  If you or a family member do not have a primary care provider, use the link below to schedule a visit and establish care. When you choose a [facility] primary care physician or advanced practice provider, you gain a long-term  partner in health. Find a Primary Care Provider  Learn more about [facility] in-office and virtual care options: [facility] - Get Care Now

## 2021-10-19 NOTE — Progress Notes (Signed)
Virtual Visit Consent   Mikayla [name], you are scheduled for a virtual visit with a [facility] provider today.     Just as with appointments in the office, your consent must be obtained to participate.  Your consent will be active for this visit and any virtual visit you may have with one of our providers in the next 365 days.     If you have a MyChart account, a copy of this consent can be sent to you electronically.  All virtual visits are billed to your insurance company just like a traditional visit in the office.    As this is a virtual visit, video technology does not allow for your provider to perform a traditional examination.  This may limit your provider's ability to fully assess your condition.  If your provider identifies any concerns that need to be evaluated in person or the need to arrange testing (such as labs, EKG, etc.), we will make arrangements to do so.     Although advances in technology are sophisticated, we cannot ensure that it will always work on either your end or our end.  If the connection with a video visit is poor, the visit may have to be switched to a telephone visit.  With either a video or telephone visit, we are not always able to ensure that we have a secure connection.     I need to obtain your verbal consent now.   Are you willing to proceed with your visit today?    Mikayla [name] has provided verbal consent on 10/19/2021 for a virtual visit (video or telephone).   [name], PA-C   Date: 10/19/2021 3:04 PM   Virtual Visit via Video Note   I, [name], connected with  Mikayla [name]  ([id number], 1966/06/12) on 10/19/21 at  3:00 PM EST by a video-enabled telemedicine application and verified that I am speaking with the correct person using two identifiers.  Location: Patient: Virtual Visit Location Patient: Home Provider: Virtual Visit Location Provider: Home Office   I discussed the limitations of evaluation and  management by telemedicine and the availability of in person appointments. The patient expressed understanding and agreed to proceed.    History of Present Illness: Mikayla [name] is a 55 y.o. who identifies as a female who was assigned female at birth, and is being seen today for URI symptoms.  HPI: URI  This is a new problem. The current episode started more than 1 month ago. The problem has been gradually worsening. Maximum temperature: Subjective fever and chills started yesterday. The fever has been present for Less than 1 day. Associated symptoms include congestion, coughing, headaches, sinus pain and a sore throat (dry and achy). Pertinent negatives include no diarrhea, ear pain, nausea, plugged ear sensation, rhinorrhea, vomiting or wheezing. Associated symptoms comments: Myalgias . She has tried increased fluids (augmentin, tessalon, Mucinex dm, Nyquil, Dayquil) for the symptoms. The treatment provided no relief.   Seen by PCP on 10/03/21 and treated with Augmentin, Tessalon and Mucinex DM.   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   Dyshidrotic eczema 09/05/2012   Vitamin D deficiency 08/13/2012   Morbid obesity (HCC) 04/11/2010  GERD 02/10/2009    Allergies:  Allergies  Allergen Reactions   Codeine Itching   Medications:  Current Outpatient Medications:    benzonatate (TESSALON) 100 MG capsule, Take 1 capsule (100 mg total) by mouth 3 (three) times daily as needed., Disp: 30 capsule, Rfl: 0   levofloxacin (LEVAQUIN) 500 MG tablet, Take 1 tablet (500 mg total) by mouth daily for 7 days., Disp: 7 tablet, Rfl: 0   predniSONE (DELTASONE) 20 MG tablet, Take 2 tablets (40 mg total) by  mouth daily with breakfast., Disp: 10 tablet, Rfl: 0  Observations/Objective: Patient is well-developed, well-nourished in no acute distress.  Resting comfortably at home.  Head is normocephalic, atraumatic.  No labored breathing.  Speech is clear and coherent with logical content.  Patient is alert and oriented at baseline.  Very congested sounding  Assessment and Plan: 1. Acute bacterial bronchitis - levofloxacin (LEVAQUIN) 500 MG tablet; Take 1 tablet (500 mg total) by mouth daily for 7 days.  Dispense: 7 tablet; Refill: 0 - predniSONE (DELTASONE) 20 MG tablet; Take 2 tablets (40 mg total) by mouth daily with breakfast.  Dispense: 10 tablet; Refill: 0 - benzonatate (TESSALON) 100 MG capsule; Take 1 capsule (100 mg total) by mouth 3 (three) times daily as needed.  Dispense: 30 capsule; Refill: 0  - Worsening symptoms over 5 weeks despite augmentin treatment 2 weeks ago - Will treat with Levaquin, prednisone and tessalon - Push fluids - Rest - Seek in person evaluation if not improving or worsening  Follow Up Instructions: I discussed the assessment and treatment plan with the patient. The patient was provided an opportunity to ask questions and all were answered. The patient agreed with the plan and demonstrated an understanding of the instructions.  A copy of instructions were sent to the patient via MyChart unless otherwise noted below.    The patient was advised to call back or seek an in-person evaluation if the symptoms worsen or if the condition fails to improve as anticipated.  Time:  I spent 10 minutes with the patient via telehealth technology discussing the above problems/concerns.    [name], PA-C

## 2021-10-19 NOTE — Progress Notes (Signed)
[place]   Reports constant chest pain- she was recommended to be seen in person

## 2021-10-27 ENCOUNTER — Encounter (HOSPITAL_BASED_OUTPATIENT_CLINIC_OR_DEPARTMENT_OTHER): Payer: Self-pay | Admitting: Physical Therapy

## 2021-12-25 ENCOUNTER — Ambulatory Visit (INDEPENDENT_AMBULATORY_CARE_PROVIDER_SITE_OTHER): Payer: BC Managed Care – PPO | Admitting: Family Medicine

## 2021-12-25 ENCOUNTER — Encounter: Payer: Self-pay | Admitting: Family Medicine

## 2021-12-25 VITALS — BP 112/70 | HR 78 | Temp 98.0°F | Resp 16 | Ht 62.0 in | Wt 279.4 lb

## 2021-12-25 DIAGNOSIS — Z23 Encounter for immunization: Secondary | ICD-10-CM

## 2021-12-25 DIAGNOSIS — E559 Vitamin D deficiency, unspecified: Secondary | ICD-10-CM | POA: Diagnosis not present

## 2021-12-25 DIAGNOSIS — Z0000 Encounter for general adult medical examination without abnormal findings: Secondary | ICD-10-CM

## 2021-12-25 DIAGNOSIS — Z1231 Encounter for screening mammogram for malignant neoplasm of breast: Secondary | ICD-10-CM

## 2021-12-25 LAB — HEMOGLOBIN A1C: Hgb A1c MFr Bld: 5.9 % (ref 4.6–6.5)

## 2021-12-25 LAB — CBC WITH DIFFERENTIAL/PLATELET
Basophils Absolute: 0 10*3/uL (ref 0.0–0.1)
Basophils Relative: 0.7 % (ref 0.0–3.0)
Eosinophils Absolute: 0.3 10*3/uL (ref 0.0–0.7)
Eosinophils Relative: 3.5 % (ref 0.0–5.0)
HCT: 37.1 % (ref 36.0–46.0)
Hemoglobin: 12 g/dL (ref 12.0–15.0)
Lymphocytes Relative: 18.3 % (ref 12.0–46.0)
Lymphs Abs: 1.3 10*3/uL (ref 0.7–4.0)
MCHC: 32.5 g/dL (ref 30.0–36.0)
MCV: 91.4 fl (ref 78.0–100.0)
Monocytes Absolute: 0.6 10*3/uL (ref 0.1–1.0)
Monocytes Relative: 8.4 % (ref 3.0–12.0)
Neutro Abs: 4.9 10*3/uL (ref 1.4–7.7)
Neutrophils Relative %: 69.1 % (ref 43.0–77.0)
Platelets: 197 10*3/uL (ref 150.0–400.0)
RBC: 4.06 Mil/uL (ref 3.87–5.11)
RDW: 14.1 % (ref 11.5–15.5)
WBC: 7.1 10*3/uL (ref 4.0–10.5)

## 2021-12-25 LAB — HEPATIC FUNCTION PANEL
ALT: 13 U/L (ref 0–35)
AST: 14 U/L (ref 0–37)
Albumin: 3.8 g/dL (ref 3.5–5.2)
Alkaline Phosphatase: 78 U/L (ref 39–117)
Bilirubin, Direct: 0.1 mg/dL (ref 0.0–0.3)
Total Bilirubin: 0.6 mg/dL (ref 0.2–1.2)
Total Protein: 6.7 g/dL (ref 6.0–8.3)

## 2021-12-25 LAB — VITAMIN D 25 HYDROXY (VIT D DEFICIENCY, FRACTURES): VITD: 15.57 ng/mL — ABNORMAL LOW (ref 30.00–100.00)

## 2021-12-25 LAB — LIPID PANEL
Cholesterol: 209 mg/dL — ABNORMAL HIGH (ref 0–200)
HDL: 50 mg/dL (ref >39.00)
LDL Cholesterol: 139 mg/dL — ABNORMAL HIGH (ref 0–99)
NonHDL: 158.55
Total CHOL/HDL Ratio: 4
Triglycerides: 99 mg/dL (ref 0.0–149.0)
VLDL: 19.8 mg/dL (ref 0.0–40.0)

## 2021-12-25 LAB — BASIC METABOLIC PANEL
BUN: 14 mg/dL (ref 6–23)
CO2: 30 mEq/L (ref 19–32)
Calcium: 9 mg/dL (ref 8.4–10.5)
Chloride: 103 mEq/L (ref 96–112)
Creatinine, Ser: 0.5 mg/dL (ref 0.40–1.20)
GFR: 104.01 mL/min (ref >60.00)
Glucose, Bld: 85 mg/dL (ref 70–99)
Potassium: 4.1 mEq/L (ref 3.5–5.1)
Sodium: 139 mEq/L (ref 135–145)

## 2021-12-25 LAB — TSH: TSH: 3.13 u[IU]/mL (ref 0.35–5.50)

## 2021-12-25 NOTE — Progress Notes (Signed)
° °  Subjective:    Patient ID: Mikayla [name], female    DOB: Feb 08, 1966, 56 y.o.   MRN: [id number]  HPI CPE- UTD on colonoscopy.  Due for Tdap, mammo.  No need for pap due to hysterectomy  Patient Care Team    Relationship Specialty Notifications Start End  Midge Minium, MD PCP - General Family Medicine  03/03/21   Jarome Matin, MD Consulting Physician Dermatology  11/02/16   Juanita Craver, MD Consulting Physician Gastroenterology  11/02/16   Specialists, [place] Physician Orthopedic Surgery  12/15/18    Comment: Dr. Berle Mull    Health Maintenance  Topic Date Due   Zoster Vaccines- Shingrix (1 of 2) Never done   TETANUS/TDAP  05/02/2020   MAMMOGRAM  10/28/2020   COVID-19 Vaccine (4 - Booster for Pfizer series) 01/10/2021   INFLUENZA VACCINE  02/16/2022 (Originally 06/19/2021)   COLONOSCOPY (Pts 45-32yrs Insurance coverage will need to be confirmed)  11/03/2024   Hepatitis C Screening  Completed   HIV Screening  Completed   HPV VACCINES  Aged Out      Review of Systems Patient reports no vision/ hearing changes, adenopathy,fever, weight change,  persistant/recurrent hoarseness , swallowing issues, chest pain, palpitations, edema, persistant/recurrent cough, hemoptysis, dyspnea (rest/exertional/paroxysmal nocturnal), gastrointestinal bleeding (melena, rectal bleeding), abdominal pain, significant heartburn, bowel changes, GU symptoms (dysuria, hematuria, incontinence), Gyn symptoms (abnormal  bleeding, pain),  syncope, focal weakness, memory loss, numbness & tingling, skin/hair/nail changes, abnormal bruising or bleeding, anxiety, or depression.   This visit occurred during the SARS-CoV-2 public health emergency.  Safety protocols were in place, including screening questions prior to the visit, additional usage of staff PPE, and extensive cleaning of exam room while observing appropriate contact time as indicated for disinfecting solutions.       Objective:   Physical Exam General Appearance:    Alert, cooperative, no distress, appears stated age, obese  Head:    Normocephalic, without obvious abnormality, atraumatic  Eyes:    PERRL, conjunctiva/corneas clear, EOM's intact, fundi    benign, both eyes  Ears:    Normal TM's and external ear canals, both ears  Nose:   Deferred due to COVID  Throat:   Neck:   Supple, symmetrical, trachea midline, no adenopathy;    Thyroid: no enlargement/tenderness/nodules  Back:     Symmetric, no curvature, ROM normal, no CVA tenderness  Lungs:     Clear to auscultation bilaterally, respirations unlabored  Chest Wall:    No tenderness or deformity   Heart:    Regular rate and rhythm, S1 and S2 normal, no murmur, rub   or gallop  Breast Exam:    Deferred to mammo  Abdomen:     Soft, non-tender, bowel sounds active all four quadrants,    no masses, no organomegaly  Genitalia:    Deferred to GYN  Rectal:    Extremities:   Extremities normal, atraumatic, no cyanosis or edema  Pulses:   2+ and symmetric all extremities  Skin:   Skin color, texture, turgor normal, no rashes or lesions  Lymph nodes:   Cervical, supraclavicular, and axillary nodes normal  Neurologic:   CNII-XII intact, normal strength, sensation and reflexes    throughout          Assessment & Plan:

## 2021-12-25 NOTE — Assessment & Plan Note (Signed)
Pt's PE WNL w/ exception of obesity.  UTD on colonoscopy.  Tdap given.  Mammo ordered.  Check labs.  Anticipatory guidance provided.

## 2021-12-25 NOTE — Assessment & Plan Note (Signed)
BMI 51.1  Pt reports hip and knee pain due to excess weight.  Encouraged low carb diet and regular physical activity- such as walking.  Check labs to risk stratify.  Will follow.

## 2021-12-25 NOTE — Patient Instructions (Signed)
Follow up in 6 months to recheck weight loss progress We'll notify you of your lab results make any changes if needed We'll call you to schedule your mammogram Continue to work on healthy diet and regular exercise- you can do it!! Call with any questions or concerns Stay Safe!  Stay Healthy! Happy Spring!!!

## 2021-12-25 NOTE — Assessment & Plan Note (Signed)
Check labs and replete prn. 

## 2021-12-29 ENCOUNTER — Telehealth: Payer: BC Managed Care – PPO | Admitting: Family Medicine

## 2021-12-29 DIAGNOSIS — J019 Acute sinusitis, unspecified: Secondary | ICD-10-CM

## 2021-12-29 DIAGNOSIS — R051 Acute cough: Secondary | ICD-10-CM

## 2021-12-29 DIAGNOSIS — B9689 Other specified bacterial agents as the cause of diseases classified elsewhere: Secondary | ICD-10-CM

## 2021-12-29 MED ORDER — BENZONATATE 100 MG PO CAPS: 100.0000 mg | ORAL_CAPSULE | Freq: Two times a day (BID) | ORAL | 0 refills | Status: DC | PRN

## 2021-12-29 MED ORDER — AMOXICILLIN-POT CLAVULANATE 875-125 MG PO TABS: 1.0000 | ORAL_TABLET | Freq: Two times a day (BID) | ORAL | 0 refills | Status: AC

## 2021-12-29 NOTE — Progress Notes (Signed)
Virtual Visit Consent   Mikayla [name], you are scheduled for a virtual visit with a [facility] provider today.     Just as with appointments in the office, your consent must be obtained to participate.  Your consent will be active for this visit and any virtual visit you may have with one of our providers in the next 365 days.     If you have a MyChart account, a copy of this consent can be sent to you electronically.  All virtual visits are billed to your insurance company just like a traditional visit in the office.    As this is a virtual visit, video technology does not allow for your provider to perform a traditional examination.  This may limit your provider's ability to fully assess your condition.  If your provider identifies any concerns that need to be evaluated in person or the need to arrange testing (such as labs, EKG, etc.), we will make arrangements to do so.     Although advances in technology are sophisticated, we cannot ensure that it will always work on either your end or our end.  If the connection with a video visit is poor, the visit may have to be switched to a telephone visit.  With either a video or telephone visit, we are not always able to ensure that we have a secure connection.     I need to obtain your verbal consent now.   Are you willing to proceed with your visit today?    Mikayla [name] has provided verbal consent on 12/29/2021 for a virtual visit (video or telephone).   Freddy Finner, NP   Date: 12/29/2021 9:32 AM   Virtual Visit via Video Note   I, Freddy Finner, connected with  Mikayla [name]  ([id number], 04-01-1966) on 12/29/21 at  9:30 AM EST by a video-enabled telemedicine application and verified that I am speaking with the correct person using two identifiers.  Location: Patient: Virtual Visit Location Patient: Home Provider: Virtual Visit Location Provider: Home Office   I discussed the limitations of evaluation and management by  telemedicine and the availability of in person appointments. The patient expressed understanding and agreed to proceed.    History of Present Illness: Mikayla [name] is a 56 y.o. who identifies as a female who was assigned female at birth, and is being seen today for just came [place], tested several times for COVID on HT but negative every time. Increase in drainage- yellow, thick.  Marland Kitchen  HPI: Sinus Problem This is a new problem. The current episode started 1 to 4 weeks ago. The problem has been gradually worsening since onset. The maximum temperature recorded prior to her arrival was 101 - 101.9 F. The fever has been present for 1 to 2 days. Associated symptoms include chills, congestion, coughing, ear pain, a hoarse voice, sinus pressure, a sore throat and swollen glands. Pertinent negatives include no headaches, neck pain, shortness of breath or sneezing. Past treatments include oral decongestants and lying down (nyquil and dayquil). The treatment provided no relief.   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  Observations/Objective: Patient is well-developed, well-nourished in no acute distress.  Resting comfortably  at home.  Head is normocephalic, atraumatic.  No labored breathing.  Speech is clear and coherent with logical content.  Patient is alert and oriented at baseline.    Assessment and Plan:  1.  Acute bacterial sinusitis  - amoxicillin-clavulanate (AUGMENTIN) 875-125 MG tablet; Take 1 tablet by mouth 2 (two) times daily for 7 days.  Dispense: 14 tablet; Refill: 0 - benzonatate (TESSALON) 100 MG capsule; Take 1 capsule (100 mg total) by mouth 2 (two) times daily as needed for cough.  Dispense: 20 capsule; Refill: 0  2. Acute cough  - benzonatate (TESSALON) 100 MG capsule; Take 1 capsule (100 mg total) by mouth 2 (two) times daily as needed for cough.  Dispense: 20 capsule; Refill: 0  -URI turned sinus infection -cough  Provided with above treatment OTC reviewed    Reviewed side effects, risks and benefits of medication.    Patient acknowledged agreement and understanding of the plan.     Follow Up Instructions: I discussed the assessment and treatment plan with the patient. The patient was provided an opportunity to ask questions and all were answered. The patient agreed with the plan and demonstrated an understanding of the instructions.  A copy of instructions were sent to the patient via MyChart unless otherwise noted below.     The patient was advised to call back or seek an in-person evaluation if the symptoms worsen or if the condition fails to improve as anticipated.  Time:  I spent 10 minutes with the patient via telehealth technology discussing the above problems/concerns.    Freddy Finner, NP

## 2021-12-29 NOTE — Patient Instructions (Signed)

## 2022-01-01 ENCOUNTER — Other Ambulatory Visit: Payer: Self-pay

## 2022-01-01 ENCOUNTER — Encounter: Payer: Self-pay | Admitting: Family Medicine

## 2022-01-01 DIAGNOSIS — E559 Vitamin D deficiency, unspecified: Secondary | ICD-10-CM

## 2022-01-01 MED ORDER — VITAMIN D (ERGOCALCIFEROL) 1.25 MG (50000 UNIT) PO CAPS: 50000.0000 [IU] | ORAL_CAPSULE | ORAL | 0 refills | Status: DC

## 2022-01-04 ENCOUNTER — Other Ambulatory Visit: Payer: Self-pay

## 2022-01-04 ENCOUNTER — Ambulatory Visit: Payer: BC Managed Care – PPO | Admitting: Obstetrics and Gynecology

## 2022-01-04 ENCOUNTER — Encounter: Payer: Self-pay | Admitting: Obstetrics and Gynecology

## 2022-01-04 ENCOUNTER — Other Ambulatory Visit (HOSPITAL_COMMUNITY)
Admission: RE | Admit: 2022-01-04 | Discharge: 2022-01-04 | Disposition: A | Discharge status: Home / Self Care | Payer: BC Managed Care – PPO | Source: Ambulatory Visit | Attending: Obstetrics and Gynecology | Admitting: Obstetrics and Gynecology

## 2022-01-04 VITALS — BP 112/60 | HR 78 | Ht 61.5 in | Wt 279.0 lb

## 2022-01-04 DIAGNOSIS — N952 Postmenopausal atrophic vaginitis: Secondary | ICD-10-CM

## 2022-01-04 DIAGNOSIS — N760 Acute vaginitis: Secondary | ICD-10-CM

## 2022-01-04 MED ORDER — ESTRADIOL 0.1 MG/GM VA CREA: TOPICAL_CREAM | VAGINAL | 0 refills | Status: DC

## 2022-01-04 MED ORDER — CLOTRIMAZOLE-BETAMETHASONE 1-0.05 % EX CREA: 1.0000 "application " | TOPICAL_CREAM | Freq: Two times a day (BID) | CUTANEOUS | 0 refills | Status: DC
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
## 2022-01-05 LAB — CERVICOVAGINAL ANCILLARY ONLY
Bacterial Vaginitis (gardnerella): NEGATIVE
Candida Glabrata: NEGATIVE
Candida Vaginitis: NEGATIVE
Comment: NEGATIVE
Comment: NEGATIVE
Comment: NEGATIVE
Comment: NEGATIVE
Trichomonas: NEGATIVE

## 2022-01-17 ENCOUNTER — Ambulatory Visit
Admission: RE | Admit: 2022-01-17 | Discharge: 2022-01-17 | Disposition: A | Discharge status: Home / Self Care | Payer: BC Managed Care – PPO | Source: Ambulatory Visit | Attending: Family Medicine | Admitting: Family Medicine

## 2022-01-17 DIAGNOSIS — Z1231 Encounter for screening mammogram for malignant neoplasm of breast: Secondary | ICD-10-CM
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
## 2022-02-14 ENCOUNTER — Encounter: Payer: Self-pay | Admitting: Obstetrics and Gynecology

## 2022-02-14 ENCOUNTER — Ambulatory Visit (INDEPENDENT_AMBULATORY_CARE_PROVIDER_SITE_OTHER): Payer: BC Managed Care – PPO | Admitting: Obstetrics and Gynecology

## 2022-02-14 ENCOUNTER — Other Ambulatory Visit: Payer: Self-pay

## 2022-02-14 ENCOUNTER — Other Ambulatory Visit (HOSPITAL_COMMUNITY)
Admission: RE | Admit: 2022-02-14 | Discharge: 2022-02-14 | Disposition: A | Discharge status: Home / Self Care | Payer: BC Managed Care – PPO | Source: Ambulatory Visit | Attending: Obstetrics and Gynecology | Admitting: Obstetrics and Gynecology

## 2022-02-14 VITALS — BP 122/80 | HR 74 | Ht 61.5 in | Wt 282.0 lb

## 2022-02-14 DIAGNOSIS — B372 Candidiasis of skin and nail: Secondary | ICD-10-CM

## 2022-02-14 DIAGNOSIS — Z01419 Encounter for gynecological examination (general) (routine) without abnormal findings: Secondary | ICD-10-CM | POA: Diagnosis not present

## 2022-02-14 DIAGNOSIS — R6882 Decreased libido: Secondary | ICD-10-CM

## 2022-02-14 MED ORDER — ESTRADIOL 0.1 MG/GM VA CREA: TOPICAL_CREAM | VAGINAL | 2 refills | Status: DC

## 2022-02-14 MED ORDER — NYSTATIN 100000 UNIT/GM EX POWD: 1.0000 "application " | Freq: Three times a day (TID) | CUTANEOUS | 3 refills | Status: DC

## 2022-02-14 NOTE — Patient Instructions (Signed)
EXERCISE AND DIET:  We recommended that you start or continue a regular exercise program for good health. Regular exercise means any activity that makes your heart beat faster and makes you sweat.  We recommend exercising at least 30 minutes per day at least 3 days a week, preferably 4 or 5.  We also recommend a diet low in fat and sugar.  Inactivity, poor dietary choices and obesity can cause diabetes, heart attack, stroke, and kidney damage, among others.   ? ?ALCOHOL AND SMOKING:  Women should limit their alcohol intake to no more than 7 drinks/beers/glasses of wine (combined, not each!) per week. Moderation of alcohol intake to this level decreases your risk of breast cancer and liver damage. And of course, no recreational drugs are part of a healthy lifestyle.  And absolutely no smoking or even second hand smoke. Most people know smoking can cause heart and lung diseases, but did you know it also contributes to weakening of your bones? Aging of your skin?  Yellowing of your teeth and nails? ? ?CALCIUM AND VITAMIN D:  Adequate intake of calcium and Vitamin D are recommended.  The recommendations for exact amounts of these supplements seem to change often, but generally speaking 600 mg of calcium (either carbonate or citrate) and 800 units of Vitamin D per day seems prudent. Certain women may benefit from higher intake of Vitamin D.  If you are among these women, your doctor will have told you during your visit.   ? ?PAP SMEARS:  Pap smears, to check for cervical cancer or precancers,  have traditionally been done yearly, although recent scientific advances have shown that most women can have pap smears less often.  However, every woman still should have a physical exam from her gynecologist every year. It will include a breast check, inspection of the vulva and vagina to check for abnormal growths or skin changes, a visual exam of the cervix, and then an exam to evaluate the size and shape of the uterus and  ovaries.  And after 58 years of age, a rectal exam is indicated to check for rectal cancers. We will also provide age appropriate advice regarding health maintenance, like when you should have certain vaccines, screening for sexually transmitted diseases, bone density testing, colonoscopy, mammograms, etc.  ? ?MAMMOGRAMS:  All women over 40 years old should have a yearly mammogram. Many facilities now offer a "3D" mammogram, which may cost around $50 extra out of pocket. If possible,  we recommend you accept the option to have the 3D mammogram performed.  It both reduces the number of women who will be called back for extra views which then turn out to be normal, and it is better than the routine mammogram at detecting truly abnormal areas.   ? ?COLONOSCOPY:  Colonoscopy to screen for colon cancer is recommended for all women at age 50.  We know, you hate the idea of the prep.  We agree, BUT, having colon cancer and not knowing it is worse!!  Colon cancer so often starts as a polyp that can be seen and removed at colonscopy, which can quite literally save your life!  And if your first colonoscopy is normal and you have no family history of colon cancer, most women don't have to have it again for 10 years.  Once every ten years, you can do something that may end up saving your life, right?  We will be happy to help you get it scheduled when you are ready.    Be sure to check your insurance coverage so you understand how much it will cost.  It may be covered as a preventative service at no cost, but you should check your particular policy.   ? ?Calcium Content in Foods ?Calcium is the most abundant mineral in the body. Most of the body's calcium supply is stored in bones and teeth. Calcium helps many parts of the body function normally, including: ?Blood and blood vessels. ?Nerves. ?Hormones. ?Muscles. ?Bones and teeth. ?When your calcium stores are low, you may be at risk for low bone mass, bone loss, and broken bones  (fractures). When you get enough calcium, it helps to support strong bones and teeth throughout your life. ?Calcium is especially important for: ?Children during growth spurts. ?Girls during adolescence. ?Women who are pregnant or breastfeeding. ?Women after their menstrual cycle stops (postmenopause). ?Women whose menstrual cycle has stopped due to anorexia nervosa or regular intense exercise. ?People who cannot eat or digest dairy products. ?Vegans. ?Recommended daily amounts of calcium: ?Women (ages 19 to 50): 1,000 mg per day. ?Women (ages 51 and older): 1,200 mg per day. ?Men (ages 19 to 70): 1,000 mg per day. ?Men (ages 71 and older): 1,200 mg per day. ?Women (ages 9 to 18): 1,300 mg per day. ?Men (ages 9 to 18): 1,300 mg per day. ?General information ?Eat foods that are high in calcium. Try to get most of your calcium from food. ?Some people may benefit from taking calcium supplements. Check with your health care provider or diet and nutrition specialist (dietitian) before starting any calcium supplements. Calcium supplements may interact with certain medicines. Too much calcium may cause other health problems, such as constipation and kidney stones. ?For the body to absorb calcium, it needs vitamin D. Sources of vitamin D include: ?Skin exposure to direct sunlight. ?Foods, such as egg yolks, liver, mushrooms, saltwater fish, and fortified milk. ?Vitamin D supplements. Check with your health care provider or dietitian before starting any vitamin D supplements. ?What foods are high in calcium? ?Foods that are high in calcium contain more than 100 milligrams per serving. ?Fruits ?Fortified orange juice or other fruit juice, 300 mg per 8 oz serving. ?Vegetables ?Collard greens, 360 mg per 8 oz serving. ?Kale, 100 mg per 8 oz serving. ?Bok choy, 160 mg per 8 oz serving. ?Grains ?Fortified ready-to-eat cereals, 100 to 1,000 mg per 8 oz serving. ?Fortified frozen waffles, 200 mg in 2 waffles. ?Oatmeal, 140 mg in 1  cup. ?Meats and other proteins ?Sardines, canned with bones, 325 mg per 3 oz serving. ?Salmon, canned with bones, 180 mg per 3 oz serving. ?Canned shrimp, 125 mg per 3 oz serving. ?Baked beans, 160 mg per 4 oz serving. ?Tofu, firm, made with calcium sulfate, 253 mg per 4 oz serving. ?Dairy ?Yogurt, plain, low-fat, 310 mg per 6 oz serving. ?Nonfat milk, 300 mg per 8 oz serving. ?American cheese, 195 mg per 1 oz serving. ?Cheddar cheese, 205 mg per 1 oz serving. ?Cottage cheese 2%, 105 mg per 4 oz serving. ?Fortified soy, rice, or almond milk, 300 mg per 8 oz serving. ?Mozzarella, part skim, 210 mg per 1 oz serving. ?The items listed above may not be a complete list of foods high in calcium. Actual amounts of calcium may be different depending on processing. Contact a dietitian for more information. ?What foods are lower in calcium? ?Foods that are lower in calcium contain 50 mg or less per serving. ?Fruits ?Apple, about 6 mg. ?Banana, about 12 mg. ?Vegetables ?  Lettuce, 19 mg per 2 oz serving. ?Tomato, about 11 mg. ?Grains ?Rice, 4 mg per 6 oz serving. ?Boiled potatoes, 14 mg per 8 oz serving. ?White bread, 6 mg per slice. ?Meats and other proteins ?Egg, 27 mg per 2 oz serving. ?Red meat, 7 mg per 4 oz serving. ?Chicken, 17 mg per 4 oz serving. ?Fish, cod, or trout, 20 mg per 4 oz serving. ?Dairy ?Cream cheese, regular, 14 mg per 1 Tbsp serving. ?Brie cheese, 50 mg per 1 oz serving. ?Parmesan cheese, 70 mg per 1 Tbsp serving. ?The items listed above may not be a complete list of foods lower in calcium. Actual amounts of calcium may be different depending on processing. Contact a dietitian for more information. ?Summary ?Calcium is an important mineral in the body because it affects many functions. Getting enough calcium helps support strong bones and teeth throughout your life. ?Try to get most of your calcium from food. ?Calcium supplements may interact with certain medicines. Check with your health care provider or  dietitian before starting any calcium supplements. ?This information is not intended to replace advice given to you by your health care provider. Make sure you discuss any questions you have with your hea

## 2022-02-16 ENCOUNTER — Ambulatory Visit (INDEPENDENT_AMBULATORY_CARE_PROVIDER_SITE_OTHER): Payer: BC Managed Care – PPO

## 2022-02-16 ENCOUNTER — Ambulatory Visit: Payer: BC Managed Care – PPO | Admitting: Podiatry

## 2022-02-16 ENCOUNTER — Encounter: Payer: Self-pay | Admitting: Podiatry

## 2022-02-16 DIAGNOSIS — M7751 Other enthesopathy of right foot: Secondary | ICD-10-CM

## 2022-02-16 DIAGNOSIS — M21619 Bunion of unspecified foot: Secondary | ICD-10-CM

## 2022-02-16 DIAGNOSIS — L84 Corns and callosities: Secondary | ICD-10-CM

## 2022-02-16 DIAGNOSIS — R635 Abnormal weight gain: Secondary | ICD-10-CM | POA: Insufficient documentation

## 2022-02-16 DIAGNOSIS — K625 Hemorrhage of anus and rectum: Secondary | ICD-10-CM | POA: Insufficient documentation

## 2022-02-16 NOTE — Progress Notes (Signed)
Subjective:  ? ?Patient ID: Mikayla [name], female   DOB: 56 y.o.   MRN: [id number]  ? ?HPI ?Patient states that she has a painful callus on the medial side of the right big toe and states its been sore and makes it hard to walk and knows that she also has bunion deformity.  States that gradually has become more irritated over the last couple years and patient does not smoke likes to be active ? ? ?Review of Systems  ?All other systems reviewed and are negative. ? ? ?   ?Objective:  ?Physical Exam ?Vitals and nursing note reviewed.  ?Constitutional:   ?   Appearance: She is well-developed.  ?Pulmonary:  ?   Effort: Pulmonary effort is normal.  ?Musculoskeletal:     ?   General: Normal range of motion.  ?Skin: ?   General: Skin is warm.  ?Neurological:  ?   Mental Status: She is alert.  ?  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  Keratotic lesion on the medial side of the first metatarsal and the right hallux ? ?   ?Assessment:  ?Structural deformity leading to excess pressure on medial side of the foot with obesity also possibly part of this and causing keratotic tissue ? ?   ?Plan:  ?H&P x-ray reviewed sterile sharp debridement accomplished no iatrogenic bleeding applied cushioning we will see the results and if it comes back quickly may have to consider surgical intervention in this case reappoint 4 weeks ? 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   ? ? ?

## 2022-02-20 ENCOUNTER — Encounter: Payer: Self-pay | Admitting: Obstetrics and Gynecology

## 2022-02-20 DIAGNOSIS — R87610 Atypical squamous cells of undetermined significance on cytologic smear of cervix (ASC-US): Secondary | ICD-10-CM

## 2022-02-20 LAB — CYTOLOGY - PAP
Comment: NEGATIVE
Diagnosis: HIGH — AB
High risk HPV: NEGATIVE

## 2022-02-21 NOTE — Telephone Encounter (Signed)
Patient's pap showed ASCUS-H, this is atypia of the cells with possible high grade dysplasia.  ?Her high risk HPV test was negative.  ? ?I recommend a colposcopy with biopsy with me to investigate further.  ? 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  ?

## 2022-02-21 NOTE — Telephone Encounter (Signed)
I called patient and spoke with her about colposcopy. Order placed. Appointments notified to schedule.  ?

## 2022-02-22 NOTE — Telephone Encounter (Signed)
Colpo appt scheduled 03/14/22. ?

## 2022-03-06 ENCOUNTER — Ambulatory Visit: Payer: BC Managed Care – PPO | Admitting: Family Medicine

## 2022-03-06 ENCOUNTER — Encounter: Payer: Self-pay | Admitting: Family Medicine

## 2022-03-06 VITALS — BP 128/78 | HR 70 | Temp 98.2°F | Ht 61.5 in | Wt 285.2 lb

## 2022-03-06 DIAGNOSIS — R300 Dysuria: Secondary | ICD-10-CM

## 2022-03-06 LAB — POCT URINALYSIS DIPSTICK
Bilirubin, UA: NEGATIVE
Blood, UA: NEGATIVE
Glucose, UA: NEGATIVE
Ketones, UA: NEGATIVE
Nitrite, UA: NEGATIVE
Protein, UA: NEGATIVE
Spec Grav, UA: 1.025 (ref 1.010–1.025)
Urobilinogen, UA: 0.2 E.U./dL
pH, UA: 6 (ref 5.0–8.0)

## 2022-03-06 MED ORDER — NITROFURANTOIN MONOHYD MACRO 100 MG PO CAPS: 100.0000 mg | ORAL_CAPSULE | Freq: Two times a day (BID) | ORAL | 0 refills | Status: DC

## 2022-03-06 NOTE — Progress Notes (Signed)
? ?Subjective  ? ?CC:  ?Chief Complaint  ?Patient presents with  ? Urinary Frequency  ?  Pt stated that she has really been irritated in the vaginal area with a strong pee smell. Urine is very cloudy and dark. Symptoms started a couple of days ago.  ? ?Same day acute visit; PCP not available. New pt to me. Chart reviewed.  ? ?HPI: Mikayla [name] is a 56 y.o. female who presents to the office today to address the problems listed above in the chief complaint. ?Patient reports dysuria and urinary frequency.  She has sensation of increased urinary pressure.  She denies fevers flank pain nausea vomiting or gross hematuria.  Symptoms have been present for several days. She was at the beach last weekend and likely didn't drink much water.  She denies history of interstitial cystitis.  She denies vaginal symptoms including vaginal discharge or pelvic pain. Last UTI E.coli 02/2021. ? ?Assessment  ?1. Dysuria   ? ?  ?Plan  ?Likely acute UTI: await culture. Macrobid bid x 5 days. See AVS.  ? ?Follow up: prn  ?Orders Placed This Encounter  ?Procedures  ? Urine Culture  ? POCT urinalysis dipstick  ? ?Meds ordered this encounter  ?Medications  ? nitrofurantoin, macrocrystal-monohydrate, (MACROBID) 100 MG capsule  ?  Sig: Take 1 capsule (100 mg total) by mouth 2 (two) times daily.  ?  Dispense:  10 capsule  ?  Refill:  0  ? ?  ? ?I reviewed the patients updated PMH, FH, and SocHx.  ?  ?Patient Active Problem List  ? Diagnosis Date Noted  ? Rectal bleeding 02/16/2022  ? Abnormal weight gain 02/16/2022  ? Physical exam 12/25/2021  ? Leg swelling 03/15/2021  ? Positive ANA (antinuclear antibody) 10/19/2020  ? Abnormal lung sounds 10/19/2020  ? Degenerative disc disease, lumbar 05/11/2019  ? Insulin resistance 02/19/2017  ? Hyperlipidemia   ? Constipation 04/12/2015  ? 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 Dyshidrotic eczema 09/05/2012  ? Vitamin D deficiency 08/13/2012  ? Morbid obesity ([place]) 04/11/2010  ? GERD 02/10/2009   ? ?Current Meds  ?Medication Sig  ? Cholecalciferol (VITAMIN D) 50 MCG (2000 UT) tablet Take 2,000 Units by mouth daily.  ? estradiol (ESTRACE) 0.1 MG/GM vaginal cream Use 1/2 g vaginally two or three times per week as needed to maintain symptom relief.  ? nitrofurantoin, macrocrystal-monohydrate, (MACROBID) 100 MG capsule Take 1 capsule (100 mg total) by mouth 2 (two) times daily.  ? nystatin (MYCOSTATIN/NYSTOP) powder Apply 1 application. topically 3 (three) times daily. Apply to affected area for up to 7 days  ? ? ?Review of Systems: ?Cardiovascular: negative for chest pain ?Respiratory: negative for SOB or persistent cough ?Gastrointestinal: negative for abdominal pain ?Constitutional: Negative for fever malaise or anorexia ? ?Objective  ?Vitals: BP 128/78   Pulse 70   Temp 98.2 ?F (36.8 ?C)   Ht 5' 1.5" (1.562 m)   Wt 285 lb 3.2 oz (129.4 kg)   SpO2 94%   BMI 53.02 kg/m?  ?General: no acute distress  ?Psych:  Alert and oriented, normal mood and affect ?Respiratory:  normal respiratory effort ?Gastrointestinal: soft, flat abdomen, no palpable masses, NO CVAT, no suprapubic ttp or rebound or guarding ? ?Office Visit on 03/06/2022  ?Component Date Value Ref Range Status  ? Color, UA 03/06/2022 yellow   Final  ? Clarity, UA 03/06/2022 clear   Final  ? Glucose, UA 03/06/2022 Negative  Negative Final  ? Bilirubin, UA 03/06/2022 negative  Final  ? Ketones, UA 03/06/2022 negative   Final  ? Spec Grav, UA 03/06/2022 1.025  1.010 - 1.025 Final  ? Blood, UA 03/06/2022 negative   Final  ? pH, UA 03/06/2022 6.0  5.0 - 8.0 Final  ? Protein, UA 03/06/2022 Negative  Negative Final  ? Urobilinogen, UA 03/06/2022 0.2  0.2 or 1.0 E.U./dL Final  ? Nitrite, UA 03/06/2022 negative   Final  ? Leukocytes, UA 03/06/2022 Small (1+) (A)  Negative Final  ? ?Commons side effects, risks, benefits, and alternatives for medications and treatment plan prescribed today were discussed, and the patient expressed understanding of the given  instructions. Patient is instructed to call or message via MyChart if he/she has any questions or concerns regarding our treatment plan. No barriers to understanding were identified. We discussed Red Flag symptoms and signs in detail. Patient expressed understanding regarding what to do in case of urgent or emergency type symptoms.  ?Medication list was reconciled, printed and provided to the patient in AVS. Patient instructions and summary information was reviewed with the patient as documented in the AVS. ?This note was prepared with assistance of Systems analyst. Occasional wrong-word or sound-a-like substitutions may have occurred due to the inherent limitations of voice recognition software ? ? ? ?

## 2022-03-06 NOTE — Patient Instructions (Signed)
Please follow up if symptoms do not improve or as needed.   ? ?We will let you know your culture results once they return.  ? ?Urinary Tract Infection, Adult ? ?A urinary tract infection (UTI) is an infection of any part of the urinary tract. The urinary tract includes the kidneys, ureters, bladder, and urethra. These organs make, store, and get rid of urine in the body. ?An upper UTI affects the ureters and kidneys. A lower UTI affects the bladder and urethra. ?What are the causes? ?Most urinary tract infections are caused by bacteria in your genital area around your urethra, where urine leaves your body. These bacteria grow and cause inflammation of your urinary tract. ?What increases the risk? ?You are more likely to develop this condition if: ?You have a urinary catheter that stays in place. ?You are not able to control when you urinate or have a bowel movement (incontinence). ?You are female and you: ?Use a spermicide or diaphragm for birth control. ?Have low estrogen levels. ?Are pregnant. ?You have certain genes that increase your risk. ?You are sexually active. ?You take antibiotic medicines. ?You have a condition that causes your flow of urine to slow down, such as: ?An enlarged prostate, if you are female. ?Blockage in your urethra. ?A kidney stone. ?A nerve condition that affects your bladder control (neurogenic bladder). ?Not getting enough to drink, or not urinating often. ?You have certain medical conditions, such as: ?Diabetes. ?A weak disease-fighting system (immunesystem). ?Sickle cell disease. ?Gout. ?Spinal cord injury. ?What are the signs or symptoms? ?Symptoms of this condition include: ?Needing to urinate right away (urgency). ?Frequent urination. This may include small amounts of urine each time you urinate. ?Pain or burning with urination. ?Blood in the urine. ?Urine that smells bad or unusual. ?Trouble urinating. ?Cloudy urine. ?Vaginal discharge, if you are female. ?Pain in the abdomen or the  lower back. ?You may also have: ?Vomiting or a decreased appetite. ?Confusion. ?Irritability or tiredness. ?A fever or chills. ?Diarrhea. ?The first symptom in older adults may be confusion. In some cases, they may not have any symptoms until the infection has worsened. ?How is this diagnosed? ?This condition is diagnosed based on your medical history and a physical exam. You may also have other tests, including: ?Urine tests. ?Blood tests. ?Tests for STIs (sexually transmitted infections). ?If you have had more than one UTI, a cystoscopy or imaging studies may be done to determine the cause of the infections. ?How is this treated? ?Treatment for this condition includes: ?Antibiotic medicine. ?Over-the-counter medicines to treat discomfort. ?Drinking enough water to stay hydrated. ?If you have frequent infections or have other conditions such as a kidney stone, you may need to see a health care provider who specializes in the urinary tract (urologist). ?In rare cases, urinary tract infections can cause sepsis. Sepsis is a life-threatening condition that occurs when the body responds to an infection. Sepsis is treated in the hospital with IV antibiotics, fluids, and other medicines. ?Follow these instructions at home: ? ?Medicines ?Take over-the-counter and prescription medicines only as told by your health care provider. ?If you were prescribed an antibiotic medicine, take it as told by your health care provider. Do not stop using the antibiotic even if you start to feel better. ?General instructions ?Make sure you: ?Empty your bladder often and completely. Do not hold urine for long periods of time. ?Empty your bladder after sex. ?Wipe from front to back after urinating or having a bowel movement if you are female. Use  each tissue only one time when you wipe. ?Drink enough fluid to keep your urine pale yellow. ?Keep all follow-up visits. This is important. ?Contact a health care provider if: ?Your symptoms do not  get better after 1-2 days. ?Your symptoms go away and then return. ?Get help right away if: ?You have severe pain in your back or your lower abdomen. ?You have a fever or chills. ?You have nausea or vomiting. ?Summary ?A urinary tract infection (UTI) is an infection of any part of the urinary tract, which includes the kidneys, ureters, bladder, and urethra. ?Most urinary tract infections are caused by bacteria in your genital area. ?Treatment for this condition often includes antibiotic medicines. ?If you were prescribed an antibiotic medicine, take it as told by your health care provider. Do not stop using the antibiotic even if you start to feel better. ?Keep all follow-up visits. This is important. ?This information is not intended to replace advice given to you by your health care provider. Make sure you discuss any questions you have with your health care provider. ?Document Revised: 06/17/2020 Document Reviewed: 06/17/2020 ?Elsevier Patient Education ? [place]. ? ?

## 2022-03-07 ENCOUNTER — Ambulatory Visit: Payer: BC Managed Care – PPO | Admitting: Physician Assistant

## 2022-03-07 LAB — URINE CULTURE
MICRO NUMBER:: 13278371
SPECIMEN QUALITY:: ADEQUATE

## 2022-03-12 NOTE — Progress Notes (Signed)
?  Subjective:  ?  ? Patient ID: Mikayla [name], female   DOB: 22-Feb-1966, 56 y.o.   MRN: [id number] ? ?HPI ? ?Patient here today for colposcopy with pap 02-14-22 ASC-H:Neg HR HPV. ? ?Stopped Estrace cream after her visit 02/14/22.  ? ?Patient with dysuria and foul odor with urination. Feels hot with urination and some discomfort.  ?PCP treated with antibiotics but called and her and stated urine was normal. ?She took abx for 3 days and then stopped.  ? ?Pap History: ? ?History of abnormal paps years ago and patient thinks this is the reason she had hysterectomy. ? ? ?Review of Systems  ?Genitourinary:  Positive for dysuria.  ?     Urine with odor  ?All other systems reviewed and are negative. ?LMP: Hyst ? ?   ?Objective:  ? Physical Exam ? ?Colposcopy: ?Consent done.  ?Cervix absent.  ?3% acetic acid placed.  No acetowhite change. ?Mesh erosion 1 - 2 mm at mid anterior vaginal 2 cm above cuff apex.  ?Lugol's placed.  Decreased uptake right vaginal wall and mid vagina, in area separate from the mesh erosion.  ?Biopsy of each taken and sent to pathology separately.  ?Normal saline used to rinse the vagina due to patient discomfort. ?Monsel's placed on biopsy locations.  ?No complications.  ?Minimal EBL. ? ?   ?Assessment:  ?   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 ?Neg HR HPV.  ?Atrophy.  ?Mesh erosion.  ?Abnormal urine odor.  ?Status post hysterectomy for cervical dyplasia. ? ?   ?Plan:  ?   ?FU biopsy results. ?Urinalysis and culture. ?The mesh exposure may be excised in the future if desired.  ?   ?

## 2022-03-14 ENCOUNTER — Encounter: Payer: Self-pay | Admitting: Obstetrics and Gynecology

## 2022-03-14 ENCOUNTER — Ambulatory Visit: Payer: BC Managed Care – PPO | Admitting: Obstetrics and Gynecology

## 2022-03-14 ENCOUNTER — Other Ambulatory Visit (HOSPITAL_COMMUNITY)
Admission: RE | Admit: 2022-03-14 | Discharge: 2022-03-14 | Disposition: A | Discharge status: Home / Self Care | Payer: BC Managed Care – PPO | Source: Ambulatory Visit | Attending: Obstetrics and Gynecology | Admitting: Obstetrics and Gynecology

## 2022-03-14 VITALS — BP 118/70 | Ht 61.5 in | Wt 282.0 lb

## 2022-03-14 DIAGNOSIS — N890 Mild vaginal dysplasia: Secondary | ICD-10-CM | POA: Diagnosis not present

## 2022-03-14 DIAGNOSIS — R300 Dysuria: Secondary | ICD-10-CM | POA: Diagnosis not present

## 2022-03-14 DIAGNOSIS — R8290 Unspecified abnormal findings in urine: Secondary | ICD-10-CM | POA: Diagnosis not present

## 2022-03-14 DIAGNOSIS — N952 Postmenopausal atrophic vaginitis: Secondary | ICD-10-CM

## 2022-03-14 DIAGNOSIS — R87620 Atypical squamous cells of undetermined significance on cytologic smear of vagina (ASC-US): Secondary | ICD-10-CM | POA: Diagnosis not present

## 2022-03-14 DIAGNOSIS — R87610 Atypical squamous cells of undetermined significance on cytologic smear of cervix (ASC-US): Secondary | ICD-10-CM | POA: Diagnosis not present

## 2022-03-15 NOTE — Patient Instructions (Signed)
Colposcopy, Care After  The following information offers guidance on how to care for yourself after your procedure. Your doctor may also give you more specific instructions. If you have problems or questions, contact your doctor. What can I expect after the procedure? If you did not have a sample of your tissue taken out (did not have a biopsy), you may only have some spotting of blood for a few days. You can go back to your normal activities. If you had a sample of your tissue taken out, it is common to have: Soreness and mild pain. These may last for a few days. Mild bleeding or fluid (discharge) coming from your vagina. The fluid will look dark and grainy. You may have this for a few days. The fluid may be caused by a liquid that was used during your procedure. You may need to wear a sanitary pad. Spotting of blood for at least 48 hours after the procedure. Follow these instructions at home: Medicines Take over-the-counter and prescription medicines only as told by your doctor. Ask your doctor what over-the-counter pain medicines and prescription medicines you can start taking again. This is very important if you take blood thinners. Activity For at least 3 days, or for as long as told by your doctor, avoid: Douching. Using tampons. Having sex. Return to your normal activities as told by your doctor. Ask your doctor what activities are safe for you. General instructions Ask your doctor if you may take baths, swim, or use a hot tub. You may take showers. If you use birth control (contraception), keep using it. Keep all follow-up visits. Contact a doctor if: You have a fever or chills. You faint or feel light-headed. Get help right away if: You bleed a lot from your vagina. A lot of bleeding means that the bleeding soaks through a pad in less than 1 hour. You have clumps of blood (blood clots) coming from your vagina. You have signs that could mean you have an infection. This may be  fluid coming from your vagina that is: Different than normal. Yellow. Bad-smelling. You have very bad pain or cramps in your lower belly that do not get better with medicine. Summary If you did not have a sample of your tissue taken out, you may only have some spotting of blood for a few days. You can go back to your normal activities. If you had a sample of your tissue taken out, it is common to have mild pain for a few days and spotting for 48 hours. Avoid douching, using tampons, and having sex for at least 3 days after the procedure or for as long as told. Get help right away if you have a lot of bleeding, very bad pain, or signs of infection. This information is not intended to replace advice given to you by your health care provider. Make sure you discuss any questions you have with your health care provider. Document Revised: 04/02/2021 Document Reviewed: 04/02/2021 Elsevier Patient Education  2023 Elsevier Inc.  

## 2022-03-16 ENCOUNTER — Ambulatory Visit: Payer: BC Managed Care – PPO | Admitting: Podiatry

## 2022-03-16 DIAGNOSIS — M21611 Bunion of right foot: Secondary | ICD-10-CM

## 2022-03-16 DIAGNOSIS — M21619 Bunion of unspecified foot: Secondary | ICD-10-CM

## 2022-03-16 DIAGNOSIS — M2011 Hallux valgus (acquired), right foot: Secondary | ICD-10-CM

## 2022-03-16 LAB — URINALYSIS, COMPLETE W/RFL CULTURE
Bilirubin Urine: NEGATIVE
Glucose, UA: NEGATIVE
Hgb urine dipstick: NEGATIVE
Hyaline Cast: NONE SEEN /LPF
Ketones, ur: NEGATIVE
Leukocyte Esterase: NEGATIVE
Nitrites, Initial: NEGATIVE
Protein, ur: NEGATIVE
RBC / HPF: NONE SEEN /HPF (ref 0–2)
Specific Gravity, Urine: 1.028 (ref 1.001–1.035)
pH: 5.5 (ref 5.0–8.0)

## 2022-03-16 LAB — URINE CULTURE
MICRO NUMBER:: 13315028
SPECIMEN QUALITY:: ADEQUATE

## 2022-03-16 LAB — CULTURE INDICATED

## 2022-03-16 NOTE — Progress Notes (Signed)
Subjective:  ? ?Patient ID: Mikayla [name], female   DOB: 56 y.o.   MRN: [id number]  ? ?HPI ?Patient states the pain has returned in my right big toe and my bunion and I only got a couple weeks relief with the treatment that you did ? ? ?ROS ? ? ?   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  Patient has good digital perfusion well oriented x3 ? ?   ?Assessment:  ?Abnormal foot structure with bunion deformity symptomatic failure to respond to wider shoes and trimming techniques with padding along with deformity of the right big toe frontal plane deformity and keratotic lesion medial plantar aspect right hallux ? ?   ?Plan:  ?H&P discussed condition at great length reviewed x-rays and discussed that we got very minimal improvement from what we did.  I have recommended a osteotomy of the right first metatarsal to realign the bone along with a derotational Akin osteotomy to try to take pressure off the medial side of the big toe and get the pressure more on the bottom of the toe.  Patient wants to do this and I explained procedure risk and the fact there is no long-term guarantees this will fix the lesion formation but I am very hopeful it well.  She understands is completely want surgery understands total recovery can take 6 months and signed consent form after review.  I did dispense air fracture walker today and I want her to get used to it prior to surgery and bring it to surgery with her and I want her to have a shoe that offloads it on the other foot.  Patient had all questions answered and is scheduled for outpatient surgery ?   ? ? ?

## 2022-03-19 LAB — SURGICAL PATHOLOGY

## 2022-04-13 ENCOUNTER — Telehealth: Payer: BC Managed Care – PPO | Admitting: Emergency Medicine

## 2022-04-13 DIAGNOSIS — K529 Noninfective gastroenteritis and colitis, unspecified: Secondary | ICD-10-CM | POA: Diagnosis not present

## 2022-04-13 MED ORDER — ONDANSETRON HCL 4 MG PO TABS: 4.0000 mg | ORAL_TABLET | Freq: Three times a day (TID) | ORAL | 0 refills | Status: DC | PRN

## 2022-04-13 NOTE — Patient Instructions (Addendum)
  [name], thank you for joining [name], NP for today's virtual visit.  While this provider is not your primary care provider (PCP), if your PCP is located in our provider database this encounter information will be shared with them immediately following your visit.  Consent: (Patient) Mikayla [name] provided verbal consent for this virtual visit at the beginning of the encounter.  Current Medications:  Current Outpatient Medications:    ondansetron (ZOFRAN) 4 MG tablet, Take 1 tablet (4 mg total) by mouth every 8 (eight) hours as needed for nausea or vomiting., Disp: 20 tablet, Rfl: 0   Cholecalciferol (VITAMIN D) 50 MCG (2000 UT) tablet, Take 2,000 Units by mouth daily., Disp: , Rfl:    estradiol (ESTRACE) 0.1 MG/GM vaginal cream, Use 1/2 g vaginally two or three times per week as needed to maintain symptom relief., Disp: 42.5 g, Rfl: 2   nystatin (MYCOSTATIN/NYSTOP) powder, Apply 1 application. topically 3 (three) times daily. Apply to affected area for up to 7 days, Disp: 30 g, Rfl: 3   Medications ordered in this encounter:  Meds ordered this encounter  Medications   ondansetron (ZOFRAN) 4 MG tablet    Sig: Take 1 tablet (4 mg total) by mouth every 8 (eight) hours as needed for nausea or vomiting.    Dispense:  20 tablet    Refill:  0     *If you need refills on other medications prior to your next appointment, please contact your pharmacy*  Follow-Up: Call back or seek an in-person evaluation if the symptoms worsen or if the condition fails to improve as anticipated.  Other Instructions For your symptoms you may take Imodium 2 mg tablets that are over the counter at your local pharmacy. Take two tablet now and then one after each loose stool up to 6 a day.  Antibiotics are not needed for most people with diarrhea.  Drink clear liquids.  This is very important! Dehydration (the lack of fluid) can lead to a serious complication.  Start off with 1 tablespoon  every 5 minutes for 8 hours. You may begin eating bland foods after 8 hours without vomiting.  Start with saltine crackers, white bread, rice, mashed potatoes, applesauce. After 48 hours on a bland diet, you may resume a normal diet. Try to go to sleep.  Sleep often empties the stomach and relieves the need to vomit.   If you have been instructed to have an in-person evaluation today at a local Urgent Care facility, please use the link below. It will take you to a list of all of our available [facility], including address, phone number and hours of operation. Please do not delay care.  [facility]  If you or a family member do not have a primary care provider, use the link below to schedule a visit and establish care. When you choose a [facility] primary care physician or advanced practice provider, you gain a long-term partner in health. Find a Primary Care Provider  Learn more about [facility] in-office and virtual care options: [facility] Get Care Now

## 2022-04-13 NOTE — Progress Notes (Signed)
Virtual Visit Consent   [name], you are scheduled for a virtual visit with a [facility] provider today. Just as with appointments in the office, your consent must be obtained to participate. Your consent will be active for this visit and any virtual visit you may have with one of our providers in the next 365 days. If you have a MyChart account, a copy of this consent can be sent to you electronically.  As this is a virtual visit, video technology does not allow for your provider to perform a traditional examination. This may limit your provider's ability to fully assess your condition. If your provider identifies any concerns that need to be evaluated in person or the need to arrange testing (such as labs, EKG, etc.), we will make arrangements to do so. Although advances in technology are sophisticated, we cannot ensure that it will always work on either your end or our end. If the connection with a video visit is poor, the visit may have to be switched to a telephone visit. With either a video or telephone visit, we are not always able to ensure that we have a secure connection.  By engaging in this virtual visit, you consent to the provision of healthcare and authorize for your insurance to be billed (if applicable) for the services provided during this visit. Depending on your insurance coverage, you may receive a charge related to this service.  I need to obtain your verbal consent now. Are you willing to proceed with your visit today? Samarie [name] has provided verbal consent on 04/13/2022 for a virtual visit (video or telephone). [name], NP  Date: 04/13/2022 10:11 AM  Virtual Visit via Video Note   I, [name], connected with  Mikayla [name]  ([id number], Apr 21, 1966) on 04/13/22 at 10:00 AM EDT by a video-enabled telemedicine application and verified that I am speaking with the correct person using two identifiers.   Location: Patient: Virtual Visit Location  Patient: Home Provider: Virtual Visit Location Provider: Home Office   I discussed the limitations of evaluation and management by telemedicine and the availability of in person appointments. The patient expressed understanding and agreed to proceed.    History of Present Illness: Mikayla [name] is a 56 y.o. who identifies as a female who was assigned female at birth, and is being seen today for vomiting and diarrhea.  She became ill last night.  She has vomited approximately 8 times, last was about 30 minutes ago.  Denies any blood in her vomit.  She has had about 10-12 episodes of watery diarrhea.  She denies any blood in her stool.  She denies traveling out of the country recently she denies drinking from contaminated water sources.  She does not know anyone else who has similar symptoms.  She denies having fever.  She does feel she can keep some small amounts of liquids down, but she is only able to sip.  The diarrhea is not bothering her as much as the nausea.  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 topically 3 (three) times daily. Apply to affected area for up to 7 days, Disp: 30 g, Rfl: 3  Observations/Objective: Patient is well-developed, well-nourished in no acute distress.  Resting but appears as if she does not feel wellat home.  Head is normocephalic, atraumatic.  No labored breathing.  Speech is clear and coherent with logical content.  Patient is alert and oriented at baseline.    Assessment and Plan: 1. Gastroenteritis  Rx zofran, recommended imodium. Reviewed reasons for seeking higher level of care.   Follow Up Instructions: I discussed the assessment and treatment plan with the patient. The patient was provided an opportunity to ask questions and all were answered. The patient agreed with the plan and demonstrated an understanding of the instructions.  A copy of instructions were sent to the patient via MyChart unless otherwise noted below.   The patient was advised to call back or seek an in-person evaluation if the symptoms worsen or if the condition fails to improve as anticipated.  Time:  I spent 10 minutes with the patient via telehealth technology discussing the above problems/concerns.    [name], NP

## 2022-05-09 ENCOUNTER — Encounter: Payer: Self-pay | Admitting: Family Medicine

## 2022-05-09 ENCOUNTER — Ambulatory Visit: Payer: BC Managed Care – PPO | Admitting: Family Medicine

## 2022-05-09 DIAGNOSIS — F32A Depression, unspecified: Secondary | ICD-10-CM

## 2022-05-09 DIAGNOSIS — Z8249 Family history of ischemic heart disease and other diseases of the circulatory system: Secondary | ICD-10-CM

## 2022-05-09 MED ORDER — BUPROPION HCL ER (XL) 150 MG PO TB24: 150.0000 mg | ORAL_TABLET | Freq: Every day | ORAL | 3 refills | Status: DC

## 2022-05-09 MED ORDER — FUROSEMIDE 20 MG PO TABS: 20.0000 mg | ORAL_TABLET | Freq: Every day | ORAL | 3 refills | Status: DC

## 2022-05-09 NOTE — Patient Instructions (Signed)
Follow up in 4-6 weeks to recheck mood START the Wellbutrin once daily in the morning We'll call you to schedule your Cardiology and Bariatric appts Try and resume a low carb diet- you can do it!! The goal is to do 10 minutes of walking twice daily to get started USE the Lasix as needed for leg swelling Call with any questions or concerns Hang in there!!!

## 2022-05-09 NOTE — Progress Notes (Signed)
   Subjective:    Patient ID: Mikayla [name], female    DOB: June 10, 1966, 56 y.o.   MRN: [id number]  HPI Obesity- pt has gained 5 lbs since Feb.  BMI now 52.79.  pt reports 'my body is starting to feel it now'.  Has SOB w/ stairs.  Not able to work in the garden.  Mom died at 70 and dad at 73 both due to heart disease.  No regular physical activity.  Not following any particular diet.  Pt went to [facility] w/o success.  She prefers to avoid surgery.  Pt followed a low carb diet during COVID and dropped to 235 lbs.  Pt reports she is frustrated w/ herself b/c she knows she can do it but is currently lacking motivation and energy.  At times is feeling sad and overwhelmed.     Review of Systems For ROS see HPI     Objective:   Physical Exam Vitals reviewed.  Constitutional:      General: She is not in acute distress.    Appearance: Normal appearance. She is obese. She is not ill-appearing.  HENT:     Head: Normocephalic and atraumatic.  Cardiovascular:     Rate and Rhythm: Normal rate and regular rhythm.     Pulses: Normal pulses.  Pulmonary:     Effort: Pulmonary effort is normal. No respiratory distress.     Breath sounds: No wheezing.  Musculoskeletal:     Right lower leg: No edema.     Left lower leg: No edema.  Skin:    General: Skin is warm and dry.  Neurological:     General: No focal deficit present.     Mental Status: She is alert and oriented to person, place, and time.  Psychiatric:        Mood and Affect: Mood normal.        Behavior: Behavior normal.        Thought Content: Thought content normal.           Assessment & Plan:

## 2022-05-10 DIAGNOSIS — F32A Depression, unspecified: Secondary | ICD-10-CM | POA: Insufficient documentation

## 2022-05-10 DIAGNOSIS — Z8249 Family history of ischemic heart disease and other diseases of the circulatory system: Secondary | ICD-10-CM | POA: Insufficient documentation

## 2022-05-10 NOTE — Assessment & Plan Note (Signed)
New.  Mom died of MI at 20.  Pt is morbidly obese, has hx of insulin resistance and hyperlipidemia.  Given these risk factors will refer to Cards for a complete evaluation.  Pt expressed understanding and is in agreement w/ plan.

## 2022-05-10 NOTE — Assessment & Plan Note (Signed)
Deteriorated.  Pt has gained 5 lbs since Feb.  BMI is now up to 52.79  She would prefer to avoid surgery but went [facility] w/o success.  Not following a particular diet.  Reports she doesn't have the energy to work out.  Also admits to lack of motivation.  Suspect there is a component of depression that is contributing.  Will refer to [facility] for complete evaluation and med management.  Pt expressed understanding and is in agreement w/ plan.

## 2022-05-10 NOTE — Assessment & Plan Note (Signed)
New.  Pt admits to feeling sad and overwhelmed at times.  Reports emotional eating.  If she is depressed, she won't have the motivation to lose weight due to the time and energy it takes to follow a nutrition plan and exercise.  Will start Wellbutrin in hopes of improving both mood and suppressing appetite.  Pt expressed understanding and is in agreement w/ plan.

## 2022-05-14 ENCOUNTER — Encounter: Payer: Self-pay | Admitting: Family Medicine

## 2022-05-15 ENCOUNTER — Telehealth: Payer: Self-pay

## 2022-05-29 ENCOUNTER — Telehealth: Payer: Self-pay | Admitting: Family Medicine

## 2022-05-29 NOTE — Telephone Encounter (Signed)
Provided pt with the number to cardiologist Wille Glaser

## 2022-05-29 NOTE — Telephone Encounter (Signed)
Caller name: [place] (pt)  On DPR? :yes/no: Yes  Call back number: [phone]  Provider they see: Dr. Beverely Low  Reason for call: pt called needing referral to cardiologist. States that she has called multiple times inquiring about this and no has returned her calls. Please advise.

## 2022-05-31 ENCOUNTER — Other Ambulatory Visit: Payer: Self-pay | Admitting: Family Medicine

## 2022-05-31 NOTE — Telephone Encounter (Signed)
I have LM asking pt to call me back

## 2022-06-01 DIAGNOSIS — Z6841 Body Mass Index (BMI) 40.0 and over, adult: Secondary | ICD-10-CM | POA: Diagnosis not present

## 2022-06-01 DIAGNOSIS — R7303 Prediabetes: Secondary | ICD-10-CM | POA: Diagnosis not present

## 2022-06-01 DIAGNOSIS — E785 Hyperlipidemia, unspecified: Secondary | ICD-10-CM | POA: Diagnosis not present

## 2022-06-03 NOTE — Progress Notes (Signed)
Cardiology Office Note:    Date:  02/01/2023   ID:  Mikayla [name], DOB Nov 11, 1966, MRN [id number]  PCP:  Midge Minium, MD   [facility] Cardiologist:  Lenna Sciara, MD Referring MD: Midge Minium, MD   Chief Complaint/Reason for Referral: Family history of coronary artery disease  PATIENT DID NOT APPEAR FOR APPOINTMENT   ASSESSMENT:    1. Family history of early CAD   2. BMI 50.0-59.9, adult ([place])     PLAN:    In order of problems listed above: 1.  Family history of coronary artery disease:   2.  Elevated BMI:            History of Present Illness:    FOCUSED PROBLEM LIST:   1.  BMI 52 2.  Family history of coronary artery disease 3.  Depression   The patient is a 56 y.o. female with the indicated medical history here for recommendations regarding family history of coronary artery disease.  The patient was seen by her primary care provider recently.       Current Medications: No outpatient medications have been marked as taking for the 06/05/22 encounter (Office Visit) with Early Osmond, MD.     Allergies:    Anesthetics, amide and Codeine   Social History:   Social History   Tobacco Use   Smoking status: Never   Smokeless tobacco: Never  Vaping Use   Vaping Use: Never used  Substance Use Topics   Alcohol use: No   Drug use: No     Family Hx: Family History  Problem Relation Age of Onset   Heart attack Mother    Coronary artery disease Mother        triple bypass age 51   Lung cancer Mother    Diabetes Mother    Hyperlipidemia Mother    Heart disease Mother    Depression Mother    Alcoholism Mother    Eating disorder Mother    Coronary artery disease Father    Hypertension Father    Hyperlipidemia Father    Heart disease Father    Alcoholism Father    Drug abuse Father    Breast cancer Maternal Aunt    Diabetes Maternal Grandmother    Crohn's disease Daughter    Arthritis Other        both sides  of family   Depression Other        both sides of family   Alcohol abuse Other        both sides of family     Review of Systems:   Please see the history of present illness.    All other systems reviewed and are negative.     EKGs/Labs/Other Test Reviewed:    EKG:  EKG performed 2021 that I personally reviewed demonstrates sinus rhythm with possible anterior infarction pattern; EKG performed today that I personally reviewed demonstrates .  Prior CV studies:  TTE 2016 with ejection fraction of 55 to 60% without left ventricular hypertrophy or significant valvular abnormalities    Other studies Reviewed: Review of the additional studies/records demonstrates: Previous CT abdomen pelvis without aortic atherosclerosis  Recent Labs: 06/11/2022: TSH 2.61 01/15/2023: ALT 14; BUN 15; Creatinine, Ser 0.58; Hemoglobin 12.3; Magnesium 1.7; Platelets 141; Potassium 3.5; Sodium 136   Recent Lipid Panel Lab Results  Component Value Date/Time   CHOL 199 06/11/2022 10:28 AM   CHOL 245 (H) 01/14/2020 11:55 AM   TRIG 121.0 06/11/2022 10:28  AM   HDL 42.50 06/11/2022 10:28 AM   HDL 56 01/14/2020 11:55 AM   LDLCALC 132 (H) 06/11/2022 10:28 AM   LDLCALC 164 (H) 01/14/2020 11:55 AM   LDLDIRECT 130.0 01/20/2019 11:42 AM    Risk Assessment/Calculations:          Physical Exam:      Signed, Early Osmond, MD  02/01/2023 8:58 AM    [facility] [place], [place], [place]  [address] Phone: [phone]; Fax: [phone]   Note:  This document was prepared using Dragon voice recognition software and may include unintentional dictation errors.

## 2022-06-04 ENCOUNTER — Telehealth: Payer: BC Managed Care – PPO | Admitting: Physician Assistant

## 2022-06-04 DIAGNOSIS — J019 Acute sinusitis, unspecified: Secondary | ICD-10-CM

## 2022-06-04 DIAGNOSIS — B9789 Other viral agents as the cause of diseases classified elsewhere: Secondary | ICD-10-CM

## 2022-06-04 MED ORDER — FLUTICASONE PROPIONATE 50 MCG/ACT NA SUSP: 2.0000 | Freq: Every day | NASAL | 0 refills | Status: DC

## 2022-06-04 MED ORDER — AMOXICILLIN-POT CLAVULANATE 875-125 MG PO TABS: 1.0000 | ORAL_TABLET | Freq: Two times a day (BID) | ORAL | 0 refills | Status: DC

## 2022-06-04 NOTE — Patient Instructions (Signed)
[name], thank you for joining Piedad Climes, PA-C for today's virtual visit.  While this provider is not your primary care provider (PCP), if your PCP is located in our provider database this encounter information will be shared with them immediately following your visit.  Consent: (Patient) Evynn Boutelle provided verbal consent for this virtual visit at the beginning of the encounter.  Current Medications:  Current Outpatient Medications:    buPROPion (WELLBUTRIN XL) 150 MG 24 hr tablet, TAKE 1 TABLET BY MOUTH EVERY DAY, Disp: 90 tablet, Rfl: 2   estradiol (ESTRACE) 0.1 MG/GM vaginal cream, Use 1/2 g vaginally two or three times per week as needed to maintain symptom relief., Disp: 42.5 g, Rfl: 2   furosemide (LASIX) 20 MG tablet, Take 1 tablet (20 mg total) by mouth daily., Disp: 30 tablet, Rfl: 3   nystatin (MYCOSTATIN/NYSTOP) powder, Apply 1 application. topically 3 (three) times daily. Apply to affected area for up to 7 days, Disp: 30 g, Rfl: 3   Medications ordered in this encounter:  No orders of the defined types were placed in this encounter.    *If you need refills on other medications prior to your next appointment, please contact your pharmacy*  Follow-Up: Call back or seek an in-person evaluation if the symptoms worsen or if the condition fails to improve as anticipated.  Other Instructions Increase fluid intake.  Use Saline nasal spray.  Take a daily multivitamin. Use the nasal spray as directed. You can use OTC Tylenol Cold and Sinus for symptoms as well.  Place a humidifier in the bedroom.   If symptoms continue to progress -- development of sinus pain, tooth pain or worsening ear pain -- despite initial treatment, you can fill the antibiotic prescription (Augmentin) and take as directed.    Please call or return clinic if symptoms are not improving.  Sinusitis Sinusitis is redness, soreness, and swelling (inflammation) of the paranasal sinuses.  Paranasal sinuses are air pockets within the bones of your face (beneath the eyes, the middle of the forehead, or above the eyes). In healthy paranasal sinuses, mucus is able to drain out, and air is able to circulate through them by way of your nose. However, when your paranasal sinuses are inflamed, mucus and air can become trapped. This can allow bacteria and other germs to grow and cause infection. Sinusitis can develop quickly and last only a short time (acute) or continue over a long period (chronic). Sinusitis that lasts for more than 12 weeks is considered chronic.  CAUSES  Causes of sinusitis include: Allergies. Structural abnormalities, such as displacement of the cartilage that separates your nostrils (deviated septum), which can decrease the air flow through your nose and sinuses and affect sinus drainage. Functional abnormalities, such as when the small hairs (cilia) that line your sinuses and help remove mucus do not work properly or are not present. SYMPTOMS  Symptoms of acute and chronic sinusitis are the same. The primary symptoms are pain and pressure around the affected sinuses. Other symptoms include: Upper toothache. Earache. Headache. Bad breath. Decreased sense of smell and taste. A cough, which worsens when you are lying flat. Fatigue. Fever. Thick drainage from your nose, which often is green and may contain pus (purulent). Swelling and warmth over the affected sinuses. DIAGNOSIS  Your caregiver will perform a physical exam. During the exam, your caregiver may: Look in your nose for signs of abnormal growths in your nostrils (nasal polyps). Tap over the affected sinus to  check for signs of infection. View the inside of your sinuses (endoscopy) with a special imaging device with a light attached (endoscope), which is inserted into your sinuses. If your caregiver suspects that you have chronic sinusitis, one or more of the following tests may be recommended: Allergy  tests. Nasal culture A sample of mucus is taken from your nose and sent to a lab and screened for bacteria. Nasal cytology A sample of mucus is taken from your nose and examined by your caregiver to determine if your sinusitis is related to an allergy. TREATMENT  Most cases of acute sinusitis are related to a viral infection and will resolve on their own within 10 days. Sometimes medicines are prescribed to help relieve symptoms (pain medicine, decongestants, nasal steroid sprays, or saline sprays).  However, for sinusitis related to a bacterial infection, your caregiver will prescribe antibiotic medicines. These are medicines that will help kill the bacteria causing the infection.  Rarely, sinusitis is caused by a fungal infection. In theses cases, your caregiver will prescribe antifungal medicine. For some cases of chronic sinusitis, surgery is needed. Generally, these are cases in which sinusitis recurs more than 3 times per year, despite other treatments. HOME CARE INSTRUCTIONS  Drink plenty of water. Water helps thin the mucus so your sinuses can drain more easily. Use a humidifier. Inhale steam 3 to 4 times a day (for example, sit in the bathroom with the shower running). Apply a warm, moist washcloth to your face 3 to 4 times a day, or as directed by your caregiver. Use saline nasal sprays to help moisten and clean your sinuses. Take over-the-counter or prescription medicines for pain, discomfort, or fever only as directed by your caregiver. SEEK IMMEDIATE MEDICAL CARE IF: You have increasing pain or severe headaches. You have nausea, vomiting, or drowsiness. You have swelling around your face. You have vision problems. You have a stiff neck. You have difficulty breathing. MAKE SURE YOU:  Understand these instructions. Will watch your condition. Will get help right away if you are not doing well or get worse. Document Released: 11/05/2005 Document Revised: 01/28/2012 Document  Reviewed: 11/20/2011 [facility] Patient Information 2014 [place], [place].    If you have been instructed to have an in-person evaluation today at a local Urgent Care facility, please use the link below. It will take you to a list of all of our available [facility], including address, phone number and hours of operation. Please do not delay care.  [facility]  If you or a family member do not have a primary care provider, use the link below to schedule a visit and establish care. When you choose a [facility] primary care physician or advanced practice provider, you gain a long-term partner in health. Find a Primary Care Provider  Learn more about [facility]'s in-office and virtual care options: [facility]

## 2022-06-04 NOTE — Progress Notes (Signed)
Virtual Visit Consent   Mikayla [name], you are scheduled for a virtual visit with a [facility] provider today. Just as with appointments in the office, your consent must be obtained to participate. Your consent will be active for this visit and any virtual visit you may have with one of our providers in the next 365 days. If you have a MyChart account, a copy of this consent can be sent to you electronically.  As this is a virtual visit, video technology does not allow for your provider to perform a traditional examination. This may limit your provider's ability to fully assess your condition. If your provider identifies any concerns that need to be evaluated in person or the need to arrange testing (such as labs, EKG, etc.), we will make arrangements to do so. Although advances in technology are sophisticated, we cannot ensure that it will always work on either your end or our end. If the connection with a video visit is poor, the visit may have to be switched to a telephone visit. With either a video or telephone visit, we are not always able to ensure that we have a secure connection.  By engaging in this virtual visit, you consent to the provision of healthcare and authorize for your insurance to be billed (if applicable) for the services provided during this visit. Depending on your insurance coverage, you may receive a charge related to this service.  I need to obtain your verbal consent now. Are you willing to proceed with your visit today? Delynda Sepulveda has provided verbal consent on 06/04/2022 for a virtual visit (video or telephone). Mikayla [name], [place]  Date: 06/04/2022 4:20 PM  Virtual Visit via Video Note   I, Mikayla [name], connected with  Ahmari Garton  ([id number], 1965-11-28) on 06/04/22 at  4:00 PM EDT by a video-enabled telemedicine application and verified that I am speaking with the correct person using two identifiers.  Location: Patient: Virtual Visit  Location Patient: Home Provider: Virtual Visit Location Provider: Home Office   I discussed the limitations of evaluation and management by telemedicine and the availability of in person appointments. The patient expressed understanding and agreed to proceed.    History of Present Illness: Mikayla [name] is a 56 y.o. who identifies as a female who was assigned female at birth, and is being seen today for a few days of sore throat, nasal and head congestion, fever, and L ear pressure with muffled hearing over the past couple of days. Has taken a COVID test due to symptoms and this was negative. Denies significant chest congestion. Denies chest pain or SOB. Denies sinus pain. Does have history of sinus infections and is concerned of this.   Has taken Tylenol OTC.    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/05/2012   Vitamin D deficiency 08/13/2012   Morbid obesity (HCC) 04/11/2010   GERD 02/10/2009    Allergies:  Allergies  Allergen Reactions   Codeine Itching and Other (See Comments)  Medications:  Current Outpatient Medications:    amoxicillin-clavulanate (AUGMENTIN) 875-125 MG tablet, Take 1 tablet by mouth 2 (two) times daily., Disp: 14 tablet, Rfl: 0   fluticasone (FLONASE) 50 MCG/ACT nasal spray, Place 2 sprays into both nostrils daily., Disp: 16 g, Rfl: 0   phentermine 15 MG capsule, Take by mouth., Disp: , Rfl:    topiramate (TOPAMAX) 25 MG tablet, Take 1 tablet by mouth every evening., Disp: ,  Rfl:    buPROPion (WELLBUTRIN XL) 150 MG 24 hr tablet, TAKE 1 TABLET BY MOUTH EVERY DAY, Disp: 90 tablet, Rfl: 2   estradiol (ESTRACE) 0.1 MG/GM vaginal cream, Use 1/2 g vaginally two or three times per week as needed to maintain symptom relief., Disp: 42.5 g, Rfl: 2   furosemide (LASIX) 20 MG tablet, Take 1 tablet (20 mg total) by mouth daily., Disp: 30 tablet, Rfl: 3  Observations/Objective: Patient is well-developed, well-nourished in no acute distress.  Resting comfortably at home.  Head is normocephalic, atraumatic.  No labored breathing. Speech is clear and coherent with logical content.  Patient is alert and oriented at baseline.   Assessment and Plan: 1. Acute viral sinusitis - fluticasone (FLONASE) 50 MCG/ACT nasal spray; Place 2 sprays into both nostrils daily.  Dispense: 16 g; Refill: 0 - amoxicillin-clavulanate (AUGMENTIN) 875-125 MG tablet; Take 1 tablet by mouth 2 (two) times daily.  Dispense: 14 tablet; Refill: 0  Rx Flonase.  Tylenol Cold and Sinus OTC. Increase fluids.  Rest.  Saline nasal spray.  Probiotic.  Mucinex as directed.  Humidifier in bedroom. She is adamant about her history of bacterial sinusitis and AOM so will put Augmentin on file at pharmacy to start if symptoms continue to progress despite initial treatment.  Call or return to clinic if symptoms are not improving.   Follow Up Instructions: I discussed the assessment and treatment plan with the patient. The patient was provided an opportunity to ask questions and all were answered. The patient agreed with the plan and demonstrated an understanding of the instructions.  A copy of instructions were sent to the patient via MyChart unless otherwise noted below.   The patient was advised to call back or seek an in-person evaluation if the symptoms worsen or if the condition fails to improve as anticipated.  Time:  I spent 10 minutes with the patient via telehealth technology discussing the above  problems/concerns.    Mikayla Climes, PA-C

## 2022-06-05 ENCOUNTER — Ambulatory Visit (INDEPENDENT_AMBULATORY_CARE_PROVIDER_SITE_OTHER): Payer: BC Managed Care – PPO | Admitting: Internal Medicine

## 2022-06-05 DIAGNOSIS — Z6841 Body Mass Index (BMI) 40.0 and over, adult: Secondary | ICD-10-CM

## 2022-06-05 DIAGNOSIS — Z8249 Family history of ischemic heart disease and other diseases of the circulatory system: Secondary | ICD-10-CM

## 2022-06-11 ENCOUNTER — Ambulatory Visit: Payer: BC Managed Care – PPO | Admitting: Family Medicine

## 2022-06-11 ENCOUNTER — Encounter: Payer: Self-pay | Admitting: Family Medicine

## 2022-06-11 VITALS — BP 120/86 | HR 75 | Temp 97.8°F | Resp 19 | Ht 61.5 in | Wt 273.4 lb

## 2022-06-11 DIAGNOSIS — F32A Depression, unspecified: Secondary | ICD-10-CM | POA: Diagnosis not present

## 2022-06-11 LAB — BASIC METABOLIC PANEL
BUN: 14 mg/dL (ref 6–23)
CO2: 28 mEq/L (ref 19–32)
Calcium: 9.3 mg/dL (ref 8.4–10.5)
Chloride: 103 mEq/L (ref 96–112)
Creatinine, Ser: 0.57 mg/dL (ref 0.40–1.20)
GFR: 100.45 mL/min (ref >60.00)
Glucose, Bld: 92 mg/dL (ref 70–99)
Potassium: 4.1 mEq/L (ref 3.5–5.1)
Sodium: 138 mEq/L (ref 135–145)

## 2022-06-11 LAB — LIPID PANEL
Cholesterol: 199 mg/dL (ref 0–200)
HDL: 42.5 mg/dL (ref >39.00)
LDL Cholesterol: 132 mg/dL — ABNORMAL HIGH (ref 0–99)
NonHDL: 156.02
Total CHOL/HDL Ratio: 5
Triglycerides: 121 mg/dL (ref 0.0–149.0)
VLDL: 24.2 mg/dL (ref 0.0–40.0)

## 2022-06-11 LAB — HEPATIC FUNCTION PANEL
ALT: 18 U/L (ref 0–35)
AST: 19 U/L (ref 0–37)
Albumin: 4.1 g/dL (ref 3.5–5.2)
Alkaline Phosphatase: 63 U/L (ref 39–117)
Bilirubin, Direct: 0 mg/dL (ref 0.0–0.3)
Total Bilirubin: 0.4 mg/dL (ref 0.2–1.2)
Total Protein: 7.2 g/dL (ref 6.0–8.3)

## 2022-06-11 LAB — CBC WITH DIFFERENTIAL/PLATELET
Basophils Absolute: 0 10*3/uL (ref 0.0–0.1)
Basophils Relative: 0.4 % (ref 0.0–3.0)
Eosinophils Absolute: 0.1 10*3/uL (ref 0.0–0.7)
Eosinophils Relative: 2.7 % (ref 0.0–5.0)
HCT: 39.3 % (ref 36.0–46.0)
Hemoglobin: 12.7 g/dL (ref 12.0–15.0)
Lymphocytes Relative: 28.8 % (ref 12.0–46.0)
Lymphs Abs: 1.4 10*3/uL (ref 0.7–4.0)
MCHC: 32.4 g/dL (ref 30.0–36.0)
MCV: 90.9 fl (ref 78.0–100.0)
Monocytes Absolute: 0.5 10*3/uL (ref 0.1–1.0)
Monocytes Relative: 10 % (ref 3.0–12.0)
Neutro Abs: 2.9 10*3/uL (ref 1.4–7.7)
Neutrophils Relative %: 58.1 % (ref 43.0–77.0)
Platelets: 166 10*3/uL (ref 150.0–400.0)
RBC: 4.32 Mil/uL (ref 3.87–5.11)
RDW: 14.1 % (ref 11.5–15.5)
WBC: 4.9 10*3/uL (ref 4.0–10.5)

## 2022-06-11 LAB — TSH: TSH: 2.61 u[IU]/mL (ref 0.35–5.50)

## 2022-06-11 LAB — VITAMIN D 25 HYDROXY (VIT D DEFICIENCY, FRACTURES): VITD: 21.63 ng/mL — ABNORMAL LOW (ref 30.00–100.00)

## 2022-06-11 NOTE — Assessment & Plan Note (Signed)
Improved w/ addition of Wellbutrin XL 150mg  daily.  No longer feeling overwhelmed or sad all the time.  Husband has noticed an improvement.  No changes at this time.

## 2022-06-11 NOTE — Patient Instructions (Signed)
Schedule your complete physical for February We'll notify you of your lab results and make any changes if needed Continue to work on low carb diet and regular exercise- you're doing great!! Call with any questions or concerns Enjoy the rest of your summer!!!

## 2022-06-11 NOTE — Assessment & Plan Note (Signed)
Improving.  Pt is down 11 lbs in 1 month.  She reports feeling better, is being more active, is doing some meal planning and following a low carb diet.  Applauded her efforts.  Check labs to risk stratify.  Will follow.

## 2022-06-11 NOTE — Progress Notes (Signed)
   Subjective:    Patient ID: Mikayla [name], female    DOB: 11/16/1966, 56 y.o.   MRN: [id number]  HPI Depression- at last visit pt was started Bupropion XL 150mg  daily.  Pt reports feeling 'a little bit more energized'.  No longer feeling sad and overwhelmed.    Morbid Obesity- pt is down 11 lbs since 6/21.  BMI now 50.82.  Pt saw [facility] in [place] and they started her on Phentermine and Topamax.  Pt did not tolerate this well and reports she felt 'terrible'.  Pt is doing low carb diet.  Has been meal planning   Review of Systems For ROS see HPI     Objective:   Physical Exam Vitals reviewed.  Constitutional:      General: She is not in acute distress.    Appearance: Normal appearance. She is well-developed. She is obese. She is not ill-appearing.  HENT:     Head: Normocephalic and atraumatic.  Eyes:     Conjunctiva/sclera: Conjunctivae normal.     Pupils: Pupils are equal, round, and reactive to light.  Neck:     Thyroid: No thyromegaly.  Cardiovascular:     Rate and Rhythm: Normal rate and regular rhythm.     Heart sounds: Normal heart sounds. No murmur heard. Pulmonary:     Effort: Pulmonary effort is normal. No respiratory distress.     Breath sounds: Normal breath sounds.  Abdominal:     General: There is no distension.     Palpations: Abdomen is soft.     Tenderness: There is no abdominal tenderness.  Musculoskeletal:     Cervical back: Normal range of motion and neck supple.     Right lower leg: No edema.     Left lower leg: No edema.  Lymphadenopathy:     Cervical: No cervical adenopathy.  Skin:    General: Skin is warm and dry.  Neurological:     General: No focal deficit present.     Mental Status: She is alert and oriented to person, place, and time.  Psychiatric:        Mood and Affect: Mood normal.        Behavior: Behavior normal.           Assessment & Plan:

## 2022-06-12 ENCOUNTER — Other Ambulatory Visit: Payer: Self-pay

## 2022-06-12 MED ORDER — VITAMIN D (ERGOCALCIFEROL) 1.25 MG (50000 UNIT) PO CAPS: 50000.0000 [IU] | ORAL_CAPSULE | ORAL | 1 refills | Status: DC

## 2022-06-12 NOTE — Progress Notes (Signed)
Pt seen results via my chart . Vitamin D sent to pharmacy

## 2022-06-27 ENCOUNTER — Encounter (INDEPENDENT_AMBULATORY_CARE_PROVIDER_SITE_OTHER): Payer: Self-pay

## 2022-07-02 DIAGNOSIS — Z6841 Body Mass Index (BMI) 40.0 and over, adult: Secondary | ICD-10-CM | POA: Diagnosis not present

## 2022-07-02 DIAGNOSIS — R7303 Prediabetes: Secondary | ICD-10-CM | POA: Diagnosis not present

## 2022-07-03 DIAGNOSIS — K219 Gastro-esophageal reflux disease without esophagitis: Secondary | ICD-10-CM | POA: Diagnosis not present

## 2022-07-03 DIAGNOSIS — M5431 Sciatica, right side: Secondary | ICD-10-CM | POA: Diagnosis not present

## 2022-07-03 DIAGNOSIS — M25561 Pain in right knee: Secondary | ICD-10-CM | POA: Diagnosis not present

## 2022-07-04 ENCOUNTER — Encounter (HOSPITAL_BASED_OUTPATIENT_CLINIC_OR_DEPARTMENT_OTHER): Payer: Self-pay

## 2022-07-04 DIAGNOSIS — F419 Anxiety disorder, unspecified: Secondary | ICD-10-CM | POA: Insufficient documentation

## 2022-07-04 DIAGNOSIS — R600 Localized edema: Secondary | ICD-10-CM | POA: Insufficient documentation

## 2022-07-04 DIAGNOSIS — F4322 Adjustment disorder with anxiety: Secondary | ICD-10-CM | POA: Insufficient documentation

## 2022-07-04 DIAGNOSIS — K635 Polyp of colon: Secondary | ICD-10-CM | POA: Insufficient documentation

## 2022-07-04 DIAGNOSIS — R32 Unspecified urinary incontinence: Secondary | ICD-10-CM | POA: Insufficient documentation

## 2022-07-04 DIAGNOSIS — M549 Dorsalgia, unspecified: Secondary | ICD-10-CM | POA: Insufficient documentation

## 2022-07-05 DIAGNOSIS — K219 Gastro-esophageal reflux disease without esophagitis: Secondary | ICD-10-CM | POA: Diagnosis not present

## 2022-07-05 DIAGNOSIS — Z6841 Body Mass Index (BMI) 40.0 and over, adult: Secondary | ICD-10-CM | POA: Diagnosis not present

## 2022-07-09 DIAGNOSIS — Z6841 Body Mass Index (BMI) 40.0 and over, adult: Secondary | ICD-10-CM | POA: Diagnosis not present

## 2022-07-09 DIAGNOSIS — G4710 Hypersomnia, unspecified: Secondary | ICD-10-CM | POA: Diagnosis not present

## 2022-07-10 ENCOUNTER — Ambulatory Visit (HOSPITAL_BASED_OUTPATIENT_CLINIC_OR_DEPARTMENT_OTHER): Payer: BC Managed Care – PPO | Admitting: Cardiology

## 2022-07-10 VITALS — BP 110/72 | HR 70 | Ht 61.5 in | Wt 270.5 lb

## 2022-07-10 DIAGNOSIS — Z8249 Family history of ischemic heart disease and other diseases of the circulatory system: Secondary | ICD-10-CM | POA: Diagnosis not present

## 2022-07-10 DIAGNOSIS — E7800 Pure hypercholesterolemia, unspecified: Secondary | ICD-10-CM

## 2022-07-10 DIAGNOSIS — Z7189 Other specified counseling: Secondary | ICD-10-CM

## 2022-07-10 DIAGNOSIS — Z6841 Body Mass Index (BMI) 40.0 and over, adult: Secondary | ICD-10-CM
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
## 2022-07-10 NOTE — Patient Instructions (Signed)
Medication Instructions:  Your Physician recommend you continue on your current medication as directed.    *If you need a refill on your cardiac medications before your next appointment, please call your pharmacy*   Lab Work: None ordered today   Testing/Procedures: CT coronary calcium score.   Test locations:  [place]   This is $99 out of pocket.   Coronary CalciumScan A coronary calcium scan is an imaging test used to look for deposits of calcium and other fatty materials (plaques) in the inner lining of the blood vessels of the heart (coronary arteries). These deposits of calcium and plaques can partly clog and narrow the coronary arteries without producing any symptoms or warning signs. This puts a person at risk for a heart attack. This test can detect these deposits before symptoms develop. Tell a health care provider about: Any allergies you have. All medicines you are taking, including vitamins, herbs, eye drops, creams, and over-the-counter medicines. Any problems you or family members have had with anesthetic medicines. Any blood disorders you have. Any surgeries you have had. Any medical conditions you have. Whether you are pregnant or may be pregnant. What are the risks? Generally, this is a safe procedure. However, problems may occur, including: Harm to a pregnant woman and her unborn baby. This test involves the use of radiation. Radiation exposure can be dangerous to a pregnant woman and her unborn baby. If you are pregnant, you generally should not have this procedure done. Slight increase in the risk of cancer. This is because of the radiation involved in the test. What happens before the procedure? No preparation is needed for this procedure. What happens during the procedure? You will undress and remove any jewelry around your neck or chest. You will put on a hospital gown. Sticky electrodes will be placed on your chest. The electrodes will be  connected to an electrocardiogram (ECG) machine to record a tracing of the electrical activity of your heart. A CT scanner will take pictures of your heart. During this time, you will be asked to lie still and hold your breath for 2-3 seconds while a picture of your heart is being taken. The procedure may vary among health care providers and hospitals. What happens after the procedure? You can get dressed. You can return to your normal activities. It is up to you to get the results of your test. Ask your health care provider, or the department that is doing the test, when your results will be ready. Summary A coronary calcium scan is an imaging test used to look for deposits of calcium and other fatty materials (plaques) in the inner lining of the blood vessels of the heart (coronary arteries). Generally, this is a safe procedure. Tell your health care provider if you are pregnant or may be pregnant. No preparation is needed for this procedure. A CT scanner will take pictures of your heart. You can return to your normal activities after the scan is done. This information is not intended to replace advice given to you by your health care provider. Make sure you discuss any questions you have with your health care provider. Document Released: 05/03/2008 Document Revised: 09/24/2016 Document Reviewed: 09/24/2016 Elsevier Interactive Patient Education  2017 [place]: At [facility], you and your health needs are our priority.  As part of our continuing mission to provide you with exceptional heart care, we have created designated Provider Care Teams.  These Care Teams include your primary Cardiologist (  physician) and Advanced Practice Providers (APPs -  Physician Assistants and Nurse Practitioners) who all work together to provide you with the care you need, when you need it.  We recommend signing up for the patient portal called "MyChart".  Sign up information is provided on  this After Visit Summary.  MyChart is used to connect with patients for Virtual Visits (Telemedicine).  Patients are able to view lab/test results, encounter notes, upcoming appointments, etc.  Non-urgent messages can be sent to your provider as well.   To learn more about what you can do with MyChart, go to [email or web address].    Your next appointment:   Based of test results   The format for your next appointment:   In Person  Provider:   Jodelle Red, MD{       '

## 2022-07-11 ENCOUNTER — Ambulatory Visit (HOSPITAL_BASED_OUTPATIENT_CLINIC_OR_DEPARTMENT_OTHER)
Admission: RE | Admit: 2022-07-11 | Discharge: 2022-07-11 | Disposition: A | Discharge status: Home / Self Care | Payer: BC Managed Care – PPO | Source: Ambulatory Visit | Attending: Cardiology | Admitting: Cardiology

## 2022-07-11 ENCOUNTER — Encounter (HOSPITAL_BASED_OUTPATIENT_CLINIC_OR_DEPARTMENT_OTHER): Payer: Self-pay

## 2022-07-11 DIAGNOSIS — Z8249 Family history of ischemic heart disease and other diseases of the circulatory system: Secondary | ICD-10-CM | POA: Insufficient documentation

## 2022-07-12 DIAGNOSIS — Z01818 Encounter for other preprocedural examination: Secondary | ICD-10-CM | POA: Diagnosis not present

## 2022-07-12 DIAGNOSIS — K219 Gastro-esophageal reflux disease without esophagitis: Secondary | ICD-10-CM | POA: Diagnosis not present

## 2022-07-12 DIAGNOSIS — K5710 Diverticulosis of small intestine without perforation or abscess without bleeding: Secondary | ICD-10-CM | POA: Diagnosis not present

## 2022-07-12 DIAGNOSIS — Z9884 Bariatric surgery status: Secondary | ICD-10-CM | POA: Diagnosis not present

## 2022-07-12 DIAGNOSIS — Z6841 Body Mass Index (BMI) 40.0 and over, adult: Secondary | ICD-10-CM | POA: Diagnosis not present

## 2022-07-20 ENCOUNTER — Other Ambulatory Visit: Payer: Self-pay

## 2022-07-20 DIAGNOSIS — I77810 Thoracic aortic ectasia: Secondary | ICD-10-CM

## 2022-07-20 DIAGNOSIS — Z01812 Encounter for preprocedural laboratory examination: Secondary | ICD-10-CM

## 2022-07-25 DIAGNOSIS — K219 Gastro-esophageal reflux disease without esophagitis: Secondary | ICD-10-CM | POA: Diagnosis not present

## 2022-07-25 DIAGNOSIS — Z713 Dietary counseling and surveillance: Secondary | ICD-10-CM | POA: Diagnosis not present

## 2022-07-25 DIAGNOSIS — Z6841 Body Mass Index (BMI) 40.0 and over, adult: Secondary | ICD-10-CM | POA: Diagnosis not present

## 2022-07-31 DIAGNOSIS — G4733 Obstructive sleep apnea (adult) (pediatric): Secondary | ICD-10-CM | POA: Diagnosis not present

## 2022-08-01 DIAGNOSIS — G4733 Obstructive sleep apnea (adult) (pediatric): Secondary | ICD-10-CM | POA: Diagnosis not present

## 2022-08-03 DIAGNOSIS — M5431 Sciatica, right side: Secondary | ICD-10-CM | POA: Diagnosis not present

## 2022-08-03 DIAGNOSIS — Z6841 Body Mass Index (BMI) 40.0 and over, adult: Secondary | ICD-10-CM | POA: Diagnosis not present

## 2022-08-03 DIAGNOSIS — G4733 Obstructive sleep apnea (adult) (pediatric): Secondary | ICD-10-CM | POA: Diagnosis not present

## 2022-08-03 DIAGNOSIS — M25561 Pain in right knee: Secondary | ICD-10-CM | POA: Diagnosis not present

## 2022-08-04 ENCOUNTER — Other Ambulatory Visit: Payer: Self-pay | Admitting: Family Medicine

## 2022-08-09 DIAGNOSIS — Z713 Dietary counseling and surveillance: Secondary | ICD-10-CM | POA: Diagnosis not present

## 2022-08-09 DIAGNOSIS — Z6841 Body Mass Index (BMI) 40.0 and over, adult: Secondary | ICD-10-CM | POA: Diagnosis not present

## 2022-08-09 DIAGNOSIS — K219 Gastro-esophageal reflux disease without esophagitis: Secondary | ICD-10-CM | POA: Diagnosis not present

## 2022-08-10 DIAGNOSIS — G4733 Obstructive sleep apnea (adult) (pediatric): Secondary | ICD-10-CM | POA: Diagnosis not present

## 2022-08-12 ENCOUNTER — Encounter (HOSPITAL_BASED_OUTPATIENT_CLINIC_OR_DEPARTMENT_OTHER): Payer: Self-pay | Admitting: Cardiology

## 2022-08-19 DIAGNOSIS — G4733 Obstructive sleep apnea (adult) (pediatric): Secondary | ICD-10-CM | POA: Diagnosis not present

## 2022-08-22 DIAGNOSIS — Z6841 Body Mass Index (BMI) 40.0 and over, adult: Secondary | ICD-10-CM | POA: Diagnosis not present

## 2022-08-22 DIAGNOSIS — R7303 Prediabetes: Secondary | ICD-10-CM | POA: Diagnosis not present

## 2022-08-29 DIAGNOSIS — Z6841 Body Mass Index (BMI) 40.0 and over, adult: Secondary | ICD-10-CM | POA: Diagnosis not present

## 2022-08-29 DIAGNOSIS — M25561 Pain in right knee: Secondary | ICD-10-CM | POA: Diagnosis not present

## 2022-08-29 DIAGNOSIS — K219 Gastro-esophageal reflux disease without esophagitis: Secondary | ICD-10-CM | POA: Diagnosis not present

## 2022-08-29 DIAGNOSIS — G4733 Obstructive sleep apnea (adult) (pediatric): Secondary | ICD-10-CM | POA: Diagnosis not present

## 2022-09-13 DIAGNOSIS — Z8616 Personal history of COVID-19: Secondary | ICD-10-CM | POA: Diagnosis not present

## 2022-09-13 DIAGNOSIS — M170 Bilateral primary osteoarthritis of knee: Secondary | ICD-10-CM | POA: Diagnosis not present

## 2022-09-13 DIAGNOSIS — Z6841 Body Mass Index (BMI) 40.0 and over, adult: Secondary | ICD-10-CM | POA: Diagnosis not present

## 2022-09-13 DIAGNOSIS — E785 Hyperlipidemia, unspecified: Secondary | ICD-10-CM | POA: Diagnosis not present

## 2022-09-13 DIAGNOSIS — Z79899 Other long term (current) drug therapy: Secondary | ICD-10-CM | POA: Diagnosis not present

## 2022-09-13 DIAGNOSIS — G4733 Obstructive sleep apnea (adult) (pediatric): Secondary | ICD-10-CM | POA: Diagnosis not present

## 2022-09-13 DIAGNOSIS — Z885 Allergy status to narcotic agent status: Secondary | ICD-10-CM | POA: Diagnosis not present

## 2022-09-13 HISTORY — PX: GASTRIC BYPASS: SHX52

## 2022-09-19 DIAGNOSIS — G4733 Obstructive sleep apnea (adult) (pediatric): Secondary | ICD-10-CM | POA: Diagnosis not present

## 2022-09-24 DIAGNOSIS — Z713 Dietary counseling and surveillance: Secondary | ICD-10-CM | POA: Diagnosis not present

## 2022-09-24 DIAGNOSIS — K219 Gastro-esophageal reflux disease without esophagitis: Secondary | ICD-10-CM | POA: Diagnosis not present

## 2022-09-24 DIAGNOSIS — Z6841 Body Mass Index (BMI) 40.0 and over, adult: Secondary | ICD-10-CM | POA: Diagnosis not present

## 2022-09-27 DIAGNOSIS — H25811 Combined forms of age-related cataract, right eye: Secondary | ICD-10-CM | POA: Diagnosis not present

## 2022-09-27 DIAGNOSIS — H5203 Hypermetropia, bilateral: Secondary | ICD-10-CM | POA: Diagnosis not present

## 2022-09-27 DIAGNOSIS — E119 Type 2 diabetes mellitus without complications: Secondary | ICD-10-CM | POA: Diagnosis not present

## 2022-10-08 DIAGNOSIS — Z9884 Bariatric surgery status: Secondary | ICD-10-CM | POA: Diagnosis not present

## 2022-10-08 DIAGNOSIS — Z713 Dietary counseling and surveillance: Secondary | ICD-10-CM | POA: Diagnosis not present

## 2022-10-08 DIAGNOSIS — K219 Gastro-esophageal reflux disease without esophagitis: Secondary | ICD-10-CM | POA: Diagnosis not present

## 2022-10-19 DIAGNOSIS — G4733 Obstructive sleep apnea (adult) (pediatric): Secondary | ICD-10-CM | POA: Diagnosis not present

## 2022-10-25 ENCOUNTER — Telehealth: Payer: BC Managed Care – PPO | Admitting: Physician Assistant

## 2022-10-25 DIAGNOSIS — B9689 Other specified bacterial agents as the cause of diseases classified elsewhere: Secondary | ICD-10-CM

## 2022-10-25 DIAGNOSIS — J208 Acute bronchitis due to other specified organisms: Secondary | ICD-10-CM

## 2022-10-25 MED ORDER — BENZONATATE 100 MG PO CAPS: 100.0000 mg | ORAL_CAPSULE | Freq: Three times a day (TID) | ORAL | 0 refills | Status: DC | PRN

## 2022-10-25 MED ORDER — AZITHROMYCIN 250 MG PO TABS: ORAL_TABLET | ORAL | 0 refills | Status: AC

## 2022-10-25 NOTE — Progress Notes (Signed)
Virtual Visit Consent   Mikayla [name], you are scheduled for a virtual visit with a [facility] provider today. Just as with appointments in the office, your consent must be obtained to participate. Your consent will be active for this visit and any virtual visit you may have with one of our providers in the next 365 days. If you have a MyChart account, a copy of this consent can be sent to you electronically.  As this is a virtual visit, video technology does not allow for your provider to perform a traditional examination. This may limit your provider's ability to fully assess your condition. If your provider identifies any concerns that need to be evaluated in person or the need to arrange testing (such as labs, EKG, etc.), we will make arrangements to do so. Although advances in technology are sophisticated, we cannot ensure that it will always work on either your end or our end. If the connection with a video visit is poor, the visit may have to be switched to a telephone visit. With either a video or telephone visit, we are not always able to ensure that we have a secure connection.  By engaging in this virtual visit, you consent to the provision of healthcare and authorize for your insurance to be billed (if applicable) for the services provided during this visit. Depending on your insurance coverage, you may receive a charge related to this service.  I need to obtain your verbal consent now. Are you willing to proceed with your visit today? Mikayla [name] has provided verbal consent on 10/25/2022 for a virtual visit (video or telephone). Piedad Climes, [place]  Date: 10/25/2022 12:47 PM  Virtual Visit via Video Note   I, Piedad Climes, connected with  Mikayla [name]  ([id number], [birth year]) on 10/25/22 at 12:30 PM EST by a video-enabled telemedicine application and verified that I am speaking with the correct person using two identifiers.  Location: Patient: Virtual Visit  Location Patient: Home Provider: Virtual Visit Location Provider: Home Office   I discussed the limitations of evaluation and management by telemedicine and the availability of in person appointments. The patient expressed understanding and agreed to proceed.    History of Present Illness: Mikayla [name] is a 56 y.o. who identifies as a female who was assigned female at birth, and is being seen today for chest congestion and cough that is now productive and associated with a low-grade fever. Some sore throat. Denies sinus pressure or sinus pain, chest pain or SOB. 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   HPI: HPI  Problems:  Patient Active Problem List   Diagnosis Date Noted   Urinary incontinence 07/04/2022   GERD 07/04/2022   Colon polyps 07/04/2022   Back pain 07/04/2022   Anxiety 07/04/2022   Adjustment disorder with anxiety 07/04/2022   Family history of early CAD 05/10/2022   Depression 05/10/2022   Rectal bleeding 02/16/2022   Abnormal weight gain 02/16/2022   Physical exam 12/25/2021   Leg swelling 03/15/2021   Positive ANA (antinuclear antibody) 10/19/2020   Abnormal lung sounds 10/19/2020   Degenerative disc disease, lumbar 05/11/2019   Insulin resistance 02/19/2017   Dyshidrotic eczema 09/05/2012   Vitamin D deficiency 08/13/2012   Morbid obesity (HCC) 04/11/2010    Allergies:  Allergies  Allergen Reactions   Codeine Itching and Other (See Comments)   Medications:  Current Outpatient Medications:    azithromycin (ZITHROMAX) 250 MG tablet, Take 2 tablets on day 1, then 1 tablet  daily on days 2 through 5, Disp: 6 tablet, Rfl: 0   benzonatate (TESSALON) 100 MG capsule, Take 1 capsule (100 mg total) by mouth 3 (three) times daily as needed for cough., Disp: 30 capsule, Rfl: 0   buPROPion (WELLBUTRIN XL) 150 MG 24 hr tablet, TAKE 1 TABLET BY MOUTH EVERY DAY, Disp: 90 tablet, Rfl: 2   estradiol (ESTRACE) 0.1 MG/GM vaginal cream, Use 1/2 g vaginally two or three times per week  as needed to maintain symptom relief., Disp: 42.5 g, Rfl: 2   fluticasone (FLONASE) 50 MCG/ACT nasal spray, Place 2 sprays into both nostrils daily., Disp: 16 g, Rfl: 0   furosemide (LASIX) 20 MG tablet, TAKE 1 TABLET BY MOUTH EVERY DAY, Disp: 90 tablet, Rfl: 1   phentermine (ADIPEX-P) 37.5 MG tablet, Take 0.5 tablets by mouth in the morning., Disp: , Rfl:    Vitamin D, Ergocalciferol, (DRISDOL) 1.25 MG (50000 UNIT) CAPS capsule, Take 1 capsule (50,000 Units total) by mouth every 7 (seven) days., Disp: 12 capsule, Rfl: 1  Observations/Objective: Patient is well-developed, well-nourished in no acute distress.  Resting comfortably at home.  Head is normocephalic, atraumatic.  No labored breathing. Speech is clear and coherent with logical content.  Patient is alert and oriented at baseline.   Assessment and Plan: 1. Acute bacterial bronchitis - azithromycin (ZITHROMAX) 250 MG tablet; Take 2 tablets on day 1, then 1 tablet daily on days 2 through 5  Dispense: 6 tablet; Refill: 0 - benzonatate (TESSALON) 100 MG capsule; Take 1 capsule (100 mg total) by mouth 3 (three) times daily as needed for cough.  Dispense: 30 capsule; Refill: 0  Will give Azithromycin per orders. Supportive measures and OTC medications reviewed. Tessalon sent in as well. Will need in-person evaluation if not resolving.   Follow Up Instructions: I discussed the assessment and treatment plan with the patient. The patient was provided an opportunity to ask questions and all were answered. The patient agreed with the plan and demonstrated an understanding of the instructions.  A copy of instructions were sent to the patient via MyChart unless otherwise noted below.   The patient was advised to call back or seek an in-person evaluation if the symptoms worsen or if the condition fails to improve as anticipated.  Time:  I spent 8 minutes with the patient via telehealth technology discussing the above problems/concerns.     Piedad Climes, PA-C

## 2022-10-25 NOTE — Patient Instructions (Signed)
[name], thank you for joining Piedad Climes, PA-C for today's virtual visit.  While this provider is not your primary care provider (PCP), if your PCP is located in our provider database this encounter information will be shared with them immediately following your visit.   A [facility] MyChart account gives you access to today's visit and all your visits, tests, and labs performed at [facility] " click here if you don't have a [facility] MyChart account or go to [email or web address]  Consent: (Patient) Mikayla [name] provided verbal consent for this virtual visit at the beginning of the encounter.  Current Medications:  Current Outpatient Medications:    buPROPion (WELLBUTRIN XL) 150 MG 24 hr tablet, TAKE 1 TABLET BY MOUTH EVERY DAY, Disp: 90 tablet, Rfl: 2   estradiol (ESTRACE) 0.1 MG/GM vaginal cream, Use 1/2 g vaginally two or three times per week as needed to maintain symptom relief., Disp: 42.5 g, Rfl: 2   fluticasone (FLONASE) 50 MCG/ACT nasal spray, Place 2 sprays into both nostrils daily., Disp: 16 g, Rfl: 0   furosemide (LASIX) 20 MG tablet, TAKE 1 TABLET BY MOUTH EVERY DAY, Disp: 90 tablet, Rfl: 1   phentermine (ADIPEX-P) 37.5 MG tablet, Take 0.5 tablets by mouth in the morning., Disp: , Rfl:    Vitamin D, Ergocalciferol, (DRISDOL) 1.25 MG (50000 UNIT) CAPS capsule, Take 1 capsule (50,000 Units total) by mouth every 7 (seven) days., Disp: 12 capsule, Rfl: 1   Medications ordered in this encounter:  No orders of the defined types were placed in this encounter.    *If you need refills on other medications prior to your next appointment, please contact your pharmacy*  Follow-Up: Call back or seek an in-person evaluation if the symptoms worsen or if the condition fails to improve as anticipated.  [facility] [phone]  Other Instructions Take antibiotic (Azithromycin) as directed.  Increase fluids.  Get plenty of rest.  Use Mucinex for congestion. Tessalon as directed for cough. Take a daily probiotic (I recommend Align or Culturelle, but even Activia Yogurt may be beneficial).  A humidifier placed in the bedroom may offer some relief for a dry, scratchy throat of nasal irritation.  Read information below on acute bronchitis. Please call or return to clinic if symptoms are not improving.  Acute Bronchitis Bronchitis is when the airways that extend from the windpipe into the lungs get red, puffy, and painful (inflamed). Bronchitis often causes thick spit (mucus) to develop. This leads to a cough. A cough is the most common symptom of bronchitis. In acute bronchitis, the condition usually begins suddenly and goes away over time (usually in 2 weeks). Smoking, allergies, and asthma can make bronchitis worse. Repeated episodes of bronchitis may cause more lung problems.  HOME CARE Rest. Drink enough fluids to keep your pee (urine) clear or pale yellow (unless you need to limit fluids as told by your doctor). Only take over-the-counter or prescription medicines as told by your doctor. Avoid smoking and secondhand smoke. These can make bronchitis worse. If you are a smoker, think about using nicotine gum or skin patches. Quitting smoking will help your lungs heal faster. Reduce the chance of getting bronchitis again by: Washing your hands often. Avoiding people with cold symptoms. Trying not to touch your hands to your mouth, nose, or eyes. Follow up with your doctor as told.  GET HELP IF: Your symptoms do not improve after 1 week of treatment. Symptoms include: Cough. Fever. Coughing  up thick spit. Body aches. Chest congestion. Chills. Shortness of breath. Sore throat.  GET HELP RIGHT AWAY IF:  You have an increased fever. You have chills. You have severe shortness of breath. You have bloody thick spit (sputum). You throw up (vomit) often. You lose too much body fluid (dehydration). You have a severe  headache. You faint.  MAKE SURE YOU:  Understand these instructions. Will watch your condition. Will get help right away if you are not doing well or get worse. Document Released: 04/23/2008 Document Revised: 07/08/2013 Document Reviewed: 04/28/2013 [facility] Patient Information 2015 [place], [place]. This information is not intended to replace advice given to you by your health care provider. Make sure you discuss any questions you have with your health care provider.    If you have been instructed to have an in-person evaluation today at a local Urgent Care facility, please use the link below. It will take you to a list of all of our available [facility], including address, phone number and hours of operation. Please do not delay care.  [facility]  If you or a family member do not have a primary care provider, use the link below to schedule a visit and establish care. When you choose a [facility] primary care physician or advanced practice provider, you gain a long-term partner in health. Find a Primary Care Provider  Learn more about [facility] in-office and virtual care options: [facility] - Get Care Now

## 2022-12-14 DIAGNOSIS — E785 Hyperlipidemia, unspecified: Secondary | ICD-10-CM | POA: Diagnosis not present

## 2022-12-14 DIAGNOSIS — Z9884 Bariatric surgery status: Secondary | ICD-10-CM | POA: Diagnosis not present

## 2022-12-14 DIAGNOSIS — Z6841 Body Mass Index (BMI) 40.0 and over, adult: Secondary | ICD-10-CM | POA: Diagnosis not present

## 2022-12-17 DIAGNOSIS — E785 Hyperlipidemia, unspecified: Secondary | ICD-10-CM | POA: Diagnosis not present

## 2022-12-17 DIAGNOSIS — Z6841 Body Mass Index (BMI) 40.0 and over, adult: Secondary | ICD-10-CM | POA: Diagnosis not present

## 2022-12-17 DIAGNOSIS — Z9884 Bariatric surgery status: Secondary | ICD-10-CM | POA: Diagnosis not present

## 2022-12-17 DIAGNOSIS — K9089 Other intestinal malabsorption: Secondary | ICD-10-CM | POA: Diagnosis not present

## 2022-12-17 DIAGNOSIS — Z713 Dietary counseling and surveillance: Secondary | ICD-10-CM | POA: Diagnosis not present

## 2022-12-21 ENCOUNTER — Encounter: Payer: Self-pay | Admitting: Family Medicine

## 2022-12-21 ENCOUNTER — Ambulatory Visit: Payer: BC Managed Care – PPO | Admitting: Family Medicine

## 2022-12-21 VITALS — BP 122/76 | HR 70 | Temp 97.8°F | Ht 61.5 in | Wt 222.2 lb

## 2022-12-21 DIAGNOSIS — R21 Rash and other nonspecific skin eruption: Secondary | ICD-10-CM

## 2022-12-21 MED ORDER — TRIAMCINOLONE ACETONIDE 0.1 % EX OINT: 1.0000 | TOPICAL_OINTMENT | Freq: Two times a day (BID) | CUTANEOUS | 1 refills | Status: DC

## 2022-12-21 NOTE — Patient Instructions (Signed)
Follow up as needed or as scheduled APPLY the Triamcinolone ointment twice daily Call with any questions or concerns Stay Safe!  Stay Healthy! CONGRATS!!  You're AMAZING!!!

## 2022-12-21 NOTE — Progress Notes (Signed)
   Subjective:    Patient ID: Mikayla [name], female    DOB: 27-Aug-1966, 57 y.o.   MRN: [id number]  HPI Rash- pt reports first noticed 'a couple of weeks' ago.  Has been using OTC hydrocortisone cream w/ some relief. Only on chest.  Not spreading.  Pt reports if she scratches the area it burns.  Feels irritated when clothes rub.  No one at home w/ similar.  Broke out after returning home from [facility].   Review of Systems For ROS see HPI     Objective:   Physical Exam Vitals reviewed.  Constitutional:      Appearance: Normal appearance.  HENT:     Head: Normocephalic and atraumatic.  Skin:    General: Skin is warm and dry.     Findings: Rash (fine maculopapular rash on anterior chest confined to exposred neckline) present.  Neurological:     General: No focal deficit present.     Mental Status: She is alert and oriented to person, place, and time.  Psychiatric:        Mood and Affect: Mood normal.        Behavior: Behavior normal.        Thought Content: Thought content normal.           Assessment & Plan:  Rash- new.  Appears to be some form of contact dermatitis despite no obvious trigger/exposure.  Start topical Triamcinolone BID.  Reviewed supportive care and red flags that should prompt return.  Pt expressed understanding and is in agreement w/ plan.

## 2023-01-15 ENCOUNTER — Other Ambulatory Visit: Payer: Self-pay

## 2023-01-15 ENCOUNTER — Emergency Department (HOSPITAL_BASED_OUTPATIENT_CLINIC_OR_DEPARTMENT_OTHER)
Admission: EM | Admit: 2023-01-15 | Discharge: 2023-01-15 | Disposition: A | Discharge status: Home / Self Care | Payer: BC Managed Care – PPO | Attending: Emergency Medicine | Admitting: Emergency Medicine

## 2023-01-15 ENCOUNTER — Encounter (HOSPITAL_BASED_OUTPATIENT_CLINIC_OR_DEPARTMENT_OTHER): Payer: Self-pay | Admitting: Emergency Medicine

## 2023-01-15 ENCOUNTER — Other Ambulatory Visit (HOSPITAL_BASED_OUTPATIENT_CLINIC_OR_DEPARTMENT_OTHER): Payer: Self-pay

## 2023-01-15 ENCOUNTER — Emergency Department (HOSPITAL_BASED_OUTPATIENT_CLINIC_OR_DEPARTMENT_OTHER): Payer: BC Managed Care – PPO

## 2023-01-15 DIAGNOSIS — R197 Diarrhea, unspecified: Secondary | ICD-10-CM | POA: Diagnosis not present

## 2023-01-15 DIAGNOSIS — K8020 Calculus of gallbladder without cholecystitis without obstruction: Secondary | ICD-10-CM | POA: Diagnosis not present

## 2023-01-15 DIAGNOSIS — Z1152 Encounter for screening for COVID-19: Secondary | ICD-10-CM | POA: Diagnosis not present

## 2023-01-15 DIAGNOSIS — K529 Noninfective gastroenteritis and colitis, unspecified: Secondary | ICD-10-CM | POA: Diagnosis not present

## 2023-01-15 DIAGNOSIS — K6389 Other specified diseases of intestine: Secondary | ICD-10-CM | POA: Diagnosis not present

## 2023-01-15 DIAGNOSIS — E860 Dehydration: Secondary | ICD-10-CM

## 2023-01-15 LAB — COMPREHENSIVE METABOLIC PANEL
ALT: 14 U/L (ref 0–44)
AST: 14 U/L — ABNORMAL LOW (ref 15–41)
Albumin: 3.7 g/dL (ref 3.5–5.0)
Alkaline Phosphatase: 73 U/L (ref 38–126)
Anion gap: 8 (ref 5–15)
BUN: 15 mg/dL (ref 6–20)
CO2: 25 mmol/L (ref 22–32)
Calcium: 9 mg/dL (ref 8.9–10.3)
Chloride: 103 mmol/L (ref 98–111)
Creatinine, Ser: 0.58 mg/dL (ref 0.44–1.00)
GFR, Estimated: >60 mL/min (ref >60)
Glucose, Bld: 107 mg/dL — ABNORMAL HIGH (ref 70–99)
Potassium: 3.5 mmol/L (ref 3.5–5.1)
Sodium: 136 mmol/L (ref 135–145)
Total Bilirubin: 0.4 mg/dL (ref 0.3–1.2)
Total Protein: 6.7 g/dL (ref 6.5–8.1)

## 2023-01-15 LAB — RESP PANEL BY RT-PCR (RSV, FLU A&B, COVID)  RVPGX2
Influenza A by PCR: NEGATIVE
Influenza B by PCR: NEGATIVE
Resp Syncytial Virus by PCR: NEGATIVE
SARS Coronavirus 2 by RT PCR: NEGATIVE

## 2023-01-15 LAB — CBC WITH DIFFERENTIAL/PLATELET
Abs Immature Granulocytes: 0.02 10*3/uL (ref 0.00–0.07)
Basophils Absolute: 0 10*3/uL (ref 0.0–0.1)
Basophils Relative: 0 %
Eosinophils Absolute: 0 10*3/uL (ref 0.0–0.5)
Eosinophils Relative: 0 %
HCT: 38.1 % (ref 36.0–46.0)
Hemoglobin: 12.3 g/dL (ref 12.0–15.0)
Immature Granulocytes: 0 %
Lymphocytes Relative: 12 %
Lymphs Abs: 0.8 10*3/uL (ref 0.7–4.0)
MCH: 29.8 pg (ref 26.0–34.0)
MCHC: 32.3 g/dL (ref 30.0–36.0)
MCV: 92.3 fL (ref 80.0–100.0)
Monocytes Absolute: 0.4 10*3/uL (ref 0.1–1.0)
Monocytes Relative: 6 %
Neutro Abs: 5.6 10*3/uL (ref 1.7–7.7)
Neutrophils Relative %: 82 %
Platelets: 141 10*3/uL — ABNORMAL LOW (ref 150–400)
RBC: 4.13 MIL/uL (ref 3.87–5.11)
RDW: 13.2 % (ref 11.5–15.5)
WBC: 6.8 10*3/uL (ref 4.0–10.5)
nRBC: 0 % (ref 0.0–0.2)

## 2023-01-15 LAB — MAGNESIUM: Magnesium: 1.7 mg/dL (ref 1.7–2.4)

## 2023-01-15 LAB — LIPASE, BLOOD: Lipase: <10 U/L — ABNORMAL LOW (ref 11–51)

## 2023-01-15 LAB — LACTIC ACID, PLASMA: Lactic Acid, Venous: 0.6 mmol/L (ref 0.5–1.9)

## 2023-01-15 LAB — C DIFFICILE QUICK SCREEN W PCR REFLEX
C Diff antigen: NEGATIVE
C Diff interpretation: NOT DETECTED
C Diff toxin: NEGATIVE

## 2023-01-15 MED ORDER — LACTATED RINGERS IV BOLUS
1000.0000 mL | Freq: Once | INTRAVENOUS | Status: AC
  Administered 2023-01-15: 1000 mL via INTRAVENOUS

## 2023-01-15 MED ORDER — IOHEXOL 300 MG/ML  SOLN
100.0000 mL | Freq: Once | INTRAMUSCULAR | Status: AC | PRN
  Administered 2023-01-15: 100 mL via INTRAVENOUS

## 2023-01-15 MED ORDER — ONDANSETRON 4 MG PO TBDP: 4.0000 mg | ORAL_TABLET | Freq: Three times a day (TID) | ORAL | 0 refills | Status: DC | PRN

## 2023-01-15 MED ORDER — ONDANSETRON HCL 4 MG/2ML IJ SOLN
4.0000 mg | Freq: Once | INTRAMUSCULAR | Status: AC
  Administered 2023-01-15: 4 mg via INTRAVENOUS
  Filled 2023-01-15: qty 2

## 2023-01-15 NOTE — Discharge Instructions (Addendum)
ET scan was negative for bowel obstruction your laboratory evaluation was reassuring.  You likely had mild dehydration from gastroenteritis.  Continue to push electrolyte containing fluids like Gatorade or Pedialyte at home, take Zofran as needed for nausea.  Turn for any worsening symptoms, inability to tolerate oral intake.  Slowly advance her diet over the next few days from clear liquids to bland foods.

## 2023-01-15 NOTE — ED Triage Notes (Signed)
Pt reports diarrhea and fever x 2 days. Pt reports she had gastric bypass 4 months ago.

## 2023-01-15 NOTE — ED Provider Notes (Signed)
[facility] Provider Note   CSN: [id number] Arrival date & time: 01/15/23  1013     History  Chief Complaint  Patient presents with   Diarrhea    Mikayla [name] is a 57 y.o. female.   Diarrhea Associated symptoms: vomiting      57 year old female with medical history significant for obesity, GERD, history of gastric bypass surgery 4 months ago who presents to the emergency department with concern for dehydration in the setting of diarrhea.  The patient states that 3 days ago she developed nausea and vomiting.  The vomiting stopped and she has been tolerating only small sips of liquids due to persistent nausea.  She states that anything she drinks goes straight through her and she has had multiple episodes of watery diarrhea.  No hematochezia.  No hematemesis.  She denies any abdominal pain.  She had mild cramping yesterday.  No fever or chills.  She feels dehydrated and fatigued.  Home Medications Prior to Admission medications   Medication Sig Start Date End Date Taking? Authorizing Provider  ondansetron (ZOFRAN-ODT) 4 MG disintegrating tablet Take 1 tablet (4 mg total) by mouth every 8 (eight) hours as needed. 01/15/23  Yes Regan Lemming, MD  benzonatate (TESSALON) 100 MG capsule Take 1 capsule (100 mg total) by mouth 3 (three) times daily as needed for cough. 10/25/22   Brunetta Jeans, PA-C  buPROPion (WELLBUTRIN XL) 150 MG 24 hr tablet TAKE 1 TABLET BY MOUTH EVERY DAY 05/31/22   Midge Minium, MD  estradiol (ESTRACE) 0.1 MG/GM vaginal cream Use 1/2 g vaginally two or three times per week as needed to maintain symptom relief. 02/14/22   Nunzio Cobbs, MD  fluticasone (FLONASE) 50 MCG/ACT nasal spray Place 2 sprays into both nostrils daily. 06/04/22   Brunetta Jeans, PA-C  furosemide (LASIX) 20 MG tablet TAKE 1 TABLET BY MOUTH EVERY DAY 08/06/22   Midge Minium, MD  phentermine (ADIPEX-P) 37.5 MG tablet Take  0.5 tablets by mouth in the morning. 07/02/22   [provider]  triamcinolone ointment (KENALOG) 0.1 % Apply 1 Application topically 2 (two) times daily. 12/21/22 12/21/23  Midge Minium, MD  Vitamin D, Ergocalciferol, (DRISDOL) 1.25 MG (50000 UNIT) CAPS capsule Take 1 capsule (50,000 Units total) by mouth every 7 (seven) days. 06/12/22   Midge Minium, MD      Allergies    Anesthetics, amide and Codeine    Review of Systems   Review of Systems  Gastrointestinal:  Positive for diarrhea, nausea and vomiting.  All other systems reviewed and are negative.   Physical Exam Updated Vital Signs BP 116/67 (BP Location: Right Arm)   Pulse 86   Temp 98.5 F (36.9 C) (Oral)   Resp 15   SpO2 96%  Physical Exam Vitals and nursing note reviewed.  Constitutional:      General: She is not in acute distress.    Appearance: She is well-developed. She is obese. She is not ill-appearing.  HENT:     Head: Normocephalic and atraumatic.     Mouth/Throat:     Mouth: Mucous membranes are dry.  Eyes:     Conjunctiva/sclera: Conjunctivae normal.  Cardiovascular:     Rate and Rhythm: Normal rate and regular rhythm.  Pulmonary:     Effort: Pulmonary effort is normal. No respiratory distress.     Breath sounds: Normal breath sounds.  Abdominal:     Palpations: Abdomen is  soft.     Tenderness: There is no abdominal tenderness. There is no guarding or rebound.     Hernia: No hernia is present.  Musculoskeletal:        General: No swelling.     Cervical back: Neck supple.  Skin:    General: Skin is warm and dry.     Capillary Refill: Capillary refill takes less than 2 seconds.  Neurological:     Mental Status: She is alert.  Psychiatric:        Mood and Affect: Mood normal.     ED Results / Procedures / Treatments   Labs (all labs ordered are listed, but only abnormal results are displayed) Labs Reviewed  CBC WITH DIFFERENTIAL/PLATELET - Abnormal; Notable for the following  components:      Result Value   Platelets 141 (*)    All other components within normal limits  COMPREHENSIVE METABOLIC PANEL - Abnormal; Notable for the following components:   Glucose, Bld 107 (*)    AST 14 (*)    All other components within normal limits  LIPASE, BLOOD - Abnormal; Notable for the following components:   Lipase <10 (*)    All other components within normal limits  RESP PANEL BY RT-PCR (RSV, FLU A&B, COVID)  RVPGX2  GASTROINTESTINAL PANEL BY PCR, STOOL (REPLACES STOOL CULTURE)  C DIFFICILE QUICK SCREEN W PCR REFLEX    LACTIC ACID, PLASMA  MAGNESIUM    EKG None  Radiology CT ABDOMEN PELVIS W CONTRAST  Result Date: 01/15/2023 CLINICAL DATA:  Fever and diarrhea for the past 2 days. History of gastric bypass surgery 4 months ago. EXAM: CT ABDOMEN AND PELVIS WITH CONTRAST TECHNIQUE: Multidetector CT imaging of the abdomen and pelvis was performed using the standard protocol following bolus administration of intravenous contrast. RADIATION DOSE REDUCTION: This exam was performed according to the departmental dose-optimization program which includes automated exposure control, adjustment of the mA and/or kV according to patient size and/or use of iterative reconstruction technique. CONTRAST:  123m OMNIPAQUE IOHEXOL 300 MG/ML  SOLN COMPARISON:  CT abdomen pelvis dated May 06, 2015. FINDINGS: Lower chest: No acute abnormality. Hepatobiliary: No focal liver abnormality. Mildly distended gallbladder with small gallstone. No gallbladder wall thickening or biliary dilatation. Pancreas: Unremarkable. No pancreatic ductal dilatation or surrounding inflammatory changes. Spleen: Normal in size without focal abnormality. Adrenals/Urinary Tract: Adrenal glands are unremarkable. Kidneys are normal, without renal calculi, focal lesion, or hydronephrosis. Bladder is decompressed. Stomach/Bowel: Circumferential wall thickening of the distal/terminal ileum, cecum, ascending colon, and proximal  transverse colon. No obstruction. 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 Normal diminutive appendix. Vascular/Lymphatic: No significant vascular findings are present. No enlarged abdominal or pelvic lymph nodes. Reproductive: Status post hysterectomy. No adnexal masses. Other: Unchanged tiny fat containing umbilical hernia. No free fluid or pneumoperitoneum. Musculoskeletal: No acute or significant osseous findings. IMPRESSION: 1. Circumferential wall thickening of the distal/terminal ileum and right colon, consistent with infectious or inflammatory enterocolitis. No obstruction. 2. Cholelithiasis. Electronically Signed   By: WTitus DubinM.D.   On: 01/15/2023 12:53    Procedures Procedures    Medications Ordered in ED Medications  lactated ringers bolus 1,000 mL (1,000 mLs Intravenous New Bag/Given 01/15/23 1110)  ondansetron (ZOFRAN) injection 4 mg (4 mg Intravenous Given 01/15/23 1111)  iohexol (OMNIPAQUE) 300 MG/ML solution 100 mL (100 mLs Intravenous Contrast Given 01/15/23 1229)    ED Course/ Medical Decision Making/ A&P  Medical Decision Making Amount and/or Complexity of Data Reviewed Labs: ordered. Radiology: ordered.  Risk Prescription drug management.    57 year old female with medical history significant for obesity, GERD, history of gastric bypass surgery 4 months ago who presents to the emergency department with concern for dehydration in the setting of diarrhea.  The patient states that 3 days ago she developed nausea and vomiting.  The vomiting stopped and she has been tolerating only small sips of liquids due to persistent nausea.  She states that anything she drinks goes straight through her and she has had multiple episodes of watery diarrhea.  No hematochezia.  No hematemesis.  She denies any abdominal pain.  She had mild cramping yesterday.  No fever or chills.  She feels dehydrated and fatigued.  On arrival, the  patient was vitally stable, afebrile, not tachycardic or tachypneic, normotensive.  Sinus rhythm noted on cardiac telemetry.  Physical exam significant for dry mucous membranes, abdomen was overall soft, nontender, nondistended.  No rebound or guarding.  Differential diagnosis includes likely gastroenteritis.  The patient does state that she is not passing gas although she is having episodes of loose stools.  She does have a history of gastric bypass surgery.  She endorsed mild cramping yesterday but denies any active abdominal pain.  [place] diagnosis includes gastroenteritis, C. difficile infection, COVID-19, influenza, bowel obstruction to be less likely, diverticulitis.  After discussion with the patient, she would prefer to undergo CT imaging to rule out intra-abdominal abnormality.  IV access was obtained and the patient was administered IV Zofran and IV fluid bolus.  Laboratory evaluation significant for CBC without a leukocytosis or anemia, CMP unremarkable, lactic acid normal, magnesium normal, lipase normal, COVID-19, influenza, RSV PCR testing collected and negative.  GIP testing and C. difficile PCR testing was collected and pending.  The patient is informed that these test would be a sign out and she would need to follow-up on the patient portal regarding these test results.  CT abdomen pelvis was formed and results were negative for bowel obstruction: IMPRESSION:  1. Circumferential wall thickening of the distal/terminal ileum and  right colon, consistent with infectious or inflammatory  enterocolitis. No obstruction.  2. Cholelithiasis.   On repeat assessment, the patient was overall well-appearing, tolerating oral intake.  Overall stable for discharge with continued outpatient management.  Prescription for Zofran provided.  Final Clinical Impression(s) / ED Diagnoses Final diagnoses:  Gastroenteritis  Dehydration    Rx / DC Orders ED Discharge Orders          Ordered     ondansetron (ZOFRAN-ODT) 4 MG disintegrating tablet  Every 8 hours PRN        01/15/23 1308              Regan Lemming, MD 01/15/23 1309

## 2023-01-15 NOTE — ED Notes (Signed)
Pt given discharge instructions and reviewed prescriptions. Opportunities given for questions. Pt verbalizes understanding.  PIV removed x1. Leanne Chang, RN

## 2023-01-16 ENCOUNTER — Telehealth: Payer: BC Managed Care – PPO | Admitting: Family Medicine

## 2023-01-16 DIAGNOSIS — A045 Campylobacter enteritis: Secondary | ICD-10-CM

## 2023-01-16 LAB — GASTROINTESTINAL PANEL BY PCR, STOOL (REPLACES STOOL CULTURE)

## 2023-01-16 MED ORDER — AZITHROMYCIN 500 MG PO TABS: 500.0000 mg | ORAL_TABLET | Freq: Every day | ORAL | 0 refills | Status: AC

## 2023-01-16 NOTE — Patient Instructions (Addendum)
Dorris Singh, thank you for joining Perlie Mayo, NP for today's virtual visit.  While this provider is not your primary care provider (PCP), if your PCP is located in our provider database this encounter information will be shared with them immediately following your visit.   [facility] account gives you access to today's visit and all your visits, tests, and labs performed at [facility] " click here if you don't have a [facility] account or go to [email or web address]  Consent: (Patient) Mikayla [name] provided verbal consent for this virtual visit at the beginning of the encounter.  Current Medications:  Current Outpatient Medications:    azithromycin (ZITHROMAX) 500 MG tablet, Take 1 tablet (500 mg total) by mouth daily for 5 days., Disp: 5 tablet, Rfl: 0   benzonatate (TESSALON) 100 MG capsule, Take 1 capsule (100 mg total) by mouth 3 (three) times daily as needed for cough., Disp: 30 capsule, Rfl: 0   buPROPion (WELLBUTRIN XL) 150 MG 24 hr tablet, TAKE 1 TABLET BY MOUTH EVERY DAY, Disp: 90 tablet, Rfl: 2   estradiol (ESTRACE) 0.1 MG/GM vaginal cream, Use 1/2 g vaginally two or three times per week as needed to maintain symptom relief., Disp: 42.5 g, Rfl: 2   fluticasone (FLONASE) 50 MCG/ACT nasal spray, Place 2 sprays into both nostrils daily., Disp: 16 g, Rfl: 0   furosemide (LASIX) 20 MG tablet, TAKE 1 TABLET BY MOUTH EVERY DAY, Disp: 90 tablet, Rfl: 1   ondansetron (ZOFRAN-ODT) 4 MG disintegrating tablet, Take 1 tablet (4 mg total) by mouth every 8 (eight) hours as needed., Disp: 20 tablet, Rfl: 0   phentermine (ADIPEX-P) 37.5 MG tablet, Take 0.5 tablets by mouth in the morning., Disp: , Rfl:    triamcinolone ointment (KENALOG) 0.1 %, Apply 1 Application topically 2 (two) times daily., Disp: 90 g, Rfl: 1   Vitamin D, Ergocalciferol, (DRISDOL) 1.25 MG (50000 UNIT) CAPS capsule, Take 1 capsule (50,000 Units total) by mouth every 7 (seven)  days., Disp: 12 capsule, Rfl: 1   Medications ordered in this encounter:  Meds ordered this encounter  Medications   azithromycin (ZITHROMAX) 500 MG tablet    Sig: Take 1 tablet (500 mg total) by mouth daily for 5 days.    Dispense:  5 tablet    Refill:  0    Order Specific Question:   Supervising Provider    Answer:   Chase Picket [id number]     *If you need refills on other medications prior to your next appointment, please contact your pharmacy*  Follow-Up: Call back or seek an in-person evaluation if the symptoms worsen or if the condition fails to improve as anticipated.  [place] [phone]  Other Instructions  Campylobacter Gastroenteritis Campylobacter gastroenteritis is a common infection of the stomach and intestines that can cause diarrhea and other symptoms. It is caused by Campylobacter bacteria. These bacteria often infect animals, and the condition spreads easily to other animals and humans through food and water that contain the bacteria (are contaminated). Campylobacter gastroenteritis is also known as traveler's diarrhea, and it is more common in countries where food and water are contaminated. In very rare cases, a campylobacter infection may lead to another condition called Guillain-Barr syndrome (GBS). This condition can occur when the body's disease-fighting system (immune system) overreacts after an infection. Signs of GBS include a progressive weakness or inability to move (paralysis) that is usually symmetrical, or found on both sides of  the body. Weakness may last for weeks or even years. What are the causes? This condition is caused by Campylobacter bacteria. You may get this infection if you: Drink water that is contaminated by stool (feces) of an infected animal or person. Eat raw or undercooked meat from an infected animal. Eat raw or undercooked fruits or vegetables that have come in contact with the stool or meat of an infected  animal. Drink milk that has not been heated enough to kill bacteria (not pasteurized). Touch anything that is contaminated, then touch your mouth before you have washed your hands. What increases the risk? People at higher risk of infection include: Young children. Aging adults. People with a weak immune system, such as people who have AIDS (acquired immunodeficiency syndrome), cancer, or other long-term (chronic) diseases. Pregnant women. What are the signs or symptoms? Symptoms of this condition include: Watery diarrhea, which may be bloody. Pain in the abdomen. Cramps. Fever. Nausea and vomiting. Muscle aches or headache. Symptoms usually start 2-5 days after infection occurs. Symptoms may be more severe in people who have a weak immune system. Healthy people may have the infection without showing any symptoms. How is this diagnosed? This condition may be diagnosed based on: Your symptoms. Recent history of travel to a place where water contamination is common. A physical exam. Testing a sample of stool or body fluid to check for bacteria. How is this treated? In most cases, this condition goes away without treatment within 7 days. Your health care provider may: Recommend an oral rehydration solution (ORS). This is a drink that helps you replace fluids and electrolytes. It is found at pharmacies and retail stores. Prescribe medicines to relieve nausea, vomiting, or diarrhea. Prescribe antibiotic medicine, if you have a weak immune system or severe symptoms. Give you IV fluids at the hospital to prevent dehydration, if you cannot drink enough to replace fluids that you lost because of severe vomiting or diarrhea. Follow these instructions at home: Medicines Take over-the-counter and prescription medicines only as told by your health care provider. Do not take medicines to help with diarrhea. These medicines can make the infection worse. If you were prescribed an antibiotic, take  it as told by your health care provider. Do not stop taking the antibiotic even if you start to feel better. Eating and drinking  Drink clear fluids as you are able. Clear fluids include water, ice chips, diluted fruit juice, and low-calorie sports drinks. Eat small meals throughout the day that include healthy foods such as whole grains, lean meats, and fruits and vegetables. Drink enough fluid to keep your urine pale yellow. This is especially important if you are vomiting or have diarrhea. Avoid fluids that contain a lot of sugar or caffeine, such as energy drinks, soda, and some sports drinks. Avoid alcohol. General instructions Rest at home until your symptoms go away. Return to your normal activities as told by your health care provider. Wash your hands often with soap and hot water for at least 20 seconds. If soap and water are not available, use hand sanitizer. Keep all follow-up visits. This is important. How is this prevented?  Use a meat thermometer to make sure you cook meat to the recommended temperature. Always wash fruits and vegetables before eating or preparing them. Do not drink unpasteurized (raw) milk. Do not let food come in contact with raw meat or any juice from raw meat. After you prepare raw meat, wash your hands, countertops, cutting boards, and all utensils  with hot water and soap. If you use a cloth dishtowel, do not use it again until you have washed it. Wash your hands with hot water and soap after going to the bathroom, changing a diaper, or coming in contact with pet feces. When traveling in a country where contamination is common: Do not eat any uncooked foods. Drink only bottled water. Contact a health care provider if: Your symptoms last more than 7 days. Your symptoms get worse. You have a fever. Get help right away if: You cannot drink fluids without vomiting. You have trouble breathing. You develop weakness or paralysis. You have symptoms of  dehydration, such as: Dry skin. Thirst. Decreased or dark urine. Tiredness or confusion. These symptoms may represent a serious problem that is an emergency. Do not wait to see if the symptoms will go away. Get medical help right away. Call your local emergency services (911 in the [place] Do not drive yourself to the hospital. Summary Campylobacter gastroenteritis is a common infection that can cause diarrhea and other symptoms. It is caused by bacteria. Campylobacter gastroenteritis is also known as traveler's diarrhea, and it is more common in countries where food and water are contaminated. In most cases, this condition goes away without treatment within 7 days. Get help right away if you cannot drink fluids without vomiting, you have trouble breathing, you have symptoms of dehydration, or you develop weakness or paralysis. This information is not intended to replace advice given to you by your health care provider. Make sure you discuss any questions you have with your health care provider. Document Revised: 02/28/2021 Document Reviewed: 02/28/2021 Elsevier Patient Education  [place].     If you have been instructed to have an in-person evaluation today at a local Urgent Care facility, please use the link below. It will take you to a list of all of our available [facility], including address, phone number and hours of operation. Please do not delay care.  [facility]  If you or a family member do not have a primary care provider, use the link below to schedule a visit and establish care. When you choose a [facility] primary care physician or advanced practice provider, you gain a long-term partner in health. Find a Primary Care Provider  Learn more about [facility] in-office and virtual care options: [facility]

## 2023-01-16 NOTE — Progress Notes (Signed)
Virtual Visit Consent   Mikayla [name], you are scheduled for a virtual visit with a [facility] provider today. Just as with appointments in the office, your consent must be obtained to participate. Your consent will be active for this visit and any virtual visit you may have with one of our providers in the next 365 days. If you have a MyChart account, a copy of this consent can be sent to you electronically.  As this is a virtual visit, video technology does not allow for your provider to perform a traditional examination. This may limit your provider's ability to fully assess your condition. If your provider identifies any concerns that need to be evaluated in person or the need to arrange testing (such as labs, EKG, etc.), we will make arrangements to do so. Although advances in technology are sophisticated, we cannot ensure that it will always work on either your end or our end. If the connection with a video visit is poor, the visit may have to be switched to a telephone visit. With either a video or telephone visit, we are not always able to ensure that we have a secure connection.  By engaging in this virtual visit, you consent to the provision of healthcare and authorize for your insurance to be billed (if applicable) for the services provided during this visit. Depending on your insurance coverage, you may receive a charge related to this service.  I need to obtain your verbal consent now. Are you willing to proceed with your visit today? Mikayla [name] has provided verbal consent on 01/16/2023 for a virtual visit (video or telephone). Mikayla Mayo, NP  Date: 01/16/2023 3:01 PM  Virtual Visit via Video Note   I, Mikayla [name], connected with  Mikayla [name]  ([id number], 19-Jun-1966) on 01/16/23 at  2:45 PM EST by a video-enabled telemedicine application and verified that I am speaking with the correct person using two identifiers.  Location: Patient: Virtual Visit Location  Patient: Home Provider: Virtual Visit Location Provider: Home Office   I discussed the limitations of evaluation and management by telemedicine and the availability of in person appointments. The patient expressed understanding and agreed to proceed.    History of Present Illness: Mikayla [name] is a 57 y.o. who identifies as a female who was assigned female at birth, and is being seen today for on going diarrhea, nausea, and fatigue. History of gastric bypass surgery 4 months ago. Reports today due to on going diarrhea. Was seen in ED yesterday. 5 days ago she started with fevers and chills. 4 days ago she developed n/v and diarrhea-watery. She is unable to tolerate anything more than a small sip of fluid after zofran use. Once she drinks it goes straight through her. Denies blood in emesis or stool, abdominal pain- asided from mild cramps 2 days ago. No recent fever or chills. No recent travel or ingestion of undercooked food that she is aware of. She was dehydrated at ED and received IVF and zofran IV. She improved. CT scan normal. Labs unremarkable,. COVID, FLu, RSV PCR neg. C Diff neg. GIP showed campylobacter + today.  Dx with gastroenteritis and once stable sent home.   Extensive review of ED notes and results/testing.     Problems:  Patient Active Problem List   Diagnosis Date Noted   Urinary incontinence 07/04/2022   GERD 07/04/2022   Colon polyps 07/04/2022   Back pain 07/04/2022   Anxiety 07/04/2022   Adjustment disorder with  anxiety 07/04/2022   Family history of early CAD 05/10/2022   Depression 05/10/2022   Rectal bleeding 02/16/2022   Abnormal weight gain 02/16/2022   Physical exam 12/25/2021   Leg swelling 03/15/2021   Positive ANA (antinuclear antibody) 10/19/2020   Abnormal lung sounds 10/19/2020   Degenerative disc disease, lumbar 05/11/2019   Insulin resistance 02/19/2017   Dyshidrotic eczema 09/05/2012   Vitamin D deficiency 08/13/2012   Morbid obesity  ([place]) 04/11/2010    Allergies:  Allergies  Allergen Reactions   Anesthetics, Amide    Codeine Itching and Other (See Comments)   Medications:  Current Outpatient Medications:    azithromycin (ZITHROMAX) 500 MG tablet, Take 1 tablet (500 mg total) by mouth daily for 5 days., Disp: 5 tablet, Rfl: 0   benzonatate (TESSALON) 100 MG capsule, Take 1 capsule (100 mg total) by mouth 3 (three) times daily as needed for cough., Disp: 30 capsule, Rfl: 0   buPROPion (WELLBUTRIN XL) 150 MG 24 hr tablet, TAKE 1 TABLET BY MOUTH EVERY DAY, Disp: 90 tablet, Rfl: 2   estradiol (ESTRACE) 0.1 MG/GM vaginal cream, Use 1/2 g vaginally two or three times per week as needed to maintain symptom relief., Disp: 42.5 g, Rfl: 2   fluticasone (FLONASE) 50 MCG/ACT nasal spray, Place 2 sprays into both nostrils daily., Disp: 16 g, Rfl: 0   furosemide (LASIX) 20 MG tablet, TAKE 1 TABLET BY MOUTH EVERY DAY, Disp: 90 tablet, Rfl: 1   ondansetron (ZOFRAN-ODT) 4 MG disintegrating tablet, Take 1 tablet (4 mg total) by mouth every 8 (eight) hours as needed., Disp: 20 tablet, Rfl: 0   phentermine (ADIPEX-P) 37.5 MG tablet, Take 0.5 tablets by mouth in the morning., Disp: , Rfl:    triamcinolone ointment (KENALOG) 0.1 %, Apply 1 Application topically 2 (two) times daily., Disp: 90 g, Rfl: 1   Vitamin D, Ergocalciferol, (DRISDOL) 1.25 MG (50000 UNIT) CAPS capsule, Take 1 capsule (50,000 Units total) by mouth every 7 (seven) days., Disp: 12 capsule, Rfl: 1  Observations/Objective: Patient is well-developed, well-nourished in no acute distress.  Resting comfortably  at home.  Head is normocephalic, atraumatic.  No labored breathing.  Speech is clear and coherent with logical content.  Patient is alert and oriented at baseline.    Assessment and Plan: 1. Campylobacter gastroenteritis - azithromycin (ZITHROMAX) 500 MG tablet; Take 1 tablet (500 mg total) by mouth daily for 5 days.  Dispense: 5 tablet; Refill: 0  -given the  fevers and chills in the start with worsening and not improving diarrhea with OTC and zofran for n/v will start above given clear + results from stool sample  -info about campylobacter discussed and on AVS -low threshold for in person care if fluids are not tolerated in next 24-48 hours with meds   Reviewed side effects, risks and benefits of medication.    Patient acknowledged agreement and understanding of the plan.   Past Medical, Surgical, Social History, Allergies, and Medications have been Reviewed.   Follow Up Instructions: I discussed the assessment and treatment plan with the patient. The patient was provided an opportunity to ask questions and all were answered. The patient agreed with the plan and demonstrated an understanding of the instructions.  A copy of instructions were sent to the patient via MyChart unless otherwise noted below.    The patient was advised to call back or seek an in-person evaluation if the symptoms worsen or if the condition fails to improve as anticipated.  Time:  I  spent 25 minutes with the patient via telehealth technology discussing the above problems/concerns.    Mikayla Mayo, NP

## 2023-01-16 NOTE — ED Notes (Signed)
I recently received a phone call from [facility] and was given the information that she was found to have camphylobacter. I attempted to call pt. At her ONLY listed number in our system [phone] and received the recording that this "is not a working number".

## 2023-01-18 ENCOUNTER — Telehealth: Payer: Self-pay

## 2023-01-18 NOTE — Patient Outreach (Signed)
  Care Coordination   Initial Visit Note   01/18/2023 Name: Thania Dewaele MRN: [id number] DOB: Mar 10, 1966  Lexius Bex is a 57 y.o. year old female who sees Tabori, Aundra Millet, MD for primary care. I spoke with  Dorris Singh by phone today.  What matters to the patients health and wellness today?  Patient was in the ED on 01/15/23 for "gastroenteritis and dehydration." She states she was not feeling better after discharge. She had a virtual MD visit on 01/16/23 and provider stated her on abx therapy. She voices since then she has been feeling better and sxs improving. Diarrhea is "slowing down." She was able to eat small serving of food this morning and no n&v. Patient feels like she is getting better. She denies any RN CM needs or concerns at this time.      SDOH assessments and interventions completed:  Yes  SDOH Interventions Today    Flowsheet Row Most Recent Value  SDOH Interventions   Food Insecurity Interventions Intervention Not Indicated  Transportation Interventions Intervention Not Indicated        Care Coordination Interventions:  Yes, provided  Interventions Today    Flowsheet Row Most Recent Value  General Interventions   General Interventions Discussed/Reviewed General Interventions Discussed  Nutrition Interventions   Nutrition Discussed/Reviewed Nutrition Discussed, Fluid intake  Pharmacy Interventions   Pharmacy Dicussed/Reviewed Pharmacy Topics Discussed, Medications and their functions       Follow up plan: No further intervention required.   Encounter Outcome:  Pt. Visit Completed    [facility] Care Management Care Management Community Coordinator Direct Phone: [phone] Toll Free: [phone] Fax: [phone]

## 2023-01-23 ENCOUNTER — Encounter: Payer: BC Managed Care – PPO | Admitting: Family Medicine

## 2023-02-25 ENCOUNTER — Encounter: Payer: Self-pay | Admitting: Family Medicine

## 2023-02-25 ENCOUNTER — Ambulatory Visit: Payer: BC Managed Care – PPO | Admitting: Family Medicine

## 2023-02-25 VITALS — BP 112/78 | HR 86 | Temp 97.7°F | Resp 17 | Ht 61.5 in | Wt 205.4 lb

## 2023-02-25 DIAGNOSIS — Z1152 Encounter for screening for COVID-19: Secondary | ICD-10-CM | POA: Diagnosis not present

## 2023-02-25 DIAGNOSIS — B9689 Other specified bacterial agents as the cause of diseases classified elsewhere: Secondary | ICD-10-CM

## 2023-02-25 DIAGNOSIS — J019 Acute sinusitis, unspecified: Secondary | ICD-10-CM

## 2023-02-25 LAB — POC COVID19 BINAXNOW: SARS Coronavirus 2 Ag: NEGATIVE

## 2023-02-25 MED ORDER — AMOXICILLIN 875 MG PO TABS: 875.0000 mg | ORAL_TABLET | Freq: Two times a day (BID) | ORAL | 0 refills | Status: AC

## 2023-02-25 NOTE — Patient Instructions (Signed)
Follow up as needed or as scheduled START the Amoxicillin twice daily- take w/ food ADD a daily Claritin or Zyrtec to help w/ the allergy component Drink LOTS of fluids Tylenol or ibuprofen as needed for headache/sore throat REST! Continue Robitussin or Delsym or Mucinex DM for cough/congestion Call with any questions or concerns Hang in there!

## 2023-02-25 NOTE — Progress Notes (Signed)
   Subjective:    Patient ID: Mikayla [name], female    DOB: 01-Aug-1966, 57 y.o.   MRN: [id number]  HPI URI- pt reports sxs started 7-8 days ago.  Initially had fever but this resolved within 1-2 days.  + congestion, cough.  Has used DayQuil, NyQuil, mucinex, robitussin w/o relief.  Now having drainage from eyes and they are itching and burning.  + facial pain.  + HA.  Has not been using any allergy medication.  Pt reports very sore throat that started this morning.  + decreased appetite.  Itchy ears but no pain.   Review of Systems For ROS see HPI     Objective:   Physical Exam Vitals reviewed.  Constitutional:      General: She is not in acute distress.    Appearance: Normal appearance. She is well-developed. She is not ill-appearing.  HENT:     Head: Normocephalic and atraumatic.     Right Ear: Tympanic membrane normal.     Left Ear: Tympanic membrane normal.     Nose: Mucosal edema and congestion present. No rhinorrhea.     Right Sinus: Maxillary sinus tenderness and frontal sinus tenderness present.     Left Sinus: Maxillary sinus tenderness and frontal sinus tenderness present.     Mouth/Throat:     Pharynx: Uvula midline. Posterior oropharyngeal erythema (w/ PND) present. No oropharyngeal exudate.  Eyes:     Extraocular Movements: Extraocular movements intact.     Conjunctiva/sclera: Conjunctivae normal.     Pupils: Pupils are equal, round, and reactive to light.  Cardiovascular:     Rate and Rhythm: Normal rate and regular rhythm.     Heart sounds: Normal heart sounds.  Pulmonary:     Effort: Pulmonary effort is normal. No respiratory distress.     Breath sounds: Normal breath sounds. No wheezing.  Musculoskeletal:     Cervical back: Normal range of motion and neck supple.  Lymphadenopathy:     Cervical: No cervical adenopathy.  Skin:    General: Skin is warm and dry.  Neurological:     General: No focal deficit present.     Mental Status: She is alert and  oriented to person, place, and time.     Cranial Nerves: No cranial nerve deficit.     Motor: No weakness.     Coordination: Coordination normal.  Psychiatric:        Mood and Affect: Mood normal.        Behavior: Behavior normal.        Thought Content: Thought content normal.           Assessment & Plan:  Bacterial sinusitis- new.  Suspect this started as a viral/allergy combo and due to the inflammation of sinuses, now has a bacterial infxn.  Start Amoxicillin 875mg  BID.  Encouraged daily use of antihistamines.  Reviewed supportive care and red flags that should prompt return.  Pt expressed understanding and is in agreement w/ plan.

## 2023-03-06 ENCOUNTER — Other Ambulatory Visit: Payer: Self-pay | Admitting: Family Medicine

## 2023-03-06 DIAGNOSIS — Z1231 Encounter for screening mammogram for malignant neoplasm of breast: Secondary | ICD-10-CM
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
## 2023-03-08 DIAGNOSIS — Z6837 Body mass index (BMI) 37.0-37.9, adult: Secondary | ICD-10-CM | POA: Diagnosis not present

## 2023-03-08 DIAGNOSIS — Z9884 Bariatric surgery status: Secondary | ICD-10-CM | POA: Diagnosis not present

## 2023-03-08 DIAGNOSIS — E6609 Other obesity due to excess calories: Secondary | ICD-10-CM | POA: Diagnosis not present

## 2023-03-12 ENCOUNTER — Ambulatory Visit
Admission: RE | Admit: 2023-03-12 | Discharge: 2023-03-12 | Disposition: A | Discharge status: Home / Self Care | Payer: BC Managed Care – PPO | Source: Ambulatory Visit | Attending: Family Medicine | Admitting: Family Medicine

## 2023-03-12 DIAGNOSIS — Z6837 Body mass index (BMI) 37.0-37.9, adult: Secondary | ICD-10-CM | POA: Diagnosis not present

## 2023-03-12 DIAGNOSIS — Z9884 Bariatric surgery status: Secondary | ICD-10-CM | POA: Diagnosis not present

## 2023-03-12 DIAGNOSIS — Z1231 Encounter for screening mammogram for malignant neoplasm of breast: Secondary | ICD-10-CM

## 2023-03-12 DIAGNOSIS — E6609 Other obesity due to excess calories: Secondary | ICD-10-CM | POA: Diagnosis not present

## 2023-03-17 ENCOUNTER — Other Ambulatory Visit: Payer: Self-pay | Admitting: Family Medicine

## 2023-03-21 ENCOUNTER — Ambulatory Visit (INDEPENDENT_AMBULATORY_CARE_PROVIDER_SITE_OTHER): Payer: BC Managed Care – PPO | Admitting: Obstetrics and Gynecology

## 2023-03-21 ENCOUNTER — Encounter: Payer: Self-pay | Admitting: Obstetrics and Gynecology

## 2023-03-21 ENCOUNTER — Other Ambulatory Visit (HOSPITAL_COMMUNITY)
Admission: RE | Admit: 2023-03-21 | Discharge: 2023-03-21 | Disposition: A | Discharge status: Home / Self Care | Payer: BC Managed Care – PPO | Source: Ambulatory Visit | Attending: Obstetrics and Gynecology | Admitting: Obstetrics and Gynecology

## 2023-03-21 VITALS — BP 124/86 | HR 79 | Ht 61.5 in | Wt 204.0 lb

## 2023-03-21 DIAGNOSIS — Z01419 Encounter for gynecological examination (general) (routine) without abnormal findings: Secondary | ICD-10-CM

## 2023-03-21 DIAGNOSIS — R6882 Decreased libido: Secondary | ICD-10-CM

## 2023-03-21 DIAGNOSIS — N890 Mild vaginal dysplasia: Secondary | ICD-10-CM | POA: Diagnosis not present

## 2023-03-21 DIAGNOSIS — R300 Dysuria: Secondary | ICD-10-CM | POA: Diagnosis not present

## 2023-03-21 DIAGNOSIS — N760 Acute vaginitis: Secondary | ICD-10-CM

## 2023-03-21 DIAGNOSIS — Z79890 Hormone replacement therapy (postmenopausal): Secondary | ICD-10-CM | POA: Diagnosis not present

## 2023-03-21 DIAGNOSIS — N952 Postmenopausal atrophic vaginitis: Secondary | ICD-10-CM

## 2023-03-21 DIAGNOSIS — N951 Menopausal and female climacteric states: Secondary | ICD-10-CM | POA: Diagnosis not present

## 2023-03-21 LAB — WET PREP FOR TRICH, YEAST, CLUE

## 2023-03-21 MED ORDER — FLUCONAZOLE 150 MG PO TABS: 150.0000 mg | ORAL_TABLET | Freq: Once | ORAL | 0 refills | Status: AC

## 2023-03-21 MED ORDER — ESTRADIOL 0.1 MG/GM VA CREA: TOPICAL_CREAM | VAGINAL | 2 refills | Status: DC

## 2023-03-21 MED ORDER — SULFAMETHOXAZOLE-TRIMETHOPRIM 800-160 MG PO TABS: 1.0000 | ORAL_TABLET | Freq: Two times a day (BID) | ORAL | 0 refills | Status: DC

## 2023-03-21 MED ORDER — NYSTATIN 100000 UNIT/GM EX POWD: 1.0000 | Freq: Three times a day (TID) | CUTANEOUS | 2 refills | Status: DC

## 2023-03-21 NOTE — Patient Instructions (Signed)

## 2023-03-24 LAB — URINE CULTURE
MICRO NUMBER:: 14904731
SPECIMEN QUALITY:: ADEQUATE

## 2023-03-24 LAB — URINALYSIS, COMPLETE W/RFL CULTURE
Bilirubin Urine: NEGATIVE
Glucose, UA: NEGATIVE
Hyaline Cast: NONE SEEN /LPF
Ketones, ur: NEGATIVE
Nitrites, Initial: NEGATIVE
Protein, ur: NEGATIVE
Specific Gravity, Urine: 1.02 (ref 1.001–1.035)
pH: 5.5 (ref 5.0–8.0)

## 2023-03-24 LAB — CULTURE INDICATED

## 2023-03-25 LAB — TESTOS,TOTAL,FREE AND SHBG (FEMALE)
Free Testosterone: 1.1 pg/mL (ref 0.1–6.4)
Sex Hormone Binding: 60 nmol/L (ref 14–73)
Testosterone, Total, LC-MS-MS: 13 ng/dL (ref 2–45)

## 2023-03-27 ENCOUNTER — Encounter: Payer: Self-pay | Admitting: Obstetrics and Gynecology

## 2023-03-27 DIAGNOSIS — L821 Other seborrheic keratosis: Secondary | ICD-10-CM | POA: Diagnosis not present

## 2023-03-27 DIAGNOSIS — L918 Other hypertrophic disorders of the skin: Secondary | ICD-10-CM | POA: Diagnosis not present

## 2023-04-01 LAB — CYTOLOGY - PAP
Comment: NEGATIVE
Diagnosis: UNDETERMINED — AB
High risk HPV: NEGATIVE

## 2023-05-27 ENCOUNTER — Telehealth: Payer: BC Managed Care – PPO | Admitting: Physician Assistant

## 2023-05-27 DIAGNOSIS — R0781 Pleurodynia: Secondary | ICD-10-CM

## 2023-05-27 NOTE — Patient Instructions (Signed)
Mikayla [name], thank you for joining Mikayla Loveless, PA-C for today's virtual visit.  While this provider is not your primary care provider (PCP), if your PCP is located in our provider database this encounter information will be shared with them immediately following your visit.   A [place] MyChart account gives you access to today's visit and all your visits, tests, and labs performed at [facility] " click here if you don't have a [place] MyChart account or go to [email or web address]  Consent: (Patient) Mikayla [name] provided verbal consent for this virtual visit at the beginning of the encounter.  Current Medications:  Current Outpatient Medications:    buPROPion (WELLBUTRIN XL) 150 MG 24 hr tablet, TAKE 1 TABLET BY MOUTH EVERY DAY, Disp: 90 tablet, Rfl: 2   estradiol (ESTRACE) 0.1 MG/GM vaginal cream, Use 1/2 g vaginally every night for the first 2 weeks, then use 1/2 g vaginally two or three times per week as needed to maintain symptom relief., Disp: 42.5 g, Rfl: 2   fluticasone (FLONASE) 50 MCG/ACT nasal spray, Place 2 sprays into both nostrils daily., Disp: 16 g, Rfl: 0   nystatin (MYCOSTATIN/NYSTOP) powder, Apply 1 Application topically 3 (three) times daily. Apply to affected area for up to 7 days, Disp: 30 g, Rfl: 2   sulfamethoxazole-trimethoprim (BACTRIM DS) 800-160 MG tablet, Take 1 tablet by mouth 2 (two) times daily. One PO BID x 3 days, Disp: 6 tablet, Rfl: 0   triamcinolone ointment (KENALOG) 0.1 %, Apply 1 Application topically 2 (two) times daily., Disp: 90 g, Rfl: 1   Medications ordered in this encounter:  No orders of the defined types were placed in this encounter.    *If you need refills on other medications prior to your next appointment, please contact your pharmacy*  Follow-Up: Call back or seek an in-person evaluation if the symptoms worsen or if the condition fails to improve as anticipated.  [facility]  [phone]  Other Instructions  Rib Contusion A rib contusion is a deep bruise on the rib area. Contusions are the result of a blunt trauma that causes bleeding and injury to the tissues under the skin. A rib contusion may involve bruising of the ribs and of the skin and muscles in the area. The skin over the contusion may turn blue, purple, or yellow. Minor injuries result in a painless contusion. More severe contusions may be painful and swollen for a few weeks. What are the causes? This condition is usually caused by a hard, direct hit to an area of the body. This often occurs while playing contact sports. What are the signs or symptoms? Symptoms of this condition include: Swelling and redness of the injured area. Discoloration of the injured area. Tenderness and soreness of the injured area. Pain with or without movement. Pain when breathing in. How is this diagnosed? This condition may be diagnosed based on: Your symptoms and medical history. A physical exam. Imaging tests--such as an X-ray, CT scan, or MRI--to determine if there were internal injuries or broken bones (fractures). How is this treated? This condition may be treated with: Rest. This is often the best treatment for a rib contusion. Ice packs. This reduces swelling and inflammation. Deep-breathing exercises. These may be recommended to reduce the risk for lung collapse and pneumonia. Medicines. Over-the-counter or prescription medicines may be given to control pain. Injection of a numbing medicine around the nerve near your injury (nerve block). Follow these instructions at home:  Medicines Take over-the-counter and prescription medicines only as told by your health care provider. Ask your health care provider if the medicine prescribed to you: Requires you to avoid driving or using machinery. Can cause constipation. You may need to take these actions to prevent or treat constipation: Drink enough fluid to keep  your urine pale yellow. Take over-the-counter or prescription medicines. Eat foods that are high in fiber, such as beans, whole grains, and fresh fruits and vegetables. Limit foods that are high in fat and processed sugars, such as fried or sweet foods. Managing pain, stiffness, and swelling If directed, put ice on the injured area. To do this: Put ice in a plastic bag. Place a towel between your skin and the bag. Leave the ice on for 20 minutes, 2-3 times a day. Remove the ice if your skin turns bright red. This is very important. If you cannot feel pain, heat, or cold, you have a greater risk of damage to the area.  Activity Rest the injured area. Avoid strenuous activity and any activities or movements that cause pain. Be careful during activities, and avoid bumping the injured area. Do not lift anything that is heavier than 5 lb (2.3 kg), or the limit that you are told, until your health care provider says that it is safe. General instructions  Do not use any products that contain nicotine or tobacco, such as cigarettes, e-cigarettes, and chewing tobacco. These can delay healing. If you need help quitting, ask your health care provider. Do deep-breathing exercises as told by your health care provider. If you were given an incentive spirometer, use it every 1-2 hours while you are awake, or as recommended by your health care provider. This device measures how well you are filling your lungs with each breath. Keep all follow-up visits. This is important. Contact a health care provider if you have: Increased bruising or swelling. Pain that is not controlled with treatment. A fever. Get help right away if you: Have difficulty breathing or shortness of breath. Develop a continual cough, or you cough up thick or bloody mucus from your lungs (sputum). Feel nauseous or you vomit. Have pain in your abdomen. These symptoms may represent a serious problem that is an emergency. Do not wait to  see if the symptoms will go away. Get medical help right away. Call your local emergency services (911 in the [place] Do not drive yourself to the hospital. Summary A rib contusion is a deep bruise on your rib area. Contusions are the result of a blunt trauma that causes bleeding and injury to the tissues under the skin. The skin over the contusion may turn blue, purple, or yellow. Minor injuries may cause a painless contusion. More severe contusions may be painful and swollen for a few weeks. Rest the injured area. Avoid strenuous activity and any activities or movements that cause pain. This information is not intended to replace advice given to you by your health care provider. Make sure you discuss any questions you have with your health care provider. Document Revised: 02/10/2020 Document Reviewed: 02/10/2020 Elsevier Patient Education  2024 Elsevier Inc.    If you have been instructed to have an in-person evaluation today at a local Urgent Care facility, please use the link below. It will take you to a list of all of our available [facility], including address, phone number and hours of operation. Please do not delay care.  [facility]  If you or a family  member do not have a primary care provider, use the link below to schedule a visit and establish care. When you choose a [facility] primary care physician or advanced practice provider, you gain a long-term partner in health. Find a Primary Care Provider  Learn more about [facility]'s in-office and virtual care options: [facility] [place] - Get Care Now

## 2023-05-27 NOTE — Progress Notes (Signed)
Virtual Visit Consent   Mikayla [name], you are scheduled for a virtual visit with a [facility] provider today. Just as with appointments in the office, your consent must be obtained to participate. Your consent will be active for this visit and any virtual visit you may have with one of our providers in the next 365 days. If you have a MyChart account, a copy of this consent can be sent to you electronically.  As this is a virtual visit, video technology does not allow for your provider to perform a traditional examination. This may limit your provider's ability to fully assess your condition. If your provider identifies any concerns that need to be evaluated in person or the need to arrange testing (such as labs, EKG, etc.), we will make arrangements to do so. Although advances in technology are sophisticated, we cannot ensure that it will always work on either your end or our end. If the connection with a video visit is poor, the visit may have to be switched to a telephone visit. With either a video or telephone visit, we are not always able to ensure that we have a secure connection.  By engaging in this virtual visit, you consent to the provision of healthcare and authorize for your insurance to be billed (if applicable) for the services provided during this visit. Depending on your insurance coverage, you may receive a charge related to this service.  I need to obtain your verbal consent now. Are you willing to proceed with your visit today? Mikayla [name] has provided verbal consent on 05/27/2023 for a virtual visit (video or telephone). [name], PA-C  Date: 05/27/2023 7:54 PM  Virtual Visit via Video Note   I, [name], connected with  Mikayla [name]  ([id number], [id number]) on 05/27/23 at  7:30 PM EDT by a video-enabled telemedicine application and verified that I am speaking with the correct person using two identifiers.  Location: Patient: Virtual Visit  Location Patient: Home Provider: Virtual Visit Location Provider: Home Office   I discussed the limitations of evaluation and management by telemedicine and the availability of in person appointments. The patient expressed understanding and agreed to proceed.    History of Present Illness: Mikayla [name] is a 57 y.o. who identifies as a female who was assigned female at birth, and is being seen today for rib pain. Pain is located under the right breast area. Happened last night doing an activity. Felt a crack and had pain intensely start after. No deformity felt. No SOB or difficulty breathing.    Problems:  Patient Active Problem List   Diagnosis Date Noted   Urinary incontinence 07/04/2022   GERD 07/04/2022   Colon polyps 07/04/2022   Back pain 07/04/2022   Anxiety 07/04/2022   Adjustment disorder with anxiety 07/04/2022   Family history of early CAD 05/10/2022   Depression 05/10/2022   Rectal bleeding 02/16/2022   Abnormal weight gain 02/16/2022   Physical exam 12/25/2021   Positive ANA (antinuclear antibody) 10/19/2020   Abnormal lung sounds 10/19/2020   Degenerative disc disease, lumbar 05/11/2019   Insulin resistance 02/19/2017   Dyshidrotic eczema 09/05/2012   Vitamin D deficiency 08/13/2012    Allergies:  Allergies  Allergen Reactions   Anesthetics, Amide    Codeine Itching and Other (See Comments)   Medications:  Current Outpatient Medications:    buPROPion (WELLBUTRIN XL) 150 MG 24 hr tablet, TAKE 1 TABLET BY MOUTH EVERY DAY, Disp: 90 tablet, Rfl:  2   estradiol (ESTRACE) 0.1 MG/GM vaginal cream, Use 1/2 g vaginally every night for the first 2 weeks, then use 1/2 g vaginally two or three times per week as needed to maintain symptom relief., Disp: 42.5 g, Rfl: 2   fluticasone (FLONASE) 50 MCG/ACT nasal spray, Place 2 sprays into both nostrils daily., Disp: 16 g, Rfl: 0   nystatin (MYCOSTATIN/NYSTOP) powder, Apply 1 Application topically 3 (three) times daily. Apply  to affected area for up to 7 days, Disp: 30 g, Rfl: 2   sulfamethoxazole-trimethoprim (BACTRIM DS) 800-160 MG tablet, Take 1 tablet by mouth 2 (two) times daily. One PO BID x 3 days, Disp: 6 tablet, Rfl: 0   triamcinolone ointment (KENALOG) 0.1 %, Apply 1 Application topically 2 (two) times daily., Disp: 90 g, Rfl: 1  Observations/Objective: Patient is well-developed, well-nourished in no acute distress.  Resting comfortably at home.  Head is normocephalic, atraumatic.  No labored breathing.  Speech is clear and coherent with logical content.  Patient is alert and oriented at baseline.    Assessment and Plan: 1. Rib pain  - Conservative management with tylenol and Ibuprofen alternating - Ice for next couple of days then transition to heat - Bracing with certain aggravating movements - Discussed deep breathing/coughing exercises - Seek in person evaluation if not improving over next 2 weeks or worsening  Follow Up Instructions: I discussed the assessment and treatment plan with the patient. The patient was provided an opportunity to ask questions and all were answered. The patient agreed with the plan and demonstrated an understanding of the instructions.  A copy of instructions were sent to the patient via MyChart unless otherwise noted below.    The patient was advised to call back or seek an in-person evaluation if the symptoms worsen or if the condition fails to improve as anticipated.  Time:  I spent 10 minutes with the patient via telehealth technology discussing the above problems/concerns.    [name], PA-C

## 2023-06-26 ENCOUNTER — Telehealth: Payer: Self-pay | Admitting: Cardiology

## 2023-06-26 DIAGNOSIS — I77810 Thoracic aortic ectasia: Secondary | ICD-10-CM

## 2023-06-26 NOTE — Telephone Encounter (Signed)
Pt calling to setup the CT test. Please advise.

## 2023-06-26 NOTE — Telephone Encounter (Signed)
Left message for pt to call back and Mychart message was sent to patient.

## 2023-06-27 ENCOUNTER — Telehealth (HOSPITAL_BASED_OUTPATIENT_CLINIC_OR_DEPARTMENT_OTHER): Payer: Self-pay | Admitting: Cardiology

## 2023-06-27 NOTE — Telephone Encounter (Signed)
Spoke with patient regarding the 07/04/23 4:630 pm CTA chest/aorta scheduled at [facility].  Arrival time is 4:15 pm at the 1st floor radiology department for check in --patient to come in next week (eith Monday or Tuesday for lab work)  She voiced her understanding

## 2023-07-01 DIAGNOSIS — I77810 Thoracic aortic ectasia: Secondary | ICD-10-CM | POA: Diagnosis not present

## 2023-07-04 ENCOUNTER — Encounter (HOSPITAL_COMMUNITY): Payer: Self-pay

## 2023-07-04 ENCOUNTER — Ambulatory Visit (HOSPITAL_COMMUNITY)
Admission: RE | Admit: 2023-07-04 | Discharge: 2023-07-04 | Disposition: A | Discharge status: Home / Self Care | Payer: BC Managed Care – PPO | Source: Ambulatory Visit | Attending: Cardiology | Admitting: Cardiology

## 2023-07-04 DIAGNOSIS — I7121 Aneurysm of the ascending aorta, without rupture: Secondary | ICD-10-CM | POA: Diagnosis not present

## 2023-07-04 DIAGNOSIS — I77810 Thoracic aortic ectasia: Secondary | ICD-10-CM | POA: Diagnosis not present

## 2023-07-04 DIAGNOSIS — K449 Diaphragmatic hernia without obstruction or gangrene: Secondary | ICD-10-CM | POA: Diagnosis not present

## 2023-07-04 DIAGNOSIS — K8020 Calculus of gallbladder without cholecystitis without obstruction: Secondary | ICD-10-CM | POA: Diagnosis not present

## 2023-07-04 DIAGNOSIS — I517 Cardiomegaly: Secondary | ICD-10-CM | POA: Diagnosis not present

## 2023-07-04 MED ORDER — IOHEXOL 350 MG/ML SOLN
75.0000 mL | Freq: Once | INTRAVENOUS | Status: AC | PRN
  Administered 2023-07-04: 75 mL via INTRAVENOUS

## 2023-07-09 ENCOUNTER — Encounter (HOSPITAL_BASED_OUTPATIENT_CLINIC_OR_DEPARTMENT_OTHER): Payer: Self-pay | Admitting: Cardiology

## 2023-07-09 ENCOUNTER — Ambulatory Visit (HOSPITAL_BASED_OUTPATIENT_CLINIC_OR_DEPARTMENT_OTHER): Payer: BC Managed Care – PPO | Admitting: Cardiology

## 2023-07-09 VITALS — BP 114/62 | HR 75 | Ht 61.5 in | Wt 200.5 lb

## 2023-07-09 DIAGNOSIS — E7800 Pure hypercholesterolemia, unspecified: Secondary | ICD-10-CM

## 2023-07-09 DIAGNOSIS — I77810 Thoracic aortic ectasia: Secondary | ICD-10-CM

## 2023-07-09 DIAGNOSIS — I700 Atherosclerosis of aorta: Secondary | ICD-10-CM

## 2023-07-09 DIAGNOSIS — Z7189 Other specified counseling: Secondary | ICD-10-CM | POA: Diagnosis not present

## 2023-07-09 DIAGNOSIS — Z8249 Family history of ischemic heart disease and other diseases of the circulatory system: Secondary | ICD-10-CM | POA: Diagnosis not present

## 2023-07-09 MED ORDER — ASPIRIN 81 MG PO TBEC: 81.0000 mg | DELAYED_RELEASE_TABLET | Freq: Every day | ORAL | Status: DC

## 2023-07-09 MED ORDER — ROSUVASTATIN CALCIUM 5 MG PO TABS: 5.0000 mg | ORAL_TABLET | Freq: Every day | ORAL | 3 refills | Status: DC

## 2023-07-09 NOTE — Patient Instructions (Signed)
Medication Instructions:  START ASPIRIN 81 MG DAILY   START ROSUVASTATIN 5 MG DAILY   *If you need a refill on your cardiac medications before your next appointment, please call your pharmacy*  Lab Work: AT YOUR PRIMARY CARE AS SCHEDULED   Testing/Procedures: Your physician has requested that you have an echocardiogram. Echocardiography is a painless test that uses sound waves to create images of your heart. It provides your doctor with information about the size and shape of your heart and how well your heart's chambers and valves are working. This procedure takes approximately one hour. There are no restrictions for this procedure. Please do NOT wear cologne, perfume, aftershave, or lotions (deodorant is allowed). Please arrive /15 minutes prior to your appointment time.  TO BE DONE 06/2024  Follow-Up: At [facility], you and your health needs are our priority.  As part of our continuing mission to provide you with exceptional heart care, we have created designated Provider Care Teams.  These Care Teams include your primary Cardiologist (physician) and Advanced Practice Providers (APPs -  Physician Assistants and Nurse Practitioners) who all work together to provide you with the care you need, when you need it.  We recommend signing up for the patient portal called "MyChart".  Sign up information is provided on this After Visit Summary.  MyChart is used to connect with patients for Virtual Visits (Telemedicine).  Patients are able to view lab/test results, encounter notes, upcoming appointments, etc.  Non-urgent messages can be sent to your provider as well.   To learn more about what you can do with MyChart, go to [email or web address].    Your next appointment:   After Echo in 12 month(s)  The format for your next appointment:   In Person  Provider:   Jodelle Red, MD OR Ronn Melena NP

## 2023-07-09 NOTE — Progress Notes (Signed)
Cardiology Office Note:  .    Date:  07/09/2023  ID:  Mikayla [name], DOB 07-06-1966, MRN [id number] PCP: Sheliah Hatch, MD  [facility] Providers Cardiologist:  Jodelle Red, MD     History of Present Illness: .    Mikayla [name] is a 57 y.o. female with a hx of aortic atherosclerosis, ascending aortic aneurysm,  hyperlipidemia, prediabetes, GERD, and morbid obesity, who is seen for follow-up today. She was initially seen 07/10/2022 as a new consult at the request of Beverely Low, Helane Rima, MD for the evaluation and management of family history of early CAD.   She saw her PCP Dr. Beverely Low on 05/09/2022 where she was noted to be morbidly obese, and have a history of insulin resistance and hyperlipidemia. She reported a family history of cardiovascular disease in her parents. Her mother died of MI at 7 yo. Given these risk factors she was referred to cardiology for a complete evaluation. She was also started on Wellbutrin.   Cardiovascular risk factors: Prior clinical ASCVD: none Metabolic syndrome/comorbid conditions: Hyperlipidemia - At one time she confirms having high cholesterol, but did not go on medication for this. She reports a prior diagnosis of prediabetes but reports that A1C levels are decreased. Her latest A1C was 5.9. History of obesity Chronic inflammatory conditions: none Tobacco use history: Never. However, she endorses second-hand smoke due to her mother who was a smoker. Family history: Mother died at 89 y/o from heart disease. Mother also had history of diabetes and hyperlipidemia. Her mother had several stents placed prior to CABG. Father passed from MI at 23 y/o. Maternal side has a history of heart disease in her aunt and uncle. She has several siblings who are currently in good health. Exercise level: Very limited on activity for 2.5 years due to sciatic nerve pain; now she is able to be more active. Reports she has lost 19 pounds. She reports some  SOB on exertion (particularly with climbing stairs) but is not sure if this may be due to deconditioning. She reports that she is scheduled next month to be tested for sleep apnea in order to be cleared for weight loss surgery. She reports occasional peripheral edema when traveling or after she is on her feet all day. She will take lasix when needed.  Current diet: low carb diet   At her initial visit, we decided to proceed with CT calcium scoring 06/2022 which revealed a coronary calcium score of 0 and scattered aortic atherosclerosis. There was also mildly dilated caliber of ascending aorta, estimated at 41 mm. Subsequent chest/aorta CTA 07/04/2023 revealed ascending aortic dilatation measuring 4.2 cm. Calcium score remained stable at 0.    Today, she wished to discuss her CTA results; reviewed in detail. Discussed incidental findings including small hiatal hernia. She denies any issues with acid reflux.  Sometimes she may feel a mild flutter. Her blood pressure has been well controlled at home, and is 114/62 in the office.  Since her last visit, she underwent Roux-en-Y gastric bypass. Her weight in clinic today is 200 lbs; she was 270 at her last visit. We reviewed diet recommendations from a cardiovascular perspective.  Recently went on a trip and noticed that she didn't develop any leg swelling despite prolonged walking.  She denies any chest pain, shortness of breath, lightheadedness, headaches, syncope, orthopnea, or PND.  ROS:  Please see the history of present illness. ROS otherwise negative except as noted.  (+) Occasional mild palpitations  Studies Reviewed: .  CTA Chest/Aorta  07/04/2023: IMPRESSION: 1. Aneurysmal dilatation of the ascending aorta measuring 4.2 cm. Recommend annual imaging followup by CTA or MRA. This recommendation follows 2010 ACCF/AHA/AATS/ACR/ASA/SCA/SCAI/SIR/STS/SVM Guidelines for the Diagnosis and Management of Patients with Thoracic Aortic Disease.  Circulation. 2010; 121: Z610-R604. Aortic aneurysm NOS (ICD10-I71.9) 2. Hazy ground-glass attenuation in the lungs bilaterally which may be secondary to air trapping or edema. 3. Cardiomegaly. 4. Small hiatal hernia. 5. Cholelithiasis.  CT Cardiac Scoring  07/11/2022: IMPRESSION: Coronary calcium score of 0. This was 0 percentile for age-, race-, and sex-matched controls. Mildly dilated caliber of ascending aorta, estimated at 41 mm. Scattered aortic atherosclerosis noted.  Physical Exam:    VS:  BP 114/62 (BP Location: Right Arm, Patient Position: Sitting, Cuff Size: Large)   Pulse 75   Ht 5' 1.5" (1.562 m)   Wt 200 lb 8 oz (90.9 kg)   SpO2 97%   BMI 37.27 kg/m    Wt Readings from Last 3 Encounters:  07/09/23 200 lb 8 oz (90.9 kg)  03/21/23 204 lb (92.5 kg)  02/25/23 205 lb 6 oz (93.2 kg)    GEN: Well nourished, well developed in no acute distress HEENT: Normal, moist mucous membranes NECK: No JVD CARDIAC: regular rhythm, normal S1 and S2, no rubs or gallops. No murmur. VASCULAR: Radial and DP pulses 2+ bilaterally. No carotid bruits RESPIRATORY:  Clear to auscultation without rales, wheezing or rhonchi  ABDOMEN: Soft, non-tender, non-distended MUSCULOSKELETAL:  Ambulates independently SKIN: Warm and dry, no edema NEUROLOGIC:  Alert and oriented x 3. No focal neuro deficits noted. PSYCHIATRIC:  Normal affect   ASSESSMENT AND PLAN: .    Family history of early CV disease Aortic atherosclerosis CV risk factors: BMI 50-->37, hypercholesterolemia -calcium score of 0, but there was scattered aortic atherosclerosis noted -we reviewed options at length. Reviewed data, guidelines. After shared decision making, will start low dose aspirin and statin -she is due for follow up in 2-3 months with her PCP with labs. Can recheck lipids at that time, or if not checked we can order for her  Thoracic aortic aneurysm -stable on CT. Recheck by echo in 1 year.   Cardiac risk  counseling and prevention recommendations: -recommend heart healthy/Mediterranean diet, with whole grains, fruits, vegetable, fish, lean meats, nuts, and olive oil. Limit salt. -recommend moderate walking, 3-5 times/week for 30-50 minutes each session. Aim for at least 150 minutes.week. Goal should be pace of 3 miles/hours, or walking 1.5 miles in 30 minutes -recommend avoidance of tobacco products. Avoid excess alcohol. -ASCVD risk score: The 10-year ASCVD risk score (Arnett DK, et al., 2019) is: 2.2%   Values used to calculate the score:     Age: 48 years     Sex: Female     Is Non-Hispanic African American: No     Diabetic: No     Tobacco smoker: No     Systolic Blood Pressure: 114 mmHg     Is BP treated: No     HDL Cholesterol: 54 mg/dL     Total Cholesterol: 178 mg/dL    Dispo: Follow-up in 1 year, or sooner as needed.  I,Mathew Stumpf,acting as a Neurosurgeon for [employer], MD.,have documented all relevant documentation on the behalf of Jodelle Red, MD,as directed by  Jodelle Red, MD while in the presence of Jodelle Red, MD.  I, Jodelle Red, MD, have reviewed all documentation for this visit. The documentation on 07/09/23 for the exam, diagnosis, procedures, and orders are all accurate and complete.  Signed, Jodelle Red, MD

## 2023-07-13 ENCOUNTER — Ambulatory Visit (HOSPITAL_BASED_OUTPATIENT_CLINIC_OR_DEPARTMENT_OTHER): Payer: BC Managed Care – PPO

## 2023-08-16 ENCOUNTER — Encounter: Payer: Self-pay | Admitting: Family Medicine

## 2023-08-16 ENCOUNTER — Ambulatory Visit (INDEPENDENT_AMBULATORY_CARE_PROVIDER_SITE_OTHER): Payer: BC Managed Care – PPO | Admitting: Family Medicine

## 2023-08-16 VITALS — BP 110/64 | HR 64 | Temp 98.0°F | Ht 62.0 in | Wt 194.6 lb

## 2023-08-16 DIAGNOSIS — Z0000 Encounter for general adult medical examination without abnormal findings: Secondary | ICD-10-CM | POA: Diagnosis not present

## 2023-08-16 DIAGNOSIS — E559 Vitamin D deficiency, unspecified: Secondary | ICD-10-CM

## 2023-08-16 DIAGNOSIS — E669 Obesity, unspecified: Secondary | ICD-10-CM

## 2023-08-16 LAB — LIPID PANEL
Cholesterol: 136 mg/dL (ref 0–200)
HDL: 53.4 mg/dL (ref >39.00)
LDL Cholesterol: 60 mg/dL (ref 0–99)
NonHDL: 82.61
Total CHOL/HDL Ratio: 3
Triglycerides: 111 mg/dL (ref 0.0–149.0)
VLDL: 22.2 mg/dL (ref 0.0–40.0)

## 2023-08-16 LAB — CBC WITH DIFFERENTIAL/PLATELET
Basophils Absolute: 0 10*3/uL (ref 0.0–0.1)
Basophils Relative: 0.3 % (ref 0.0–3.0)
Eosinophils Absolute: 0.2 10*3/uL (ref 0.0–0.7)
Eosinophils Relative: 3.6 % (ref 0.0–5.0)
HCT: 38.4 % (ref 36.0–46.0)
Hemoglobin: 12.5 g/dL (ref 12.0–15.0)
Lymphocytes Relative: 30.9 % (ref 12.0–46.0)
Lymphs Abs: 1.9 10*3/uL (ref 0.7–4.0)
MCHC: 32.4 g/dL (ref 30.0–36.0)
MCV: 93.5 fL (ref 78.0–100.0)
Monocytes Absolute: 0.4 10*3/uL (ref 0.1–1.0)
Monocytes Relative: 7.3 % (ref 3.0–12.0)
Neutro Abs: 3.5 10*3/uL (ref 1.4–7.7)
Neutrophils Relative %: 57.9 % (ref 43.0–77.0)
Platelets: 149 10*3/uL — ABNORMAL LOW (ref 150.0–400.0)
RBC: 4.11 Mil/uL (ref 3.87–5.11)
RDW: 13.2 % (ref 11.5–15.5)
WBC: 6 10*3/uL (ref 4.0–10.5)

## 2023-08-16 LAB — HEPATIC FUNCTION PANEL
ALT: 39 U/L — ABNORMAL HIGH (ref 0–35)
AST: 28 U/L (ref 0–37)
Albumin: 3.9 g/dL (ref 3.5–5.2)
Alkaline Phosphatase: 99 U/L (ref 39–117)
Bilirubin, Direct: 0.1 mg/dL (ref 0.0–0.3)
Total Bilirubin: 0.3 mg/dL (ref 0.2–1.2)
Total Protein: 6.4 g/dL (ref 6.0–8.3)

## 2023-08-16 LAB — BASIC METABOLIC PANEL
BUN: 17 mg/dL (ref 6–23)
CO2: 28 meq/L (ref 19–32)
Calcium: 8.9 mg/dL (ref 8.4–10.5)
Chloride: 105 meq/L (ref 96–112)
Creatinine, Ser: 0.52 mg/dL (ref 0.40–1.20)
GFR: 101.85 mL/min (ref >60.00)
Glucose, Bld: 92 mg/dL (ref 70–99)
Potassium: 4.3 meq/L (ref 3.5–5.1)
Sodium: 141 meq/L (ref 135–145)

## 2023-08-16 LAB — VITAMIN D 25 HYDROXY (VIT D DEFICIENCY, FRACTURES): VITD: 34.22 ng/mL (ref 30.00–100.00)

## 2023-08-16 LAB — TSH: TSH: 1.51 u[IU]/mL (ref 0.35–5.50)

## 2023-08-16 MED ORDER — BUPROPION HCL ER (XL) 300 MG PO TB24: 300.0000 mg | ORAL_TABLET | Freq: Every day | ORAL | 1 refills | Status: DC

## 2023-08-16 NOTE — Patient Instructions (Addendum)
Follow up in 1 year or sooner if needed We'll notify you of your lab results and make any changes if needed Continue to work on healthy diet and regular exercise- you're doing great!! Increase the Wellbutrin to 300mg  daily Try OTC Unisom for sleep Plan on a flu shot at some point in the next 2 weeks Call with any questions or concerns Stay Safe!  Stay Healthy! Happy Fall!!

## 2023-08-16 NOTE — Progress Notes (Signed)
   Subjective:    Patient ID: Mikayla [name], female    DOB: 1966/09/11, 57 y.o.   MRN: [id number]  HPI CPE- UTD on mammo, colonoscopy.  No need for pap (hysterectomy)  Patient Care Team    Relationship Specialty Notifications Start End  Sheliah Hatch, MD PCP - General Family Medicine  03/03/21   Jodelle Red, MD PCP - Cardiology Cardiology  07/10/22   Donzetta Starch, MD Consulting Physician Dermatology  11/02/16   Charna Elizabeth, MD Consulting Physician Gastroenterology  11/02/16   Specialists, [facility] Consulting Physician Orthopedic Surgery  12/15/18    Comment: Dr. Bridgett Larsson, MD Consulting Physician Obstetrics and Gynecology  03/21/23      Health Maintenance  Topic Date Due   INFLUENZA VACCINE  02/17/2024 (Originally 06/20/2023)   MAMMOGRAM  03/11/2024   Colonoscopy  11/03/2024   Hepatitis C Screening  Completed   HIV Screening  Completed   HPV VACCINES  Aged Out   DTaP/Tdap/Td  Discontinued   COVID-19 Vaccine  Discontinued   Zoster Vaccines- Shingrix  Discontinued      Review of Systems Patient reports no vision/ hearing changes, adenopathy,fever,  persistant/recurrent hoarseness , swallowing issues, chest pain, palpitations, edema, persistant/recurrent cough, hemoptysis, dyspnea (rest/exertional/paroxysmal nocturnal), gastrointestinal bleeding (melena, rectal bleeding), abdominal pain, significant heartburn, bowel changes, GU symptoms (dysuria, hematuria, incontinence), Gyn symptoms (abnormal  bleeding, pain),  syncope, focal weakness, memory loss, numbness & tingling, skin/hair/nail changes, abnormal bruising or bleeding, anxiety, or depression.   + 10 lb weight loss    Objective:   Physical Exam General Appearance:    Alert, cooperative, no distress, appears stated age, obese  Head:    Normocephalic, without obvious abnormality, atraumatic  Eyes:    PERRL, conjunctiva/corneas clear, EOM's intact both eyes  Ears:     Normal TM's and external ear canals, both ears  Nose:   Nares normal, septum midline, mucosa normal, no drainage    or sinus tenderness  Throat:   Lips, mucosa, and tongue normal; teeth and gums normal  Neck:   Supple, symmetrical, trachea midline, no adenopathy;    Thyroid: no enlargement/tenderness/nodules  Back:     Symmetric, no curvature, ROM normal, no CVA tenderness  Lungs:     Clear to auscultation bilaterally, respirations unlabored  Chest Wall:    No tenderness or deformity   Heart:    Regular rate and rhythm, S1 and S2 normal, no murmur, rub   or gallop  Breast Exam:    Deferred to GYN  Abdomen:     Soft, non-tender, bowel sounds active all four quadrants,    no masses, no organomegaly  Genitalia:    Deferred to GYN  Rectal:    Extremities:   Extremities normal, atraumatic, no cyanosis or edema  Pulses:   2+ and symmetric all extremities  Skin:   Skin color, texture, turgor normal, no rashes or lesions  Lymph nodes:   Cervical, supraclavicular, and axillary nodes normal  Neurologic:   CNII-XII intact, normal strength, sensation and reflexes    throughout          Assessment & Plan:

## 2023-08-16 NOTE — Assessment & Plan Note (Signed)
Improving.  Down another 10 lbs and over 100 overall since surgery.  Check labs to risk stratify.  Will follow.

## 2023-08-16 NOTE — Assessment & Plan Note (Signed)
Pt's PE WNL w/ exception of obesity.  That being said, she is down over 100 lbs since surgery.  UTD on mammo, colonoscopy.  Plans to get flu later.  Check labs.  Anticipatory guidance provided.

## 2023-08-19 ENCOUNTER — Telehealth: Payer: Self-pay

## 2023-08-19 NOTE — Telephone Encounter (Signed)
-----   Message from Neena Rhymes sent at 08/19/2023  7:24 AM EDT ----- Labs look GREAT!  Cholesterol is SO much better!! Glucose (sugar) is so much better!  No changes at this time

## 2023-09-20 DIAGNOSIS — Z6835 Body mass index (BMI) 35.0-35.9, adult: Secondary | ICD-10-CM | POA: Diagnosis not present

## 2023-09-20 DIAGNOSIS — E66812 Obesity, class 2: Secondary | ICD-10-CM | POA: Diagnosis not present

## 2023-09-20 DIAGNOSIS — E6609 Other obesity due to excess calories: Secondary | ICD-10-CM | POA: Diagnosis not present

## 2023-09-20 DIAGNOSIS — Z9884 Bariatric surgery status: Secondary | ICD-10-CM | POA: Diagnosis not present

## 2023-09-26 DIAGNOSIS — Z9884 Bariatric surgery status: Secondary | ICD-10-CM | POA: Diagnosis not present

## 2023-09-26 DIAGNOSIS — K219 Gastro-esophageal reflux disease without esophagitis: Secondary | ICD-10-CM | POA: Diagnosis not present

## 2023-09-26 DIAGNOSIS — G4733 Obstructive sleep apnea (adult) (pediatric): Secondary | ICD-10-CM | POA: Diagnosis not present

## 2023-09-27 ENCOUNTER — Telehealth: Payer: BC Managed Care – PPO | Admitting: Physician Assistant

## 2023-09-27 DIAGNOSIS — H60392 Other infective otitis externa, left ear: Secondary | ICD-10-CM

## 2023-09-27 DIAGNOSIS — S3141XA Laceration without foreign body of vagina and vulva, initial encounter: Secondary | ICD-10-CM

## 2023-09-27 MED ORDER — CIPROFLOXACIN-DEXAMETHASONE 0.3-0.1 % OT SUSP
4.0000 [drp] | Freq: Two times a day (BID) | OTIC | 0 refills | Status: DC
Start: 2023-09-27 — End: 2024-02-17

## 2023-09-27 MED ORDER — MUPIROCIN 2 % EX OINT
1.0000 | TOPICAL_OINTMENT | Freq: Two times a day (BID) | CUTANEOUS | 0 refills | Status: DC
Start: 2023-09-27 — End: 2024-02-17

## 2023-09-27 NOTE — Progress Notes (Signed)
Virtual Visit Consent   Mikayla [name], you are scheduled for a virtual visit with a [facility] provider today. Just as with appointments in the office, your consent must be obtained to participate. Your consent will be active for this visit and any virtual visit you may have with one of our providers in the next 365 days. If you have a MyChart account, a copy of this consent can be sent to you electronically.  As this is a virtual visit, video technology does not allow for your provider to perform a traditional examination. This may limit your provider's ability to fully assess your condition. If your provider identifies any concerns that need to be evaluated in person or the need to arrange testing (such as labs, EKG, etc.), we will make arrangements to do so. Although advances in technology are sophisticated, we cannot ensure that it will always work on either your end or our end. If the connection with a video visit is poor, the visit may have to be switched to a telephone visit. With either a video or telephone visit, we are not always able to ensure that we have a secure connection.  By engaging in this virtual visit, you consent to the provision of healthcare and authorize for your insurance to be billed (if applicable) for the services provided during this visit. Depending on your insurance coverage, you may receive a charge related to this service.  I need to obtain your verbal consent now. Are you willing to proceed with your visit today? Mikayla [name] has provided verbal consent on 09/27/2023 for a virtual visit (video or telephone). Mikayla Loveless, PA-C  Date: 09/27/2023 8:09 AM  Virtual Visit via Video Note   I, Mikayla [name], connected with  Mikayla [name]  ([id number], Sep 04, 1966) on 09/27/23 at  8:00 AM EST by a video-enabled telemedicine application and verified that I am speaking with the correct person using two identifiers.  Location: Patient: Virtual Visit  Location Patient: Home Provider: Virtual Visit Location Provider: Home Office   I discussed the limitations of evaluation and management by telemedicine and the availability of in person appointments. The patient expressed understanding and agreed to proceed.    History of Present Illness: Mikayla [name] is a 57 y.o. who identifies as a female who was assigned female at birth, and is being seen today for ear pain.  HPI: Otalgia  There is pain in the left ear. This is a new problem. The current episode started yesterday. The problem occurs constantly. The problem has been gradually worsening. There has been no fever. Associated symptoms include hearing loss (feels muffled). Pertinent negatives include no coughing, ear discharge, headaches, neck pain, rhinorrhea or sore throat. Associated symptoms comments: chills. She has tried acetaminophen for the symptoms. The treatment provided no relief.    Also has a small vaginal scratch from wearing jeans that were a little too tight yesterday. Notices some burning when it is hit during urination. No discharge. No redness, swelling.   Problems:  Patient Active Problem List   Diagnosis Date Noted   Urinary incontinence 07/04/2022   GERD 07/04/2022   Colon polyps 07/04/2022   Back pain 07/04/2022   Anxiety 07/04/2022   Adjustment disorder with anxiety 07/04/2022   Family history of early CAD 05/10/2022   Depression 05/10/2022   Rectal bleeding 02/16/2022   Abnormal weight gain 02/16/2022   Physical exam 12/25/2021   Positive ANA (antinuclear antibody) 10/19/2020   Abnormal lung sounds 10/19/2020  Degenerative disc disease, lumbar 05/11/2019   Insulin resistance 02/19/2017   Obesity (BMI 30-39.9) 07/08/2013   Dyshidrotic eczema 09/05/2012   Vitamin D deficiency 08/13/2012    Allergies:  Allergies  Allergen Reactions   Anesthetics, Amide    Codeine Itching and Other (See Comments)   Medications:  Current Outpatient Medications:     ciprofloxacin-dexamethasone (CIPRODEX) OTIC suspension, Place 4 drops into the left ear 2 (two) times daily. For 7 days, Disp: 7.5 mL, Rfl: 0   mupirocin ointment (BACTROBAN) 2 %, Apply 1 Application topically 2 (two) times daily., Disp: 22 g, Rfl: 0   aspirin EC 81 MG tablet, Take 1 tablet (81 mg total) by mouth daily. Swallow whole., Disp: , Rfl:    buPROPion (WELLBUTRIN XL) 300 MG 24 hr tablet, Take 1 tablet (300 mg total) by mouth daily., Disp: 90 tablet, Rfl: 1   estradiol (ESTRACE) 0.1 MG/GM vaginal cream, Use 1/2 g vaginally every night for the first 2 weeks, then use 1/2 g vaginally two or three times per week as needed to maintain symptom relief., Disp: 42.5 g, Rfl: 2   nystatin (MYCOSTATIN/NYSTOP) powder, Apply 1 Application topically 3 (three) times daily. Apply to affected area for up to 7 days, Disp: 30 g, Rfl: 2   rosuvastatin (CRESTOR) 5 MG tablet, Take 1 tablet (5 mg total) by mouth daily., Disp: 90 tablet, Rfl: 3  Observations/Objective: Patient is well-developed, well-nourished in no acute distress.  Resting comfortably at home.  Head is normocephalic, atraumatic.  No labored breathing.  Speech is clear and coherent with logical content.  Patient is alert and oriented at baseline.    Assessment and Plan: 1. Other infective acute otitis externa of left ear - ciprofloxacin-dexamethasone (CIPRODEX) OTIC suspension; Place 4 drops into the left ear 2 (two) times daily. For 7 days  Dispense: 7.5 mL; Refill: 0  2. Non-obstetric vaginal laceration without foreign body or perineal laceration, initial encounter - mupirocin ointment (BACTROBAN) 2 %; Apply 1 Application topically 2 (two) times daily.  Dispense: 22 g; Refill: 0  - Worsening symptoms that have not responded to OTC medications.  - Will give Ciprodex - Continue saline nasal rinses - Could consider to add Flonase (Fluticasone) nasal spray over the counter for possible eustachian tube dysfunction - Steam and humidifier  can help - Warm compress to ear - Stay well hydrated and get plenty of rest.  - Added Mupirocin for vaginal scratch, if needed - Discussed to do an epsom salt soak for pain and inflammation of the vaginal area - Seek in person evaluation if no symptom improvement or if symptoms worsen   Follow Up Instructions: I discussed the assessment and treatment plan with the patient. The patient was provided an opportunity to ask questions and all were answered. The patient agreed with the plan and demonstrated an understanding of the instructions.  A copy of instructions were sent to the patient via MyChart unless otherwise noted below.    The patient was advised to call back or seek an in-person evaluation if the symptoms worsen or if the condition fails to improve as anticipated.    Mikayla Loveless, PA-C

## 2023-09-27 NOTE — Patient Instructions (Signed)
[name], thank you for joining [name], PA-C for today's virtual visit.  While this provider is not your primary care provider (PCP), if your PCP is located in our provider database this encounter information will be shared with them immediately following your visit.   A [facility] MyChart account gives you access to today's visit and all your visits, tests, and labs performed at [facility] " click here if you don't have a [facility] MyChart account or go to [email or web address]  Consent: (Patient) Mikayla [name] provided verbal consent for this virtual visit at the beginning of the encounter.  Current Medications:  Current Outpatient Medications:    ciprofloxacin-dexamethasone (CIPRODEX) OTIC suspension, Place 4 drops into the left ear 2 (two) times daily. For 7 days, Disp: 7.5 mL, Rfl: 0   mupirocin ointment (BACTROBAN) 2 %, Apply 1 Application topically 2 (two) times daily., Disp: 22 g, Rfl: 0   aspirin EC 81 MG tablet, Take 1 tablet (81 mg total) by mouth daily. Swallow whole., Disp: , Rfl:    buPROPion (WELLBUTRIN XL) 300 MG 24 hr tablet, Take 1 tablet (300 mg total) by mouth daily., Disp: 90 tablet, Rfl: 1   estradiol (ESTRACE) 0.1 MG/GM vaginal cream, Use 1/2 g vaginally every night for the first 2 weeks, then use 1/2 g vaginally two or three times per week as needed to maintain symptom relief., Disp: 42.5 g, Rfl: 2   nystatin (MYCOSTATIN/NYSTOP) powder, Apply 1 Application topically 3 (three) times daily. Apply to affected area for up to 7 days, Disp: 30 g, Rfl: 2   rosuvastatin (CRESTOR) 5 MG tablet, Take 1 tablet (5 mg total) by mouth daily., Disp: 90 tablet, Rfl: 3   Medications ordered in this encounter:  Meds ordered this encounter  Medications   ciprofloxacin-dexamethasone (CIPRODEX) OTIC suspension    Sig: Place 4 drops into the left ear 2 (two) times daily. For 7 days    Dispense:  7.5 mL    Refill:  0    Order Specific Question:    Supervising Provider    Answer:   Merrilee Jansky [[id number]]   mupirocin ointment (BACTROBAN) 2 %    Sig: Apply 1 Application topically 2 (two) times daily.    Dispense:  22 g    Refill:  0    Order Specific Question:   Supervising Provider    Answer:   Merrilee Jansky [id number]     *If you need refills on other medications prior to your next appointment, please contact your pharmacy*  Follow-Up: Call back or seek an in-person evaluation if the symptoms worsen or if the condition fails to improve as anticipated.  [facility] [phone]  Other Instructions Otitis Externa  Otitis externa is an infection of the outer ear canal. The outer ear canal is the area between the outside of the ear and the eardrum. Otitis externa is sometimes called swimmer's ear. What are the causes? Common causes of this condition include: Swimming in dirty water. Moisture in the ear. An injury to the inside of the ear. An object stuck in the ear. A cut or scrape on the outside of the ear or in the ear canal. What increases the risk? You are more likely to develop this condition if you go swimming often. What are the signs or symptoms? The first symptom of this condition is often itching in the ear. Later symptoms of the condition include: Swelling of the ear. Redness in  the ear. Ear pain. The pain may get worse when you pull on your ear. Pus coming from the ear. How is this diagnosed? This condition may be diagnosed by examining the ear and testing fluid from the ear for bacteria and funguses. How is this treated? This condition may be treated with: Antibiotic ear drops. These are often given for 10-14 days. Medicines to reduce itching and swelling. Follow these instructions at home: If you were prescribed antibiotic ear drops, use them as told by your health care provider. Do not stop using the antibiotic even if you start to feel better. Take over-the-counter and  prescription medicines only as told by your health care provider. Avoid getting water in your ears as told by your health care provider. This may include avoiding swimming or water sports for a few days. Keep all follow-up visits. This is important. How is this prevented? Keep your ears dry. Use the corner of a towel to dry your ears after you swim or bathe. Avoid scratching or putting things in your ear. Doing these things can damage the ear canal or remove the protective wax that lines it, which makes it easier for bacteria and funguses to grow. Avoid swimming in lakes, polluted water, or swimming pools that may not have enough chlorine. Contact a health care provider if: You have a fever. Your ear is still red, swollen, painful, or draining pus after 3 days. Your redness, swelling, or pain gets worse. You have a severe headache. Get help right away if: You have redness, swelling, and pain or tenderness in the area behind your ear. Summary Otitis externa is an infection of the outer ear canal. Common causes include swimming in dirty water, moisture in the ear, or a cut or scrape in the ear. Symptoms include pain, redness, and swelling of the ear canal. If you were prescribed antibiotic ear drops, use them as told by your health care provider. Do not stop using the antibiotic even if you start to feel better. This information is not intended to replace advice given to you by your health care provider. Make sure you discuss any questions you have with your health care provider. Document Revised: 01/18/2021 Document Reviewed: 01/18/2021 Elsevier Patient Education  2024 Elsevier Inc.    If you have been instructed to have an in-person evaluation today at a local Urgent Care facility, please use the link below. It will take you to a list of all of our available [facility], including address, phone number and hours of operation. Please do not delay care.  [facility]  If you or a family member do not have a primary care provider, use the link below to schedule a visit and establish care. When you choose a [facility] primary care physician or advanced practice provider, you gain a long-term partner in health. Find a Primary Care Provider  Learn more about 's in-office and virtual care options:  - Get Care Now

## 2023-10-08 ENCOUNTER — Encounter: Payer: Self-pay | Admitting: Family Medicine

## 2023-10-08 DIAGNOSIS — H25011 Cortical age-related cataract, right eye: Secondary | ICD-10-CM | POA: Diagnosis not present

## 2023-10-08 DIAGNOSIS — H31001 Unspecified chorioretinal scars, right eye: Secondary | ICD-10-CM | POA: Diagnosis not present

## 2023-10-08 DIAGNOSIS — H5203 Hypermetropia, bilateral: Secondary | ICD-10-CM | POA: Diagnosis not present

## 2023-10-08 DIAGNOSIS — E119 Type 2 diabetes mellitus without complications: Secondary | ICD-10-CM | POA: Diagnosis not present

## 2023-10-08 LAB — HM DIABETES EYE EXAM

## 2023-11-08 ENCOUNTER — Telehealth: Payer: BC Managed Care – PPO | Admitting: Family Medicine

## 2023-11-08 DIAGNOSIS — N3000 Acute cystitis without hematuria: Secondary | ICD-10-CM

## 2023-11-08 MED ORDER — SULFAMETHOXAZOLE-TRIMETHOPRIM 800-160 MG PO TABS: 1.0000 | ORAL_TABLET | Freq: Two times a day (BID) | ORAL | 0 refills | Status: AC

## 2023-11-08 NOTE — Progress Notes (Signed)
E-Visit for Urinary Problems  We are sorry that you are not feeling well.  Here is how we plan to help!  Based on what you shared with me it looks like you most likely have a simple urinary tract infection.  A UTI (Urinary Tract Infection) is a bacterial infection of the bladder.  Most cases of urinary tract infections are simple to treat but a key part of your care is to encourage you to drink plenty of fluids and watch your symptoms carefully.  I have prescribed Bactrim DS One tablet twice a day for 5 days.  Your symptoms should gradually improve. Call us if the burning in your urine worsens, you develop worsening fever, back pain or pelvic pain or if your symptoms do not resolve after completing the antibiotic.  Urinary tract infections can be prevented by drinking plenty of water to keep your body hydrated.  Also be sure when you wipe, wipe from front to back and don't hold it in!  If possible, empty your bladder every 4 hours.  HOME CARE Drink plenty of fluids Compete the full course of the antibiotics even if the symptoms resolve Remember, when you need to go.go. Holding in your urine can increase the likelihood of getting a UTI! GET HELP RIGHT AWAY IF: You cannot urinate You get a high fever Worsening back pain occurs You see blood in your urine You feel sick to your stomach or throw up You feel like you are going to pass out  MAKE SURE YOU  Understand these instructions. Will watch your condition. Will get help right away if you are not doing well or get worse.   Thank you for choosing an e-visit.  Your e-visit answers were reviewed by a board certified advanced clinical practitioner to complete your personal care plan. Depending upon the condition, your plan could have included both over the counter or prescription medications.  Please review your pharmacy choice. Make sure the pharmacy is open so you can pick up prescription now. If there is a problem, you may contact  your provider through [employer] and have the prescription routed to another pharmacy.  Your safety is important to [place]. If you have drug allergies check your prescription carefully.   For the next 24 hours you can use MyChart to ask questions about today's visit, request a non-urgent call back, or ask for a work or school excuse. You will get an email in the next two days asking about your experience. I hope that your e-visit has been valuable and will speed your recover   have provided 5 minutes of non face to face time during this encounter for chart review and documentation.

## 2023-11-15 ENCOUNTER — Ambulatory Visit: Payer: BC Managed Care – PPO

## 2023-12-02 DIAGNOSIS — Z1330 Encounter for screening examination for mental health and behavioral disorders, unspecified: Secondary | ICD-10-CM | POA: Diagnosis not present

## 2023-12-02 DIAGNOSIS — Z9884 Bariatric surgery status: Secondary | ICD-10-CM | POA: Diagnosis not present

## 2023-12-02 DIAGNOSIS — E66812 Obesity, class 2: Secondary | ICD-10-CM | POA: Diagnosis not present

## 2023-12-02 DIAGNOSIS — Z6836 Body mass index (BMI) 36.0-36.9, adult: Secondary | ICD-10-CM | POA: Diagnosis not present

## 2023-12-02 DIAGNOSIS — E785 Hyperlipidemia, unspecified: Secondary | ICD-10-CM | POA: Diagnosis not present

## 2024-02-09 ENCOUNTER — Other Ambulatory Visit: Payer: Self-pay | Admitting: Family Medicine

## 2024-02-10 ENCOUNTER — Ambulatory Visit: Payer: Self-pay

## 2024-02-10 DIAGNOSIS — S8002XA Contusion of left knee, initial encounter: Secondary | ICD-10-CM | POA: Diagnosis not present

## 2024-02-10 NOTE — Telephone Encounter (Signed)
  Chief Complaint: burning with urination Symptoms: burning with urination Frequency: "a couple weeks" Pertinent Negatives: Patient denies fever, abd pain, blood in urine, flank pain Disposition: [] ED /[] Urgent Care (no appt availability in office) / [x] Appointment(In office/virtual)/ []  [facility]/ [] Home Care/ [] Refused Recommended Disposition /[] [facility]/ []  Follow-up with PCP Additional Notes: Patient calls reporting burning with urination for several weeks. Reports she has been taking Azo at home which masks the pain, but it returns after use. Per protocol, patient to be evaluated within 24 hours. First available appointment with PCP outside of guideline. Patient requests morning appt with any provider tomorrow, states she is unable to be seen today due to conflicting appt. Scheduled for 02/11/24 at 0940. Care advice reviewed, patient verbalized understanding and denies further questions at this time. Alerting PCP for review.    Reason for Disposition  Urinating more frequently than usual (i.e., frequency)  Answer Assessment - Initial Assessment Questions 1. SYMPTOM: "What's the main symptom you're concerned about?" (e.g., frequency, incontinence)     Burning with urination 2. ONSET: "When did the  symptoms  start?"     A couple of weeks 3. PAIN: "Is there any pain?" If Yes, ask: "How bad is it?" (Scale: 1-10; mild, moderate, severe)     6/10 4. CAUSE: "What do you think is causing the symptoms?"     UTI 5. OTHER SYMPTOMS: "Do you have any other symptoms?" (e.g., blood in urine, fever, flank pain, pain with urination)     Pain with urination  Protocols used: Urinary Symptoms-A-AH

## 2024-02-11 ENCOUNTER — Encounter: Payer: Self-pay | Admitting: Student in an Organized Health Care Education/Training Program

## 2024-02-11 ENCOUNTER — Ambulatory Visit: Admitting: Student in an Organized Health Care Education/Training Program

## 2024-02-11 VITALS — BP 102/68 | HR 95 | Temp 98.2°F | Ht 62.0 in | Wt 193.2 lb

## 2024-02-11 DIAGNOSIS — N3001 Acute cystitis with hematuria: Secondary | ICD-10-CM

## 2024-02-11 DIAGNOSIS — R350 Frequency of micturition: Secondary | ICD-10-CM | POA: Diagnosis not present

## 2024-02-11 DIAGNOSIS — N3000 Acute cystitis without hematuria: Secondary | ICD-10-CM | POA: Insufficient documentation

## 2024-02-11 DIAGNOSIS — N390 Urinary tract infection, site not specified: Secondary | ICD-10-CM | POA: Insufficient documentation

## 2024-02-11 LAB — POCT URINALYSIS DIPSTICK
Glucose, UA: POSITIVE — AB
Ketones, UA: POSITIVE
Nitrite, UA: POSITIVE
Protein, UA: POSITIVE — AB
Spec Grav, UA: 1.025 (ref 1.010–1.025)
Urobilinogen, UA: 0.2 U/dL
pH, UA: 5 (ref 5.0–8.0)

## 2024-02-11 MED ORDER — PHENAZOPYRIDINE HCL 200 MG PO TABS: 200.0000 mg | ORAL_TABLET | Freq: Three times a day (TID) | ORAL | 0 refills | Status: AC | PRN

## 2024-02-11 MED ORDER — NITROFURANTOIN MONOHYD MACRO 100 MG PO CAPS: 100.0000 mg | ORAL_CAPSULE | Freq: Two times a day (BID) | ORAL | 0 refills | Status: AC

## 2024-02-11 NOTE — Progress Notes (Signed)
 Acute Office Visit  Subjective:     Patient ID: Mikayla [name], female    DOB: 12/13/1965, 58 y.o.   MRN: [id number]  Chief Complaint  Patient presents with   Urinary Frequency    Pt is here w/ C/O of urine burning and urine frequency 4 Sgx 2 days OTC: AZO and Tylenol    HPI Patient is in today for concerns of a urinary tract infection.  Started feeling symptoms yesterday.  She feels strong dysuria.  Increased urinary frequency and urgency.  Mild lower abdominal discomfort.  No fevers or chills.  Eating and drinking well.  No pain in her abdomen.  Started using Azo last night to help with dysuria but it was not successful.  Reports having about 1 or 2 urinary tract infections per year treated with antibiotics.  No allergies to medications, no history of adverse reaction to antibiotics.       Objective:    BP 102/68   Pulse 95   Temp 98.2 F (36.8 C)   Ht 5\' 2"  (1.575 m)   Wt 193 lb 4 oz (87.7 kg)   SpO2 98%   BMI 35.35 kg/m    Physical Exam  General: Well-appearing woman, no distress Heart: Regular, no murmur Abdomen: Soft, mild tenderness suprapubic, nondistended, no rebound or guarding Extremities: Warm well-perfused, no edema, normal joints  Results for orders placed or performed in visit on 02/11/24  POCT urinalysis dipstick  Result Value Ref Range   Color, UA YELLOW    Clarity, UA     Glucose, UA Positive (A) Negative   Bilirubin, UA 3+    Ketones, UA POS    Spec Grav, UA 1.025 1.010 - 1.025   Blood, UA 3+    pH, UA 5.0 5.0 - 8.0   Protein, UA Positive (A) Negative   Urobilinogen, UA 0.2 0.2 or 1.0 E.U./dL   Nitrite, UA pos    Leukocytes, UA Large (3+) (A) Negative   Appearance     Odor yes         Assessment & Plan:   Problem List Items Addressed This Visit       Unprioritized   Acute cystitis   Acute problem.  Symptoms and exam are consistent with simple cystitis.  No systemic symptoms to suggest pyelonephritis.  Point-of-care  urinalysis shows positive excite esterase, nitrates, and even hematuria.  Last antibiotic treatment was in December.  Will prescribe Macrobid 100 mg twice daily for 5 days.  Also prescribed Pyridium to treat patient's dysuria, 200 mg 3 times a day for 2 days.  I recommended the patient return to the office in a few weeks for repeat urinalysis to ensure the hematuria and glucosuria resolves after treatment..      Relevant Medications   nitrofurantoin, macrocrystal-monohydrate, (MACROBID) 100 MG capsule   phenazopyridine (PYRIDIUM) 200 MG tablet   Other Relevant Orders   Urine Culture   Other Visit Diagnoses       Urine frequency    -  Primary   Relevant Orders   POCT urinalysis dipstick (Completed)       Meds ordered this encounter  Medications   nitrofurantoin, macrocrystal-monohydrate, (MACROBID) 100 MG capsule    Sig: Take 1 capsule (100 mg total) by mouth 2 (two) times daily for 5 days.    Dispense:  10 capsule    Refill:  0   phenazopyridine (PYRIDIUM) 200 MG tablet    Sig: Take 1 tablet (200 mg total) by mouth  3 (three) times daily as needed for up to 2 days for pain.    Dispense:  6 tablet    Refill:  0    No follow-ups on file.  Tyson Alias, MD

## 2024-02-11 NOTE — Assessment & Plan Note (Signed)
 Acute problem.  Symptoms and exam are consistent with simple cystitis.  No systemic symptoms to suggest pyelonephritis.  Point-of-care urinalysis shows positive excite esterase, nitrates, and even hematuria.  Last antibiotic treatment was in December.  Will prescribe Macrobid 100 mg twice daily for 5 days.  Also prescribed Pyridium to treat patient's dysuria, 200 mg 3 times a day for 2 days.  I recommended the patient return to the office in a few weeks for repeat urinalysis to ensure the hematuria and glucosuria resolves after treatment.Marland Kitchen

## 2024-02-14 LAB — URINE CULTURE
MICRO NUMBER:: 16244853
SPECIMEN QUALITY:: ADEQUATE

## 2024-02-15 ENCOUNTER — Telehealth: Admitting: Physician Assistant

## 2024-02-15 DIAGNOSIS — J309 Allergic rhinitis, unspecified: Secondary | ICD-10-CM

## 2024-02-15 DIAGNOSIS — J029 Acute pharyngitis, unspecified: Secondary | ICD-10-CM | POA: Diagnosis not present

## 2024-02-15 MED ORDER — BENZONATATE 100 MG PO CAPS: ORAL_CAPSULE | ORAL | 0 refills | Status: DC

## 2024-02-15 MED ORDER — CETIRIZINE HCL 10 MG PO TABS: 10.0000 mg | ORAL_TABLET | Freq: Every day | ORAL | 0 refills | Status: DC

## 2024-02-15 NOTE — Progress Notes (Signed)
 Virtual Visit Consent   Mikayla [name], you are scheduled for a virtual visit with a [facility] provider today. Just as with appointments in the office, your consent must be obtained to participate. Your consent will be active for this visit and any virtual visit you may have with one of our providers in the next 365 days. If you have a MyChart account, a copy of this consent can be sent to you electronically.  As this is a virtual visit, video technology does not allow for your provider to perform a traditional examination. This may limit your provider's ability to fully assess your condition. If your provider identifies any concerns that need to be evaluated in person or the need to arrange testing (such as labs, EKG, etc.), we will make arrangements to do so. Although advances in technology are sophisticated, we cannot ensure that it will always work on either your end or our end. If the connection with a video visit is poor, the visit may have to be switched to a telephone visit. With either a video or telephone visit, we are not always able to ensure that we have a secure connection.  By engaging in this virtual visit, you consent to the provision of healthcare and authorize for your insurance to be billed (if applicable) for the services provided during this visit. Depending on your insurance coverage, you may receive a charge related to this service.  I need to obtain your verbal consent now. Are you willing to proceed with your visit today? Mikayla [name] has provided verbal consent on 02/15/2024 for a virtual visit (video or telephone). Roney Jaffe, PA-C  Date: 02/15/2024 7:53 PM   Virtual Visit via Video Note   I, Mikayla [name], connected with  Mikayla [name]  ([id number], 12/25/1965) on 02/15/24 at  7:45 PM EDT by a video-enabled telemedicine application and verified that I am speaking with the correct person using two identifiers.  Location: Patient: Virtual Visit Location  Patient: Home Provider: Virtual Visit Location Provider: Home Office   I discussed the limitations of evaluation and management by telemedicine and the availability of in person appointments. The patient expressed understanding and agreed to proceed.    History of Present Illness: Mikayla [name] is a 58 y.o. who identifies as a female who was assigned female at birth,  with a recent history of a severe urinary tract infection (UTI) with hematuria, presents with new onset congestion and loss of voice. The symptoms began yesterday and have persisted despite taking Mucinex DM. She denies cough, body aches, fever, and chills. She reports a decreased appetite and taste. She has not been around anyone who is sick. She is currently on the last day of a course of Macrobid for the UTI. She also used Flonase for the congestion. She does not take daily allergy medication.  Results LABS - home testing COVID-19: negative (02/15/2024) Influenza A: negative (02/15/2024) Influenza B: negative (02/15/2024)   Problems:  Patient Active Problem List   Diagnosis Date Noted   Acute cystitis 02/11/2024   Urinary incontinence 07/04/2022   GERD 07/04/2022   Colon polyps 07/04/2022   Back pain 07/04/2022   Anxiety 07/04/2022   Adjustment disorder with anxiety 07/04/2022   Family history of early CAD 05/10/2022   Depression 05/10/2022   Rectal bleeding 02/16/2022   Abnormal weight gain 02/16/2022   Physical exam 12/25/2021   Positive ANA (antinuclear antibody) 10/19/2020   Abnormal lung sounds 10/19/2020   Degenerative disc  disease, lumbar 05/11/2019   Insulin resistance 02/19/2017   Obesity (BMI 30-39.9) 07/08/2013   Dyshidrotic eczema 09/05/2012   Vitamin D deficiency 08/13/2012    Allergies:  Allergies  Allergen Reactions   Anesthetics, Amide    Codeine Itching and Other (See Comments)   Medications:  Current Outpatient Medications:    benzonatate (TESSALON) 100 MG capsule, Take 1-2 caps PO  TID PRN, Disp: 20 capsule, Rfl: 0   cetirizine (ZYRTEC ALLERGY) 10 MG tablet, Take 1 tablet (10 mg total) by mouth daily., Disp: 30 tablet, Rfl: 0   aspirin EC 81 MG tablet, Take 1 tablet (81 mg total) by mouth daily. Swallow whole., Disp: , Rfl:    buPROPion (WELLBUTRIN XL) 300 MG 24 hr tablet, TAKE 1 TABLET BY MOUTH EVERY DAY, Disp: 90 tablet, Rfl: 1   ciprofloxacin-dexamethasone (CIPRODEX) OTIC suspension, Place 4 drops into the left ear 2 (two) times daily. For 7 days, Disp: 7.5 mL, Rfl: 0   estradiol (ESTRACE) 0.1 MG/GM vaginal cream, Use 1/2 g vaginally every night for the first 2 weeks, then use 1/2 g vaginally two or three times per week as needed to maintain symptom relief., Disp: 42.5 g, Rfl: 2   mupirocin ointment (BACTROBAN) 2 %, Apply 1 Application topically 2 (two) times daily., Disp: 22 g, Rfl: 0   nitrofurantoin, macrocrystal-monohydrate, (MACROBID) 100 MG capsule, Take 1 capsule (100 mg total) by mouth 2 (two) times daily for 5 days., Disp: 10 capsule, Rfl: 0   nystatin (MYCOSTATIN/NYSTOP) powder, Apply 1 Application topically 3 (three) times daily. Apply to affected area for up to 7 days, Disp: 30 g, Rfl: 2   rosuvastatin (CRESTOR) 5 MG tablet, Take 1 tablet (5 mg total) by mouth daily., Disp: 90 tablet, Rfl: 3  Observations/Objective: Patient is well-developed, well-nourished in no acute distress.  Resting comfortably  at home.  Head is normocephalic, atraumatic.  No labored breathing.  Speech is clear and coherent with logical content.  Patient is alert and oriented at baseline.    Assessment and Plan: 1. Pharyngitis, unspecified etiology (Primary)  2. Allergic rhinitis, unspecified seasonality, unspecified trigger - cetirizine (ZYRTEC ALLERGY) 10 MG tablet; Take 1 tablet (10 mg total) by mouth daily.  Dispense: 30 tablet; Refill: 0 - benzonatate (TESSALON) 100 MG capsule; Take 1-2 caps PO TID PRN  Dispense: 20 capsule; Refill: 0  Acute onset of symptoms including loss  of voice, congestion, and general malaise starting yesterday. Negative home test for COVID-19 and influenza A and B. Symptoms suggest a viral etiology, possibly exacerbated by seasonal allergies. No fever, chills, or body aches reported. No improvement with Mucinex DM.   - Recommend over-the-counter cold and flu medication for symptom relief. - Prescribe Zyrtec for allergy management. - Continue using Flonase nasal spray.   Follow Up Instructions: I discussed the assessment and treatment plan with the patient. The patient was provided an opportunity to ask questions and all were answered. The patient agreed with the plan and demonstrated an understanding of the instructions.  A copy of instructions were sent to the patient via MyChart unless otherwise noted below.     The patient was advised to call back or seek an in-person evaluation if the symptoms worsen or if the condition fails to improve as anticipated.    Kasandra Knudsen Mayers, PA-C

## 2024-02-15 NOTE — Patient Instructions (Signed)
 [name], thank you for joining Roney Jaffe, PA-C for today's virtual visit.  While this provider is not your primary care provider (PCP), if your PCP is located in our provider database this encounter information will be shared with them immediately following your visit.   A [facility] MyChart account gives you access to today's visit and all your visits, tests, and labs performed at [facility] " click here if you don't have a [facility] MyChart account or go to [email or web address]  Consent: (Patient) Mikayla [name] provided verbal consent for this virtual visit at the beginning of the encounter.  Current Medications:  Current Outpatient Medications:    benzonatate (TESSALON) 100 MG capsule, Take 1-2 caps PO TID PRN, Disp: 20 capsule, Rfl: 0   cetirizine (ZYRTEC ALLERGY) 10 MG tablet, Take 1 tablet (10 mg total) by mouth daily., Disp: 30 tablet, Rfl: 0   aspirin EC 81 MG tablet, Take 1 tablet (81 mg total) by mouth daily. Swallow whole., Disp: , Rfl:    buPROPion (WELLBUTRIN XL) 300 MG 24 hr tablet, TAKE 1 TABLET BY MOUTH EVERY DAY, Disp: 90 tablet, Rfl: 1   ciprofloxacin-dexamethasone (CIPRODEX) OTIC suspension, Place 4 drops into the left ear 2 (two) times daily. For 7 days, Disp: 7.5 mL, Rfl: 0   estradiol (ESTRACE) 0.1 MG/GM vaginal cream, Use 1/2 g vaginally every night for the first 2 weeks, then use 1/2 g vaginally two or three times per week as needed to maintain symptom relief., Disp: 42.5 g, Rfl: 2   mupirocin ointment (BACTROBAN) 2 %, Apply 1 Application topically 2 (two) times daily., Disp: 22 g, Rfl: 0   nitrofurantoin, macrocrystal-monohydrate, (MACROBID) 100 MG capsule, Take 1 capsule (100 mg total) by mouth 2 (two) times daily for 5 days., Disp: 10 capsule, Rfl: 0   nystatin (MYCOSTATIN/NYSTOP) powder, Apply 1 Application topically 3 (three) times daily. Apply to affected area for up to 7 days, Disp: 30 g, Rfl: 2   rosuvastatin (CRESTOR) 5 MG  tablet, Take 1 tablet (5 mg total) by mouth daily., Disp: 90 tablet, Rfl: 3   Medications ordered in this encounter:  Meds ordered this encounter  Medications   cetirizine (ZYRTEC ALLERGY) 10 MG tablet    Sig: Take 1 tablet (10 mg total) by mouth daily.    Dispense:  30 tablet    Refill:  0    Supervising Provider:   Merrilee Jansky [[id number]]   benzonatate (TESSALON) 100 MG capsule    Sig: Take 1-2 caps PO TID PRN    Dispense:  20 capsule    Refill:  0    Supervising Provider:   Merrilee Jansky [[id number]]     *If you need refills on other medications prior to your next appointment, please contact your pharmacy*  Follow-Up: Call back or seek an in-person evaluation if the symptoms worsen or if the condition fails to improve as anticipated.  [facility] [phone]  Other Instructions Allergic Rhinitis, Adult  Allergic rhinitis is an allergic reaction that affects the mucous membrane inside the nose. The mucous membrane is the tissue that produces mucus. There are two types of allergic rhinitis: Seasonal. This type is also called hay fever and happens only during certain seasons. Perennial. This type can happen at any time of the year. Allergic rhinitis cannot be spread from person to person. This condition can be mild, bad, or very bad. It can develop at any age and may be outgrown.  What are the causes? This condition is caused by allergens. These are things that can cause an allergic reaction. Allergens may differ for seasonal allergic rhinitis and perennial allergic rhinitis. Seasonal allergic rhinitis is caused by pollen. Pollen can come from grasses, trees, and weeds. Perennial allergic rhinitis may be caused by: Dust mites. Proteins in a pet's pee (urine), saliva, or dander. Dander is dead skin cells from a pet. Smoke, mold, or car fumes. Remains of or waste from insects such as cockroaches. What increases the risk? You are more likely to develop this  condition if you have a family history of allergies or other conditions related to allergies, including: Allergic conjunctivitis. This is irritation and swelling of parts of the eyes and eyelids. Asthma. This condition affects the lungs and makes it hard to breathe. Atopic dermatitis or eczema. This is long term (chronic) irritation and swelling of the skin. Food allergies. What are the signs or symptoms? Symptoms of this condition include: Sneezing or coughing. A stuffy nose (nasal congestion), itchy nose, or nasal discharge. Itchy eyes and tearing of the eyes. A feeling of mucus dripping down the back of your throat (postnasal drip). This may cause a sore throat. Trouble sleeping. Tiredness. Headache. How is this diagnosed? This condition may be diagnosed with your symptoms, your medical history, and a physical exam. Your health care provider may check for related conditions, such as: Asthma. Pink eye. This is eye swelling and irritation caused by infection (conjunctivitis). Ear infection. Upper respiratory infection. This is an infection in the nose, throat, or upper airways. You may also have tests to find out which allergens cause your symptoms. These may include skin tests or blood tests. How is this treated? There is no cure for this condition, but treatment can help control symptoms. Treatment may include: Taking medicines that block allergy symptoms, such as corticosteroids (anti-inflammatories) and antihistamines. Medicine may be given as a shot, nasal spray, or pill. Avoiding any allergens. Being exposed again and again to tiny amounts of allergens to help you build a defense against allergens (allergenimmunotherapy). This is done if other treatments have not helped. It may include: Allergy shots. These are injected medicines that have small amounts of an allergen in them. Sublingual immunotherapy. This involves taking small doses of a medicine with an allergen in it under your  tongue. If these treatments do not work, your provider may prescribe newer, stronger medicines. Follow these instructions at home: Avoiding allergens Find out what you are allergic to and avoid those allergens. These are some things you can do to help avoid allergens: If you have perennial allergies: Replace carpet with wood, tile, or vinyl flooring. Carpet can trap dander and dust. Do not smoke. Do not allow smoking in your home Change your heating and air conditioning filters at least once a month. If you have seasonal allergies, take these steps during allergy season: Keep windows closed as much as possible. Plan outdoor activities when pollen counts are lowest. Check pollen counts before you plan outdoor activities When coming indoors, change clothing and shower before sitting on furniture or bedding. If you have a pet in the house that produces allergens: Keep the pet out of the bedroom. Vacuum, sweep, and dust regularly. General instructions Take over-the-counter and prescription medicines only as told by your provider. Drink enough fluid to keep your pee pale yellow. Where to find more information American Academy of Allergy, Asthma & Immunology: aaaai.org Contact a health care provider if: You have a fever. You  develop a cough that does not go away. You make high-pitched whistling sounds when you breathe, most often when you breathe out (wheeze). Your symptoms slow you down or stop you from doing your normal activities each day. Get help right away if: You have shortness of breath. This symptom may be an emergency. Get help right away. Call 911. Do not wait to see if the symptoms will go away. Do not drive yourself to the hospital. This information is not intended to replace advice given to you by your health care provider. Make sure you discuss any questions you have with your health care provider. Document Revised: 07/16/2022 Document Reviewed: 07/16/2022 Elsevier Patient  Education  2024 [employer]   If you have been instructed to have an in-person evaluation today at a local Urgent Care facility, please use the link below. It will take you to a list of all of our available [facility], including address, phone number and hours of operation. Please do not delay care.  [facility]  If you or a family member do not have a primary care provider, use the link below to schedule a visit and establish care. When you choose a [facility] primary care physician or advanced practice provider, you gain a long-term partner in health. Find a Primary Care Provider  Learn more about [facility] in-office and virtual care options:  - Get Care Now

## 2024-02-17 ENCOUNTER — Ambulatory Visit: Admitting: Student in an Organized Health Care Education/Training Program

## 2024-02-17 ENCOUNTER — Encounter: Payer: Self-pay | Admitting: Student in an Organized Health Care Education/Training Program

## 2024-02-17 ENCOUNTER — Ambulatory Visit: Payer: Self-pay | Admitting: Family Medicine

## 2024-02-17 VITALS — BP 122/70 | HR 77 | Temp 98.4°F | Ht 62.0 in | Wt 194.4 lb

## 2024-02-17 DIAGNOSIS — N3001 Acute cystitis with hematuria: Secondary | ICD-10-CM | POA: Diagnosis not present

## 2024-02-17 DIAGNOSIS — R300 Dysuria: Secondary | ICD-10-CM

## 2024-02-17 LAB — POCT URINALYSIS DIPSTICK
Bilirubin, UA: NEGATIVE
Blood, UA: 3 — AB
Glucose, UA: NEGATIVE
Ketones, UA: NEGATIVE
Nitrite, UA: NEGATIVE
Protein, UA: NEGATIVE
Spec Grav, UA: 1.02 (ref 1.010–1.025)
Urobilinogen, UA: NEGATIVE U/dL — AB
pH, UA: 5.5 (ref 5.0–8.0)

## 2024-02-17 MED ORDER — CEPHALEXIN 500 MG PO CAPS: 500.0000 mg | ORAL_CAPSULE | Freq: Two times a day (BID) | ORAL | 0 refills | Status: AC

## 2024-02-17 NOTE — Telephone Encounter (Signed)
 Burning with urination x 1.5 week  Symptoms: Feeling "run down," congestion, blood when wiping, frequent urination, 6/10 pain with urination Pertinent Negatives: Patient denies fever, lower back pain, abdomen pain, nausea  Disposition: [x] Appointment(In office)  Additional Notes: Pt saw Dr. Oswaldo Done in office on 3/25 for a UTI. Pt was prescribed nitrofurantoin 100 mg bid x5 days. Pt finished antibiotic but is still having pain with urination (6/10) and blood when she wipes. Pt scheduled for appt today with Dr. Oswaldo Done (pt request to see this provider again). This RN educated pt on new-worsening symptoms and when to call back/seek emergent care. Pt verbalized understanding and agrees to plan.   Copied from CRM [id number]. Topic: Clinical - Red Word Triage >> Feb 17, 2024 11:34 AM Arley Phenix D wrote: Red Word that prompted transfer to Nurse Triage: Blood/Burning with Urine  Patient stated that she wanted to make an appointment to come in the clinic due to blood and burning with urine. Patient stated that she has an UTI. Reason for Disposition  Pain or burning with passing urine  Answer Assessment - Initial Assessment Questions Burning with urination x 1.5 week  Symptoms: Feeling "run down," congestion, blood when wiping, frequent urination, 6/10 pain with urination Pertinent Negatives: Patient denies fever, lower back pain, abdomen pain, nausea  Protocols used: Urine - Blood In-A-AH

## 2024-02-17 NOTE — Progress Notes (Signed)
 Acute Office Visit  Subjective:     Patient ID: Mikayla [name], female    DOB: July 18, 1966, 58 y.o.   MRN: [id number]  Chief Complaint  Patient presents with   Dysuria    Pt stated that she has had some burning with urination and when she wipes, there is blood on the tissue. These symptoms started yesterday.     HPI  Patient is in today for dysuria.  I saw the patient in the office last week for similar issue on Tuesday.  She had dysuria and we treated her with Macrobid for acute cystitis based on the urinalysis.  Urine culture returned positive for E. coli that was pansensitive.  She was doing well after treatment, completed Macrobid 5 days on Saturday.  Dysuria had resolved.  Then on Sunday she started to feel localized dysuria again.  Her urine turned dark orange and she noticed some blood with wiping.  She has not taken a Pyridium dose in over 3 days.  Denies fevers or chills.  No abdominal pain.  No suprapubic discomfort.  Eating and drinking well.  She does have a new nonproductive cough and nasal congestion consistent with a viral URI.      Objective:    BP 122/70   Pulse 77   Temp 98.4 F (36.9 C)   Ht 5\' 2"  (1.575 m)   Wt 194 lb 6.4 oz (88.2 kg)   SpO2 96%   BMI 35.56 kg/m    Physical Exam  Gen: Well-appearing woman, no distress Ears: Not impacted cerumen bilaterally Mouth: Mild erythema in oropharynx Lungs: Clear to auscultation with no crackles Heart: Regular, no murmur Abd: Soft, nontender, nondistended, no tenderness over the bladder    Results for orders placed or performed in visit on 02/17/24  POCT Urinalysis Dipstick  Result Value Ref Range   Color, UA yellow    Clarity, UA cloudy    Glucose, UA Negative Negative   Bilirubin, UA negative    Ketones, UA negative    Spec Grav, UA 1.020 1.010 - 1.025   Blood, UA 3 (A)    pH, UA 5.5 5.0 - 8.0   Protein, UA Negative Negative   Urobilinogen, UA negative (A) 0.2 or 1.0 E.U./dL   Nitrite, UA  negative    Leukocytes, UA Moderate (2+) (A) Negative   Appearance     Odor          Assessment & Plan:   Problem List Items Addressed This Visit       Unprioritized   Acute cystitis   Acute recurrent problem.  We treated her for a acute cystitis 3/25 - 3/30.  She was feeling better by the end of treatment with Macrobid.  Then on 331 the sensation of dysuria has returned along with hematuria.  I reviewed urine culture from last week, it was a pansensitive E. coli.  The MIC on the Macrobid was a little unfavorable at 16.  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  She also has a new bidet that might be the culprit.  I am going to treat with Keflex which had an MIC of 4, for 7 days.  Will send this urine sample for culture to look for resistance.  We talked about the hematuria which I think is probably either a false positive from recent use of Pyridium, or from the cystitis.  Will plan to recheck a  urinalysis in 3-4 weeks once she has recovered.      Relevant Medications   cephALEXin (KEFLEX) 500 MG capsule   Other Visit Diagnoses       Dysuria    -  Primary   Relevant Orders   POCT Urinalysis Dipstick (Completed)   Urine Culture       Meds ordered this encounter  Medications   cephALEXin (KEFLEX) 500 MG capsule    Sig: Take 1 capsule (500 mg total) by mouth 2 (two) times daily for 7 days.    Dispense:  14 capsule    Refill:  0    No follow-ups on file.  Tyson Alias, MD

## 2024-02-17 NOTE — Assessment & Plan Note (Signed)
 Acute recurrent problem.  We treated her for a acute cystitis 3/25 - 3/30.  She was feeling better by the end of treatment with Macrobid.  Then on 331 the sensation of dysuria has returned along with hematuria.  I reviewed urine culture from last week, it was a pansensitive E. coli.  The MIC on the Macrobid was a little unfavorable at 16.  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  She also has a new bidet that might be the culprit.  I am going to treat with Keflex which had an MIC of 4, for 7 days.  Will send this urine sample for culture to look for resistance.  We talked about the hematuria which I think is probably either a false positive from recent use of Pyridium, or from the cystitis.  Will plan to recheck a urinalysis in 3-4 weeks once she has recovered.

## 2024-02-20 LAB — URINE CULTURE
MICRO NUMBER:: 16269054
SPECIMEN QUALITY:: ADEQUATE

## 2024-02-26 ENCOUNTER — Encounter: Payer: Self-pay | Admitting: Student in an Organized Health Care Education/Training Program

## 2024-02-26 ENCOUNTER — Ambulatory Visit

## 2024-02-26 ENCOUNTER — Ambulatory Visit: Admitting: Student in an Organized Health Care Education/Training Program

## 2024-02-26 VITALS — BP 102/60 | HR 74 | Temp 97.8°F | Wt 196.0 lb

## 2024-02-26 DIAGNOSIS — N1330 Unspecified hydronephrosis: Secondary | ICD-10-CM | POA: Diagnosis not present

## 2024-02-26 DIAGNOSIS — N390 Urinary tract infection, site not specified: Secondary | ICD-10-CM | POA: Diagnosis not present

## 2024-02-26 DIAGNOSIS — N3001 Acute cystitis with hematuria: Secondary | ICD-10-CM | POA: Diagnosis not present

## 2024-02-26 DIAGNOSIS — K8020 Calculus of gallbladder without cholecystitis without obstruction: Secondary | ICD-10-CM | POA: Diagnosis not present

## 2024-02-26 DIAGNOSIS — R350 Frequency of micturition: Secondary | ICD-10-CM | POA: Diagnosis not present

## 2024-02-26 DIAGNOSIS — N3090 Cystitis, unspecified without hematuria: Secondary | ICD-10-CM | POA: Diagnosis not present

## 2024-02-26 DIAGNOSIS — N3289 Other specified disorders of bladder: Secondary | ICD-10-CM | POA: Diagnosis not present

## 2024-02-26 LAB — CBC WITH DIFFERENTIAL/PLATELET
Basophils Absolute: 0 10*3/uL (ref 0.0–0.1)
Basophils Relative: 0.3 % (ref 0.0–3.0)
Eosinophils Absolute: 0.1 10*3/uL (ref 0.0–0.7)
Eosinophils Relative: 1.7 % (ref 0.0–5.0)
HCT: 35.7 % — ABNORMAL LOW (ref 36.0–46.0)
Hemoglobin: 11.7 g/dL — ABNORMAL LOW (ref 12.0–15.0)
Lymphocytes Relative: 20.2 % (ref 12.0–46.0)
Lymphs Abs: 1.7 10*3/uL (ref 0.7–4.0)
MCHC: 32.8 g/dL (ref 30.0–36.0)
MCV: 95.5 fl (ref 78.0–100.0)
Monocytes Absolute: 0.6 10*3/uL (ref 0.1–1.0)
Monocytes Relative: 7 % (ref 3.0–12.0)
Neutro Abs: 5.8 10*3/uL (ref 1.4–7.7)
Neutrophils Relative %: 70.8 % (ref 43.0–77.0)
Platelets: 166 10*3/uL (ref 150.0–400.0)
RBC: 3.73 Mil/uL — ABNORMAL LOW (ref 3.87–5.11)
RDW: 13.7 % (ref 11.5–15.5)
WBC: 8.2 10*3/uL (ref 4.0–10.5)

## 2024-02-26 LAB — POCT URINALYSIS DIPSTICK
Glucose, UA: POSITIVE — AB
Ketones, UA: POSITIVE — AB
Nitrite, UA: POSITIVE
Protein, UA: POSITIVE — AB
Spec Grav, UA: >=1.03 — AB (ref 1.010–1.025)
Urobilinogen, UA: 2 U/dL — AB
pH, UA: 5 (ref 5.0–8.0)

## 2024-02-26 LAB — URINALYSIS, ROUTINE W REFLEX MICROSCOPIC

## 2024-02-26 LAB — BASIC METABOLIC PANEL WITH GFR
BUN: 17 mg/dL (ref 6–23)
CO2: 30 meq/L (ref 19–32)
Calcium: 8.8 mg/dL (ref 8.4–10.5)
Chloride: 107 meq/L (ref 96–112)
Creatinine, Ser: 0.59 mg/dL (ref 0.40–1.20)
GFR: 98.43 mL/min (ref >60.00)
Glucose, Bld: 74 mg/dL (ref 70–99)
Potassium: 3.9 meq/L (ref 3.5–5.1)
Sodium: 142 meq/L (ref 135–145)

## 2024-02-26 MED ORDER — CEPHALEXIN 250 MG PO CAPS: 250.0000 mg | ORAL_CAPSULE | Freq: Every day | ORAL | 1 refills | Status: DC

## 2024-02-26 MED ORDER — PHENAZOPYRIDINE HCL 200 MG PO TABS: 200.0000 mg | ORAL_TABLET | Freq: Three times a day (TID) | ORAL | 0 refills | Status: AC | PRN

## 2024-02-26 MED ORDER — CEPHALEXIN 500 MG PO CAPS: 500.0000 mg | ORAL_CAPSULE | Freq: Two times a day (BID) | ORAL | 0 refills | Status: AC

## 2024-02-26 MED ORDER — IOHEXOL 300 MG/ML  SOLN
200.0000 mL | Freq: Once | INTRAMUSCULAR | Status: AC | PRN
  Administered 2024-02-26: 125 mL via INTRAVENOUS

## 2024-02-26 NOTE — Assessment & Plan Note (Signed)
 Unfortunate situation of another acute episode of cystitis only 1 day after finishing antibiotics for the last episode.  This is her third episode of acute cystitis over the last 3 weeks.  Urine culture has been pansensitive, she tolerated the antibiotics well, good adherence, and had good results after only few days of taking the antibiotic.  No history of structural urologic disease.  She has had a vaginal mesh surgery, and a history of gastric bypass surgery.  She is having some hematuria.  Given the recurrent nature of this infection I am going to order a CT urogram to evaluate for structural disease.  Will send urine for culture again.  I am going to treat this acute infection with another course of cephalexin 500 mg twice daily for 7 days and then follow that with indefinite low-dose prophylaxis cephalexin 250 mg once daily.  No signs of pyelonephritis today, not much systemic symptoms, but will check renal function and CBC today as well.

## 2024-02-26 NOTE — Addendum Note (Signed)
 Addended by: Erlinda Hong T on: 02/26/2024 02:13 PM   Modules accepted: Orders

## 2024-02-26 NOTE — Progress Notes (Signed)
 Acute Office Visit  Subjective:     Patient ID: Mikayla [name], female    DOB: 02-14-1966, 58 y.o.   MRN: [id number]  Chief Complaint  Patient presents with   Urinary Tract Infection    Having pain in vaginal area that feels like a knife and stinging patient states. When wiping after urinating patient states the color on the tissue is bright red/pink color. Right side/ back pain. Finished antibiotics on Monday. Trouble making it to the bathroom in time.     HPI  Patient is in today for recurrent urinary tract infection.  She was doing well on a course of Keflex for a second episode of UTI last week.  Her symptoms improved after a few days of taking that medication.  She finished medication on Monday evening, and then Tuesday night started to have onset of urinary urgency and dysuria.  She took a dose of Pyridium but did not have much relief.  No fevers or chills.  She is eating and drinking well with no nausea.  No systemic symptoms.  She is having some local vaginal discomfort, it radiates to around her rectum.  Not having diarrhea.  See some pink urine when she wipes.  Good hygiene techniques, stopped using the bidet.  Not currently using vaginal estrogen because of local irritation.      Objective:    BP 102/60   Pulse 74   Temp 97.8 F (36.6 C) (Temporal)   Wt 196 lb (88.9 kg)   SpO2 98%   BMI 35.85 kg/m    Physical Exam  Gen: Tired appearing woman Heart: Regular, no murmur Lungs: Unlabored Abd: Soft, nontender, nondistended, no tenderness over the bladder Ext: Warm, no edema   Results for orders placed or performed in visit on 02/26/24  POCT urinalysis dipstick  Result Value Ref Range   Color, UA orange    Clarity, UA turbid    Glucose, UA Positive (A) Negative   Bilirubin, UA postive (A)    Ketones, UA positive (A)    Spec Grav, UA >=1.030 (A) 1.010 - 1.025   Blood, UA large    pH, UA 5.0 5.0 - 8.0   Protein, UA Positive (A) Negative   Urobilinogen, UA  2.0 (A) 0.2 or 1.0 E.U./dL   Nitrite, UA positive    Leukocytes, UA Large (3+) (A) Negative   Appearance     Odor          Assessment & Plan:   Problem List Items Addressed This Visit       Unprioritized   Recurrent UTI - Primary   Unfortunate situation of another acute episode of cystitis only 1 day after finishing antibiotics for the last episode.  This is her third episode of acute cystitis over the last 3 weeks.  Urine culture has been pansensitive, she tolerated the antibiotics well, good adherence, and had good results after only few days of taking the antibiotic.  No history of structural urologic disease.  She has had a vaginal mesh surgery, and a history of gastric bypass surgery.  She is having some hematuria.  Given the recurrent nature of this infection I am going to order a CT urogram to evaluate for structural disease.  Will send urine for culture again.  I am going to treat this acute infection with another course of cephalexin 500 mg twice daily for 7 days and then follow that with indefinite low-dose prophylaxis cephalexin 250 mg once daily.  No signs of  pyelonephritis today, not much systemic symptoms, but will check renal function and CBC today as well.      Relevant Medications   cephALEXin (KEFLEX) 500 MG capsule   cephALEXin (KEFLEX) 250 MG capsule (Start on 03/04/2024)   Other Relevant Orders   Urine Culture   Urinalysis, Routine w reflex microscopic   Basic metabolic panel with GFR   CBC with Differential/Platelet   CT ABDOMEN PELVIS W CONTRAST   Other Visit Diagnoses       Urine frequency       Relevant Orders   POCT urinalysis dipstick (Completed)     Acute cystitis with hematuria       Relevant Orders   CT ABDOMEN PELVIS W CONTRAST       Meds ordered this encounter  Medications   cephALEXin (KEFLEX) 500 MG capsule    Sig: Take 1 capsule (500 mg total) by mouth 2 (two) times daily for 7 days.    Dispense:  14 capsule    Refill:  0   cephALEXin  (KEFLEX) 250 MG capsule    Sig: Take 1 capsule (250 mg total) by mouth daily.    Dispense:  30 capsule    Refill:  1    Daily prophylaxis to be taken once she finishes the acute 7 day treatment    No follow-ups on file.  Tyson Alias, MD

## 2024-02-27 DIAGNOSIS — R8271 Bacteriuria: Secondary | ICD-10-CM | POA: Diagnosis not present

## 2024-02-27 DIAGNOSIS — N130 Hydronephrosis with ureteropelvic junction obstruction: Secondary | ICD-10-CM | POA: Diagnosis not present

## 2024-02-27 DIAGNOSIS — R350 Frequency of micturition: Secondary | ICD-10-CM | POA: Diagnosis not present

## 2024-02-27 DIAGNOSIS — N952 Postmenopausal atrophic vaginitis: Secondary | ICD-10-CM | POA: Diagnosis not present

## 2024-02-27 DIAGNOSIS — N3020 Other chronic cystitis without hematuria: Secondary | ICD-10-CM | POA: Diagnosis not present

## 2024-02-27 DIAGNOSIS — R310 Gross hematuria: Secondary | ICD-10-CM | POA: Diagnosis not present

## 2024-02-28 ENCOUNTER — Telehealth: Payer: Self-pay | Admitting: Student in an Organized Health Care Education/Training Program

## 2024-02-28 NOTE — Telephone Encounter (Signed)
 I spoke with the patient by phone this morning.  She is feeling better on the antibiotic.  Abdominal discomfort and dysuria are improved.  Still having frequency and urgency.  She consulted with urology in the office yesterday.  They are planning for cystoscopy to evaluate the right-sided hydronephrosis at some point over the next 2 weeks. Will continue antibiotics at suppressive dose indefinitely.

## 2024-02-29 LAB — URINE CULTURE
MICRO NUMBER:: 16308925
SPECIMEN QUALITY:: ADEQUATE

## 2024-03-03 ENCOUNTER — Other Ambulatory Visit: Payer: Self-pay | Admitting: Urology

## 2024-03-12 NOTE — Patient Instructions (Signed)
 DUE TO COVID-19 ONLY TWO VISITORS  (aged 60 and older)  ARE ALLOWED TO COME WITH YOU AND STAY IN THE WAITING ROOM ONLY DURING PRE OP AND PROCEDURE.   **NO VISITORS ARE ALLOWED IN THE SHORT STAY AREA OR RECOVERY ROOM!!**  IF YOU WILL BE ADMITTED INTO THE HOSPITAL YOU ARE ALLOWED ONLY FOUR SUPPORT PEOPLE DURING VISITATION HOURS ONLY (7 AM -8PM)   The support person(s) must pass our screening, gel in and out, and wear a mask at all times, including in the patient's room. Patients must also wear a mask when staff or their support person are in the room. Visitors GUEST BADGE MUST BE WORN VISIBLY  One adult visitor may remain with you overnight and MUST be in the room by 8 P.M.     Your procedure is scheduled on: 03/30/24   Report to [facility] Main Entrance    Report to admitting at : 12:15 PM   Call this number if you have problems the morning of surgery [phone]   Do not eat food :After Midnight.   After Midnight you may have the following liquids until : 11:30 AM DAY OF SURGERY  Water  Black Coffee (sugar ok, NO MILK/CREAM OR CREAMERS)  Tea (sugar ok, NO MILK/CREAM OR CREAMERS) regular and decaf                             Plain Jell-O (NO RED)                                           Fruit ices (not with fruit pulp, NO RED)                                     Popsicles (NO RED)                                                                  Juice: apple, WHITE grape, WHITE cranberry Sports drinks like Gatorade (NO RED)              FOLLOW ANY ADDITIONAL PRE OP INSTRUCTIONS YOU RECEIVED FROM YOUR SURGEON'S OFFICE!!!   Oral Hygiene is also important to reduce your risk of infection.                                    Remember - BRUSH YOUR TEETH THE MORNING OF SURGERY WITH YOUR REGULAR TOOTHPASTE  DENTURES WILL BE REMOVED PRIOR TO SURGERY PLEASE DO NOT APPLY "Poly grip" OR ADHESIVES!!!   Do NOT smoke after Midnight   Take these medicines the morning of surgery with A  SIP OF WATER : bupropion   STOP TAKING all Vitamins, Herbs and supplements 1 week before your surgery.                               You may not have any metal on your body including hair pins,  jewelry, and body piercing             Do not wear make-up, lotions, powders, perfumes/cologne, or deodorant  Do not wear nail polish including gel and S&S, artificial/acrylic nails, or any other type of covering on natural nails including finger and toenails. If you have artificial nails, gel coating, etc. that needs to be removed by a nail salon please have this removed prior to surgery or surgery may need to be canceled/ delayed if the surgeon/ anesthesia feels like they are unable to be safely monitored.   Do not shave  48 hours prior to surgery.    Do not bring valuables to the hospital. [facility] IS NOT             RESPONSIBLE   FOR VALUABLES.   Contacts, glasses, or bridgework may not be worn into surgery.   Bring small overnight bag day of surgery.   DO NOT BRING YOUR HOME MEDICATIONS TO THE HOSPITAL. PHARMACY WILL DISPENSE MEDICATIONS LISTED ON YOUR MEDICATION LIST TO YOU DURING YOUR ADMISSION IN THE HOSPITAL!    Patients discharged on the day of surgery will not be allowed to drive home.  Someone NEEDS to stay with you for the first 24 hours after anesthesia.   Special Instructions: Bring a copy of your healthcare power of attorney and living will documents         the day of surgery if you haven't scanned them before.              Please read over the following fact sheets you were given: IF YOU HAVE QUESTIONS ABOUT YOUR PRE-OP INSTRUCTIONS PLEASE CALL [phone]    [facility] - Preparing for Surgery Before surgery, you can play an important role.  Because skin is not sterile, your skin needs to be as free of germs as possible.  You can reduce the number of germs on your skin by washing with CHG (chlorahexidine gluconate) soap before surgery.  CHG is an antiseptic cleaner which kills  germs and bonds with the skin to continue killing germs even after washing. Please DO NOT use if you have an allergy to CHG or antibacterial soaps.  If your skin becomes reddened/irritated stop using the CHG and inform your nurse when you arrive at Short Stay. Do not shave (including legs and underarms) for at least 48 hours prior to the first CHG shower.  You may shave your face/neck. Please follow these instructions carefully:  1.  Shower with CHG Soap the night before surgery and the  morning of Surgery.  2.  If you choose to wash your hair, wash your hair first as usual with your  normal  shampoo.  3.  After you shampoo, rinse your hair and body thoroughly to remove the  shampoo.                           4.  Use CHG as you would any other liquid soap.  You can apply chg directly  to the skin and wash                       Gently with a scrungie or clean washcloth.  5.  Apply the CHG Soap to your body ONLY FROM THE NECK DOWN.   Do not use on face/ open  Wound or open sores. Avoid contact with eyes, ears mouth and genitals (private parts).                       Wash face,  Genitals (private parts) with your normal soap.             6.  Wash thoroughly, paying special attention to the area where your surgery  will be performed.  7.  Thoroughly rinse your body with warm water  from the neck down.  8.  DO NOT shower/wash with your normal soap after using and rinsing off  the CHG Soap.                9.  Pat yourself dry with a clean towel.            10.  Wear clean pajamas.            11.  Place clean sheets on your bed the night of your first shower and do not  sleep with pets. Day of Surgery : Do not apply any lotions/deodorants the morning of surgery.  Please wear clean clothes to the hospital/surgery center.  FAILURE TO FOLLOW THESE INSTRUCTIONS MAY RESULT IN THE CANCELLATION OF YOUR SURGERY PATIENT SIGNATURE_________________________________  NURSE  SIGNATURE__________________________________  ________________________________________________________________________

## 2024-03-13 ENCOUNTER — Ambulatory Visit: Admitting: Student in an Organized Health Care Education/Training Program

## 2024-03-13 ENCOUNTER — Encounter: Payer: Self-pay | Admitting: Student in an Organized Health Care Education/Training Program

## 2024-03-13 DIAGNOSIS — K911 Postgastric surgery syndromes: Secondary | ICD-10-CM | POA: Diagnosis not present

## 2024-03-13 DIAGNOSIS — E162 Hypoglycemia, unspecified: Secondary | ICD-10-CM | POA: Insufficient documentation

## 2024-03-13 DIAGNOSIS — N390 Urinary tract infection, site not specified: Secondary | ICD-10-CM

## 2024-03-13 LAB — BASIC METABOLIC PANEL WITH GFR
BUN: 18 mg/dL (ref 6–23)
CO2: 31 meq/L (ref 19–32)
Calcium: 9.3 mg/dL (ref 8.4–10.5)
Chloride: 103 meq/L (ref 96–112)
Creatinine, Ser: 0.62 mg/dL (ref 0.40–1.20)
GFR: 97.23 mL/min (ref >60.00)
Glucose, Bld: 92 mg/dL (ref 70–99)
Potassium: 4.4 meq/L (ref 3.5–5.1)
Sodium: 139 meq/L (ref 135–145)

## 2024-03-13 LAB — GLUCOSE, POCT (MANUAL RESULT ENTRY): POC Glucose: 85 mg/dL (ref 70–99)

## 2024-03-13 LAB — POCT URINALYSIS DIPSTICK
Bilirubin, UA: 2
Blood, UA: NEGATIVE
Glucose, UA: NEGATIVE
Ketones, UA: NEGATIVE
Leukocytes, UA: NEGATIVE
Nitrite, UA: NEGATIVE
Protein, UA: NEGATIVE
Spec Grav, UA: 1.025 (ref 1.010–1.025)
Urobilinogen, UA: 0.2 U/dL
pH, UA: 6 (ref 5.0–8.0)

## 2024-03-13 NOTE — Assessment & Plan Note (Signed)
 Acute issue over the last week with a sensation of discomfort, flushing, sweating, and shaking often associated with eating.  This has been associated with some mild hypoglycemia.  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  Patient had a Roux-en-Y gastric bypass in 2023.  Has not had issues with dumping syndrome prior to this.  I will do an evaluation of the hypoglycemia, I gave her some information about supportive care for dumping syndrome.  I recommended smaller meals over longer periods of time, and recommended keeping a journal of symptoms, how they are associated with food, and what her blood sugars are during those symptoms.  Follow back up with me in 2 weeks.

## 2024-03-13 NOTE — Progress Notes (Signed)
 Acute Office Visit  Subjective:     Patient ID: Mikayla [name], female    DOB: May 15, 1966, 58 y.o.   MRN: [id number]  Low blood sugars  HPI  Patient is in today for concern of low blood sugars.  Patient is well-known to me over the last few weeks for 3 episodes of recurrent urinary tract infection that we have been managing.  She is now on prophylactic antibiotics with Macrobid , feeling much better, no dysuria or fevers, though she does have urinary frequency.  Seems to have a new issue today of a sensation of shakiness and jitteriness that has occurred while she is eating over the last few days.  These are associated with flushing sensation and sweating sensation and some discomfort in her abdomen.  She did have a Roux-en-Y gastric bypass completed about a year and a half ago.  Has not had any issues with dumping syndrome up until now.  Denies any changes in her diet.  She has been trying to make modifications and eating smaller meals.  She is not doing any fasting.  Takes no medications for diabetes.  She has checked her blood sugar a couple times using her husband's supplies and found a low blood sugar of 64 after eating while she was feeling the symptoms.  Other blood sugars have been normal, last checked was this morning at 3 AM while she was fasting and it was 130.      Objective:    There were no vitals taken for this visit.   Physical Exam  Gen: Well-appearing woman, no distress Heart: Regular, no murmur Lungs: Unlabored, clear throughout Ext: Warm, no edema Neuro: Alert, conversational, full strength upper and lower extremities, normal gait   Results for orders placed or performed in visit on 03/13/24  POCT urinalysis dipstick  Result Value Ref Range   Color, UA     Clarity, UA     Glucose, UA Negative Negative   Bilirubin, UA 2    Ketones, UA negative    Spec Grav, UA 1.025 1.010 - 1.025   Blood, UA negative    pH, UA 6.0 5.0 - 8.0   Protein, UA Negative  Negative   Urobilinogen, UA 0.2 0.2 or 1.0 E.U./dL   Nitrite, UA negative    Leukocytes, UA Negative Negative   Appearance     Odor    POCT glucose (manual entry)  Result Value Ref Range   POC Glucose 85 70 - 99 mg/dl        Assessment & Plan:   Problem List Items Addressed This Visit       Unprioritized   Recurrent UTI   Symptoms of dysuria are much improved though she still having urinary frequency.  Urinalysis today is without pyuria or hematuria.  This is being comanaged with urology because she had evidence of bladder wall thickening and mild right-sided hydronephrosis on imaging.  I have put her on long-term prophylactic antibiotics with Keflex , the urologist changed this to Macrobid  100 mg daily which is also fine.  She is planned for cystoscopy on May 12.  Will continue with prophylactic antibiotics indefinitely.      Relevant Orders   POCT urinalysis dipstick (Completed)   Hypoglycemia - Primary   Mild episode of hypoglycemia as low as 64 associated with some symptoms of shakiness and jitteriness.  She has no history of diabetes, no medications that cause hypoglycemia.  She has a history of Roux-en-Y gastric bypass so may have a  reactive hypoglycemia associated with food intake.  Low suspicion for pancreatic insulinoma, but he recently had a CT abdomen with contrast and showed normal appearing pancreas.  I will check insulin  levels and C-peptide.      Relevant Orders   Basic metabolic panel with GFR   Insulin  and C-Peptide   POCT urinalysis dipstick (Completed)   POCT glucose (manual entry) (Completed)   Dumping syndrome   Acute issue over the last week with a sensation of discomfort, flushing, sweating, and shaking often associated with eating.  This has been associated with some mild hypoglycemia.  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  Patient had a Roux-en-Y gastric bypass in 2023.  Has not had issues with dumping syndrome  prior to this.  I will do an evaluation of the hypoglycemia, I gave her some information about supportive care for dumping syndrome.  I recommended smaller meals over longer periods of time, and recommended keeping a journal of symptoms, how they are associated with food, and what her blood sugars are during those symptoms.  Follow back up with me in 2 weeks.       Return in about 2 weeks (around 03/27/2024).  Ether Hercules, MD

## 2024-03-13 NOTE — Assessment & Plan Note (Signed)
 Symptoms of dysuria are much improved though she still having urinary frequency.  Urinalysis today is without pyuria or hematuria.  This is being comanaged with urology because she had evidence of bladder wall thickening and mild right-sided hydronephrosis on imaging.  I have put her on long-term prophylactic antibiotics with Keflex , the urologist changed this to Macrobid  100 mg daily which is also fine.  She is planned for cystoscopy on May 12.  Will continue with prophylactic antibiotics indefinitely.

## 2024-03-13 NOTE — Assessment & Plan Note (Signed)
 Mild episode of hypoglycemia as low as 64 associated with some symptoms of shakiness and jitteriness.  She has no history of diabetes, no medications that cause hypoglycemia.  She has a history of Roux-en-Y gastric bypass so may have a reactive hypoglycemia associated with food intake.  Low suspicion for pancreatic insulinoma, but he recently had a CT abdomen with contrast and showed normal appearing pancreas.  I will check insulin  levels and C-peptide.

## 2024-03-14 LAB — INSULIN AND C-PEPTIDE, SERUM
C-Peptide: 2.4 ng/mL (ref 1.1–4.4)
INSULIN: 11 u[IU]/mL (ref 2.6–24.9)

## 2024-03-16 ENCOUNTER — Encounter: Payer: Self-pay | Admitting: Student in an Organized Health Care Education/Training Program

## 2024-03-16 ENCOUNTER — Encounter (HOSPITAL_COMMUNITY)
Admission: RE | Admit: 2024-03-16 | Discharge: 2024-03-16 | Disposition: A | Discharge status: Home / Self Care | Source: Ambulatory Visit | Attending: Anesthesiology | Admitting: Anesthesiology

## 2024-03-16 DIAGNOSIS — Z01818 Encounter for other preprocedural examination: Secondary | ICD-10-CM

## 2024-03-22 NOTE — Patient Instructions (Signed)
 SURGICAL WAITING ROOM VISITATION Patients having surgery or a procedure may have no more than 2 support people in the waiting area - these visitors may rotate in the visitor waiting room.   If the patient needs to stay at the hospital during part of their recovery, the visitor guidelines for inpatient rooms apply.  PRE-OP VISITATION  Pre-op nurse will coordinate an appropriate time for 1 support person to accompany the patient in pre-op.  This support person may not rotate.  This visitor will be contacted when the time is appropriate for the visitor to come back in the pre-op area.  Please refer to the [facility] website for the visitor guidelines for Inpatients (after your surgery is over and you are in a regular room).  You are not required to quarantine at this time prior to your surgery. However, you must do this: Hand Hygiene often Do NOT share personal items Notify your provider if you are in close contact with someone who has COVID or you develop fever 100.4 or greater, new onset of sneezing, cough, sore throat, shortness of breath or body aches.  If you test positive for Covid or have been in contact with anyone that has tested positive in the last 10 days please notify you surgeon.    Your procedure is scheduled on:  Monday  Mar 30, 2024  Report to [facility] Main Entrance: [address] entrance where the [employer] is available.   Report to admitting at:  12:15   AM  Call this number if you have any questions or problems the morning of surgery [phone]  DO NOT EAT OR DRINK ANYTHING AFTER MIDNIGHT THE NIGHT PRIOR TO YOUR SURGERY / PROCEDURE.   FOLLOW  ANY ADDITIONAL PRE OP INSTRUCTIONS YOU RECEIVED FROM YOUR SURGEON'S OFFICE!!!   Oral Hygiene is also important to reduce your risk of infection.        Remember - BRUSH YOUR TEETH THE MORNING OF SURGERY WITH YOUR REGULAR TOOTHPASTE  Do NOT smoke after Midnight the night before surgery.  STOP TAKING all Vitamins,  Herbs and supplements 1 week before your surgery.   Take ONLY these medicines the morning of surgery with A SIP OF WATER : Bupropion , Macrobid  (if you still are taking on the day of surgery)  You may not have any metal on your body including hair pins, jewelry, and body piercing  Do not wear make-up, lotions, powders, perfumes or deodorant  Do not wear nail polish including gel and S&S, artificial / acrylic nails, or any other type of covering on natural nails including finger and toenails. If you have artificial nails, gel coating, etc., that needs to be removed by a nail salon, Please have this removed prior to surgery. Not doing so may mean that your surgery could be cancelled or delayed if the Surgeon or anesthesia staff feels like they are unable to monitor you safely.   Do not shave 48 hours prior to surgery to avoid nicks in your skin which may contribute to postoperative infections.   Contacts, Hearing Aids, dentures or bridgework may not be worn into surgery. DENTURES WILL BE REMOVED PRIOR TO SURGERY PLEASE DO NOT APPLY "Poly grip" OR ADHESIVES!!!  Patients discharged on the day of surgery will not be allowed to drive home.  Someone NEEDS to stay with you for the first 24 hours after anesthesia.  Do not bring your home medications to the hospital. The Pharmacy will dispense medications listed on your medication list to you during  your admission in the Hospital.  Special Instructions: Bring a copy of your healthcare power of attorney and living will documents the day of surgery, if you wish to have them scanned into your [facility] Records- EPIC  Please read over the following fact sheets you were given: IF YOU HAVE QUESTIONS ABOUT YOUR PRE-OP INSTRUCTIONS, PLEASE CALL [phone].   [facility] - Preparing for Surgery Before surgery, you can play an important role.  Because skin is not sterile, your skin needs to be as free of germs as possible.  You can reduce the number of  germs on your skin by washing with CHG (chlorahexidine gluconate) soap before surgery.  CHG is an antiseptic cleaner which kills germs and bonds with the skin to continue killing germs even after washing. Please DO NOT use if you have an allergy to CHG or antibacterial soaps.  If your skin becomes reddened/irritated stop using the CHG and inform your nurse when you arrive at Short Stay. Do not shave (including legs and underarms) for at least 48 hours prior to the first CHG shower.  You may shave your face/neck.  Please follow these instructions carefully:  1.  Shower with CHG Soap the night before surgery and the  morning of surgery.  2.  If you choose to wash your hair, wash your hair first as usual with your normal  shampoo.  3.  After you shampoo, rinse your hair and body thoroughly to remove the shampoo.                             4.  Use CHG as you would any other liquid soap.  You can apply chg directly to the skin and wash.  Gently with a scrungie or clean washcloth.  5.  Apply the CHG Soap to your body ONLY FROM THE NECK DOWN.   Do not use on face/ open                           Wound or open sores. Avoid contact with eyes, ears mouth and genitals (private parts).                       Wash face,  Genitals (private parts) with your normal soap.             6.  Wash thoroughly, paying special attention to the area where your  surgery  will be performed.  7.  Thoroughly rinse your body with warm water  from the neck down.  8.  DO NOT shower/wash with your normal soap after using and rinsing off the CHG Soap.            9.  Pat yourself dry with a clean towel.            10.  Wear clean pajamas.            11.  Place clean sheets on your bed the night of your first shower and do not  sleep with pets.  ON THE DAY OF SURGERY : Do not apply any lotions/deodorants the morning of surgery.  Please wear clean clothes to the hospital/surgery center.    FAILURE TO FOLLOW THESE INSTRUCTIONS MAY  RESULT IN THE CANCELLATION OF YOUR SURGERY  PATIENT SIGNATURE_________________________________  NURSE SIGNATURE__________________________________  ________________________________________________________________________

## 2024-03-22 NOTE — Progress Notes (Signed)
 COVID Vaccine received:  []  No [x]  Yes Date of any COVID positive Test in last 90 days:  PCP - Laymon Priest, MD   Juel Nutley, MD Cardiologist - Sheryle Donning, MD  (LOV 07-09-23)  Chest x-ray - 10-19-2020  2v  Epic EKG -  07-10-2022 Epic   will repeat  Stress Test -  ECHO - 05-11-2015  Epic Cardiac Cath -  CT cardiac score of 0 on 07-11-2022   Epic  Pacemaker / ICD device [x]  No []  Yes   Spinal Cord Stimulator:[x]  No []  Yes       History of Sleep Apnea? [x]  No []  Yes   CPAP used?- [x]  No []  Yes    Does the patient monitor blood sugar?   []  N/A   []  No []  Yes  Patient has: []  NO Hx DM   [x]  Pre-DM   []  DM1  []   DM2 Last A1c was:  5.9 on 12-25-2021    Recently, she has been Hypoglycemic    Blood Thinner / Instructions:  none Aspirin  Instructions:  ASA 81 mg  ERAS Protocol Ordered: [x]  No  []  Yes PRE-SURGERY []  ENSURE  []  G2   []  No Drink Ordered  Patient is to be NPO after:      Dental hx: []  Dentures:  []  N/A      []  Bridge or Partial:                   []  Loose or Damaged teeth:   Comments:   Activity level: Patient is able / unable to climb a flight of stairs without difficulty; []  No CP  []  No SOB, but would have ___   Patient can / can not perform ADLs without assistance.   Anesthesia review: Pre-DM,  ascending aortic aneurysm, recurrent UTIs, Hx of Phentermine  use.  S/p Roux-En-Y gastric bypass  Patient denies shortness of breath, fever, cough and chest pain at PAT appointment.  Patient verbalized understanding and agreement to the Pre-Surgical Instructions that were given to them at this PAT appointment. Patient was also educated of the need to review these PAT instructions again prior to her surgery.I reviewed the appropriate phone numbers to call if they have any and questions or concerns.

## 2024-03-24 ENCOUNTER — Encounter (HOSPITAL_COMMUNITY): Payer: Self-pay

## 2024-03-24 ENCOUNTER — Other Ambulatory Visit: Payer: Self-pay

## 2024-03-24 ENCOUNTER — Encounter (HOSPITAL_COMMUNITY)
Admission: RE | Admit: 2024-03-24 | Discharge: 2024-03-24 | Disposition: A | Discharge status: Home / Self Care | Source: Ambulatory Visit | Attending: Urology | Admitting: Urology

## 2024-03-24 VITALS — BP 106/62 | HR 60 | Temp 97.9°F | Resp 14 | Ht 62.0 in | Wt 192.0 lb

## 2024-03-24 DIAGNOSIS — R7303 Prediabetes: Secondary | ICD-10-CM | POA: Insufficient documentation

## 2024-03-24 DIAGNOSIS — Z01818 Encounter for other preprocedural examination: Secondary | ICD-10-CM | POA: Insufficient documentation

## 2024-03-24 DIAGNOSIS — T505X5S Adverse effect of appetite depressants, sequela: Secondary | ICD-10-CM | POA: Insufficient documentation

## 2024-03-24 DIAGNOSIS — N136 Pyonephrosis: Secondary | ICD-10-CM | POA: Insufficient documentation

## 2024-03-24 DIAGNOSIS — I700 Atherosclerosis of aorta: Secondary | ICD-10-CM | POA: Insufficient documentation

## 2024-03-24 DIAGNOSIS — F419 Anxiety disorder, unspecified: Secondary | ICD-10-CM | POA: Insufficient documentation

## 2024-03-24 HISTORY — DX: Aneurysm of the ascending aorta, without rupture: I71.21

## 2024-03-24 HISTORY — DX: Prediabetes: R73.03

## 2024-03-24 HISTORY — DX: Adverse effect of appetite depressants, initial encounter: T50.5X5A

## 2024-03-24 HISTORY — DX: Personal history of urinary calculi: Z87.442

## 2024-03-24 LAB — CBC
HCT: 36.9 % (ref 36.0–46.0)
Hemoglobin: 11.7 g/dL — ABNORMAL LOW (ref 12.0–15.0)
MCH: 31.1 pg (ref 26.0–34.0)
MCHC: 31.7 g/dL (ref 30.0–36.0)
MCV: 98.1 fL (ref 80.0–100.0)
Platelets: 135 10*3/uL — ABNORMAL LOW (ref 150–400)
RBC: 3.76 MIL/uL — ABNORMAL LOW (ref 3.87–5.11)
RDW: 12.4 % (ref 11.5–15.5)
WBC: 4.5 10*3/uL (ref 4.0–10.5)
nRBC: 0 % (ref 0.0–0.2)
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
## 2024-03-25 NOTE — Anesthesia Preprocedure Evaluation (Addendum)
 Anesthesia Evaluation  Patient identified by MRN, date of birth, ID band Patient awake and Patient confused    Reviewed: Allergy & Precautions, NPO status , Patient's Chart, lab work & pertinent test results  Airway Mallampati: II  TM Distance: >3 FB     Dental no notable dental hx. (+) Caps, Dental Advisory Given,    Pulmonary shortness of breath and with exertion   Pulmonary exam normal breath sounds clear to auscultation       Cardiovascular Normal cardiovascular exam Rhythm:Regular Rate:Normal  Hx/o exposure to phentermine  Hx/o Ascending aortic aneurysm  Echo 05/11/15 LV EF: 55% -  60%    Neuro/Psych  PSYCHIATRIC DISORDERS Anxiety Depression    negative neurological ROS     GI/Hepatic Neg liver ROS,GERD  ,,Hx/o gastric bypass   Endo/Other  negative endocrine ROS  Prediabetes  Renal/GU Renal diseaseGross hematuria Right hydronephrosis Hx/o renal calculi Bladder dysfunction  Urinary incontinence    Musculoskeletal negative musculoskeletal ROS (+)    Abdominal  (+) + obese  Peds  Hematology   Anesthesia Other Findings   Reproductive/Obstetrics                             Anesthesia Physical Anesthesia Plan  ASA: 2  Anesthesia Plan: General   Post-op Pain Management: Minimal or no pain anticipated and Dilaudid  IV   Induction: Intravenous  PONV Risk Score and Plan: 4 or greater and Treatment may vary due to age or medical condition, Midazolam , Ondansetron  and Dexamethasone   Airway Management Planned: Oral ETT  Additional Equipment: None  Intra-op Plan:   Post-operative Plan: Extubation in OR  Informed Consent: I have reviewed the patients History and Physical, chart, labs and discussed the procedure including the risks, benefits and alternatives for the proposed anesthesia with the patient or authorized representative who has indicated his/her understanding and  acceptance.     Dental advisory given  Plan Discussed with: CRNA and Anesthesiologist  Anesthesia Plan Comments: (See PAT note from 5/6)        Anesthesia Quick Evaluation

## 2024-03-30 ENCOUNTER — Encounter (HOSPITAL_COMMUNITY): Payer: Self-pay | Admitting: Urology

## 2024-03-30 ENCOUNTER — Encounter (HOSPITAL_COMMUNITY)
Admission: RE | Disposition: A | Discharge status: Home / Self Care | Payer: Self-pay | Source: Home / Self Care | Attending: Urology

## 2024-03-30 ENCOUNTER — Ambulatory Visit (HOSPITAL_COMMUNITY): Payer: Self-pay | Admitting: Medical

## 2024-03-30 ENCOUNTER — Ambulatory Visit (HOSPITAL_COMMUNITY)

## 2024-03-30 ENCOUNTER — Ambulatory Visit (HOSPITAL_COMMUNITY): Admitting: Anesthesiology

## 2024-03-30 ENCOUNTER — Ambulatory Visit (HOSPITAL_COMMUNITY)
Admission: RE | Admit: 2024-03-30 | Discharge: 2024-03-30 | Disposition: A | Discharge status: Home / Self Care | Attending: Urology | Admitting: Urology

## 2024-03-30 ENCOUNTER — Other Ambulatory Visit: Payer: Self-pay

## 2024-03-30 DIAGNOSIS — N3021 Other chronic cystitis with hematuria: Secondary | ICD-10-CM | POA: Diagnosis not present

## 2024-03-30 DIAGNOSIS — R7303 Prediabetes: Secondary | ICD-10-CM | POA: Diagnosis not present

## 2024-03-30 DIAGNOSIS — N952 Postmenopausal atrophic vaginitis: Secondary | ICD-10-CM | POA: Insufficient documentation

## 2024-03-30 DIAGNOSIS — K219 Gastro-esophageal reflux disease without esophagitis: Secondary | ICD-10-CM | POA: Insufficient documentation

## 2024-03-30 DIAGNOSIS — D494 Neoplasm of unspecified behavior of bladder: Secondary | ICD-10-CM | POA: Diagnosis not present

## 2024-03-30 DIAGNOSIS — Z87440 Personal history of urinary (tract) infections: Secondary | ICD-10-CM | POA: Diagnosis not present

## 2024-03-30 DIAGNOSIS — Z87442 Personal history of urinary calculi: Secondary | ICD-10-CM | POA: Insufficient documentation

## 2024-03-30 DIAGNOSIS — Z9884 Bariatric surgery status: Secondary | ICD-10-CM | POA: Diagnosis not present

## 2024-03-30 DIAGNOSIS — R32 Unspecified urinary incontinence: Secondary | ICD-10-CM | POA: Diagnosis not present

## 2024-03-30 DIAGNOSIS — N1330 Unspecified hydronephrosis: Secondary | ICD-10-CM | POA: Insufficient documentation

## 2024-03-30 HISTORY — PX: CYSTOSCOPY WITH BIOPSY: SHX5122

## 2024-03-30 HISTORY — PX: CYSTOSCOPY/RETROGRADE/URETEROSCOPY: SHX5316

## 2024-03-30 SURGERY — CYSTOSCOPY/RETROGRADE/URETEROSCOPY
Anesthesia: General | Laterality: Right

## 2024-03-30 MED ORDER — MIDAZOLAM HCL 5 MG/5ML IJ SOLN
INTRAMUSCULAR | Status: DC | PRN
  Administered 2024-03-30: 2 mg via INTRAVENOUS

## 2024-03-30 MED ORDER — OXYCODONE HCL 5 MG PO TABS
ORAL_TABLET | ORAL | Status: AC
  Filled 2024-03-30: qty 1

## 2024-03-30 MED ORDER — FENTANYL CITRATE (PF) 100 MCG/2ML IJ SOLN
INTRAMUSCULAR | Status: AC
  Filled 2024-03-30: qty 2

## 2024-03-30 MED ORDER — ORAL CARE MOUTH RINSE: 15.0000 mL | Freq: Once | OROMUCOSAL | Status: AC

## 2024-03-30 MED ORDER — LACTATED RINGERS IV SOLN
INTRAVENOUS | Status: DC
Start: 2024-03-30 — End: 2024-03-30

## 2024-03-30 MED ORDER — ONDANSETRON HCL 4 MG/2ML IJ SOLN
INTRAMUSCULAR | Status: DC | PRN
  Administered 2024-03-30: 4 mg via INTRAVENOUS

## 2024-03-30 MED ORDER — DROPERIDOL 2.5 MG/ML IJ SOLN: 0.6250 mg | Freq: Once | INTRAMUSCULAR | Status: DC | PRN

## 2024-03-30 MED ORDER — PHENYLEPHRINE 80 MCG/ML (10ML) SYRINGE FOR IV PUSH (FOR BLOOD PRESSURE SUPPORT)
PREFILLED_SYRINGE | INTRAVENOUS | Status: DC | PRN
  Administered 2024-03-30 (×2): 160 ug via INTRAVENOUS

## 2024-03-30 MED ORDER — OXYCODONE HCL 5 MG/5ML PO SOLN: 5.0000 mg | Freq: Once | ORAL | Status: AC | PRN

## 2024-03-30 MED ORDER — STERILE WATER FOR IRRIGATION IR SOLN
Status: DC | PRN
  Administered 2024-03-30: 3000 mL via INTRAVESICAL

## 2024-03-30 MED ORDER — PROPOFOL 10 MG/ML IV BOLUS
INTRAVENOUS | Status: DC | PRN
  Administered 2024-03-30: 140 mg via INTRAVENOUS

## 2024-03-30 MED ORDER — FENTANYL CITRATE (PF) 100 MCG/2ML IJ SOLN
INTRAMUSCULAR | Status: DC | PRN
  Administered 2024-03-30: 100 ug via INTRAVENOUS

## 2024-03-30 MED ORDER — OXYCODONE HCL 5 MG PO TABS
5.0000 mg | ORAL_TABLET | Freq: Once | ORAL | Status: AC | PRN
  Administered 2024-03-30: 5 mg via ORAL

## 2024-03-30 MED ORDER — FENTANYL CITRATE PF 50 MCG/ML IJ SOSY
PREFILLED_SYRINGE | INTRAMUSCULAR | Status: AC
  Filled 2024-03-30: qty 1

## 2024-03-30 MED ORDER — HYDROCODONE-ACETAMINOPHEN 5-325 MG PO TABS: 1.0000 | ORAL_TABLET | Freq: Four times a day (QID) | ORAL | 0 refills | Status: DC | PRN

## 2024-03-30 MED ORDER — IOHEXOL 300 MG/ML  SOLN
INTRAMUSCULAR | Status: DC | PRN
  Administered 2024-03-30: 10 mL

## 2024-03-30 MED ORDER — ONDANSETRON HCL 4 MG/2ML IJ SOLN: 4.0000 mg | Freq: Once | INTRAMUSCULAR | Status: DC | PRN

## 2024-03-30 MED ORDER — SUGAMMADEX SODIUM 200 MG/2ML IV SOLN
INTRAVENOUS | Status: DC | PRN
  Administered 2024-03-30: 200 mg via INTRAVENOUS

## 2024-03-30 MED ORDER — PROPOFOL 10 MG/ML IV BOLUS
INTRAVENOUS | Status: AC
  Filled 2024-03-30: qty 20

## 2024-03-30 MED ORDER — CHLORHEXIDINE GLUCONATE 0.12 % MT SOLN
15.0000 mL | Freq: Once | OROMUCOSAL | Status: AC
  Administered 2024-03-30: 15 mL via OROMUCOSAL

## 2024-03-30 MED ORDER — ROCURONIUM BROMIDE 100 MG/10ML IV SOLN
INTRAVENOUS | Status: DC | PRN
  Administered 2024-03-30: 50 mg via INTRAVENOUS

## 2024-03-30 MED ORDER — PHENYLEPHRINE 80 MCG/ML (10ML) SYRINGE FOR IV PUSH (FOR BLOOD PRESSURE SUPPORT)
PREFILLED_SYRINGE | INTRAVENOUS | Status: AC
  Filled 2024-03-30: qty 10

## 2024-03-30 MED ORDER — FENTANYL CITRATE PF 50 MCG/ML IJ SOSY
25.0000 ug | PREFILLED_SYRINGE | INTRAMUSCULAR | Status: DC | PRN
  Administered 2024-03-30 (×2): 25 ug via INTRAVENOUS

## 2024-03-30 MED ORDER — DEXAMETHASONE SODIUM PHOSPHATE 10 MG/ML IJ SOLN
INTRAMUSCULAR | Status: DC | PRN
  Administered 2024-03-30: 8 mg via INTRAVENOUS

## 2024-03-30 MED ORDER — LACTATED RINGERS IV SOLN: INTRAVENOUS | Status: DC | PRN

## 2024-03-30 MED ORDER — CEFAZOLIN SODIUM-DEXTROSE 2-4 GM/100ML-% IV SOLN
2.0000 g | INTRAVENOUS | Status: AC
  Administered 2024-03-30: 2 g via INTRAVENOUS
  Filled 2024-03-30: qty 100

## 2024-03-30 MED ORDER — MIDAZOLAM HCL 2 MG/2ML IJ SOLN
INTRAMUSCULAR | Status: AC
  Filled 2024-03-30: qty 2

## 2024-03-30 MED ORDER — LIDOCAINE HCL (CARDIAC) PF 100 MG/5ML IV SOSY
PREFILLED_SYRINGE | INTRAVENOUS | Status: DC | PRN
  Administered 2024-03-30: 100 mg via INTRAVENOUS

## 2024-03-30 SURGICAL SUPPLY — 29 items
BAG URINE DRAIN 2000ML AR STRL (UROLOGICAL SUPPLIES) IMPLANT
BAG URO CATCHER STRL LF (MISCELLANEOUS) ×3 IMPLANT
BASKET LASER NITINOL 1.9FR (BASKET) IMPLANT
BASKET ZERO TIP NITINOL 2.4FR (BASKET) IMPLANT
CATH FOLEY 2WAY SLVR 5CC 16FR (CATHETERS) IMPLANT
CATH FOLEY 2WAY SLVR 5CC 18FR (CATHETERS) IMPLANT
CATH URETERAL DUAL LUMEN 10F (MISCELLANEOUS) IMPLANT
CATH URETL OPEN END 6FR 70 (CATHETERS) ×3 IMPLANT
CLOTH BEACON ORANGE TIMEOUT ST (SAFETY) ×3 IMPLANT
DRAPE FOOT SWITCH (DRAPES) ×3 IMPLANT
ELECT REM PT RETURN 15FT ADLT (MISCELLANEOUS) ×3 IMPLANT
EXTRACTOR STONE 1.7FRX115CM (UROLOGICAL SUPPLIES) IMPLANT
GLOVE BIO SURGEON STRL SZ7.5 (GLOVE) ×3 IMPLANT
GOWN STRL REUS W/ TWL XL LVL3 (GOWN DISPOSABLE) ×3 IMPLANT
GUIDEWIRE ANG ZIPWIRE 038X150 (WIRE) IMPLANT
GUIDEWIRE STR DUAL SENSOR (WIRE) ×3 IMPLANT
KIT TURNOVER KIT A (KITS) IMPLANT
LASER FIB FLEXIVA PULSE ID 365 (Laser) IMPLANT
LOOP CUT BIPOLAR 24F LRG (ELECTROSURGICAL) IMPLANT
MANIFOLD NEPTUNE II (INSTRUMENTS) ×3 IMPLANT
PACK CYSTO (CUSTOM PROCEDURE TRAY) ×3 IMPLANT
PLUG CATH AND CAP STRL 200 (CATHETERS) IMPLANT
SHEATH NAVIGATOR HD 11/13X28 (SHEATH) IMPLANT
SHEATH NAVIGATOR HD 11/13X36 (SHEATH) IMPLANT
STENT URET 6FRX24 CONTOUR (STENTS) IMPLANT
SYRINGE TOOMEY IRRIG 70ML (MISCELLANEOUS) IMPLANT
TRACTIP FLEXIVA PULS ID 200XHI (Laser) IMPLANT
TUBING CONNECTING 10 (TUBING) ×3 IMPLANT
TUBING UROLOGY SET (TUBING) ×3 IMPLANT

## 2024-03-30 NOTE — Anesthesia Postprocedure Evaluation (Signed)
 Anesthesia Post Note  Patient: Mikayla [name]  Procedure(s) Performed: CYSTOSCOPY/RETROGRADE/URETEROSCOPY (Right) CYSTOSCOPY, WITH BIOPSY     Patient location during evaluation: PACU Anesthesia Type: General Level of consciousness: awake and alert and oriented Pain management: pain level controlled Vital Signs Assessment: post-procedure vital signs reviewed and stable Respiratory status: spontaneous breathing, nonlabored ventilation and respiratory function stable Cardiovascular status: blood pressure returned to baseline and stable Postop Assessment: no apparent nausea or vomiting Anesthetic complications: no   No notable events documented.  Last Vitals:  Vitals:   03/30/24 1630 03/30/24 1645  BP: 139/72 128/71  Pulse: 60 (!) 58  Resp: (!) 22 15  Temp:    SpO2: 100% 97%    Last Pain:  Vitals:   03/30/24 1645  TempSrc:   PainSc: 5                  Milos Milligan A.

## 2024-03-30 NOTE — H&P (Signed)
 CC/HPI: CC: Recurrent UTI, right hydronephrosis  HPI:  02/27/2024  58 year old female with a history of recurrent UTI. She had a sling in 2010 and cystoscopy with bladder biopsy in 2014. She also has had a mesh kit prolapse repair and hysterectomy. She has had UTI's in the past. She has some vaginal dryness and pain with intercourse. She was last seen a couple of years ago here and was started on topical vaginal estrogen. She quit using that a couple months ago and her UTIs started back. She states that the Estrace  was causing a burning sensation in her vagina. Her urine cultures have been positive for pansensitive E. coli. She is currently on ciprofloxacin . She has had gross hematuria and underwent a CT hematuria protocol that revealed thickening in the right renal pelvis and bladder consistent with possible cystitis. There was no abscess. She had mild right hydronephrosis. She continues to have intermittent pain and discomfort as well as increased urgency, frequency.   03/30/2024 Patient presents today for diagnostic cystoscopy with bilateral retrograde pyelogram and right ureteroscopy with possible stent.    ALLERGIES: Codeine - Itching    MEDICATIONS: Cipro  500 MG Tablet 1 tablet PO Daily  Furosemide  20 MG Tablet  metroNIDAZOLE 0.75 % Gel 1 applicatorful at bedtime for a week  Premarin  0.625 MG/GM Cream 0.5gm (pea sized amount) vaginally at bedtime 2-3 x weekly     GU PSH: Cysto Bladder Ureth Biopsy - 2014, 2011 Hysterectomy Unilat SO - 2010 Sling - 2010     NON-GU PSH: None   GU PMH: Incomplete bladder emptying, Mildly elevate PVR. - 2022 Personal Hx Urinary Tract Infections, She has recurrent UTI's with with atrophic vaginitis and incomplete bladder emptying. - 2022 Postmenopausal atrophic vaginitis (Stable), I am going to have her start topical estrogen and reviewed the side effects and instructions . I will also give her metrogel for the odor and mild vaginal discharge. . - 2022,  Atrophic vaginitis, - 2014 Urge incontinence, She has urgency and UUI primarily at night. She has minimal SUI. She has had a prior sling which can cause relative obstruction and urgency but she also had had issues with weight gain and fluid overload requiring a diuretic and does better when she takes the diuretic. I have recommended she stick with that and try to lose weight. I would prefer to avoid an OAB med at this point. - 2022 Dysuria (Stable) - 2017, Dysuria, - 2015 Chronic cystitis (w/o hematuria), Chronic cystitis - 2015 Acute Cystitis/UTI, Acute cystitis without hematuria - 2014 Bladder disorder, Unspec, Bladder disorder - 2014 Cystocele, Unspec, Cystocele - 2014 Mixed incontinence, Urge And Stress Incontinence - 2014 Nocturia, Nocturia - 2014 Pelvic/perineal pain, Female pelvic pain - 2014 Rectocele, Rectocele - 2014 Urinary Tract Inf, Unspec site, Pyuria - 2014      PMH Notes:  2009-03-23 10:00:57 - Note: No Medical Problems   NON-GU PMH: Encounter for general adult medical examination without abnormal findings, Encounter for preventive health examination - 2015 Muscle weakness (generalized), Muscle weakness - 2014 Other lack of coordination, Other lack of coordination - 2014 Other muscle spasm, Muscle spasm - 2014 Unspecified dyspareunia, Dyspareunia, female - 2014 Hypercholesterolemia    FAMILY HISTORY: Death In The Family Father - Runs In Family Death In The Family Mother - Runs In Family Family Health Status Number - Runs In Family Heart Disease - Father Lung Cancer - Mother   SOCIAL HISTORY: Marital Status: Married Preferred Language: English; Ethnicity: Not Hispanic Or Latino; Race: White Current  Smoking Status: Patient has never smoked.  Has never drank.  Drinks 2 caffeinated drinks per day. Patient's occupation Theatre stage manager.     Notes: 4 sons 1 daughter   REVIEW OF SYSTEMS:    GU Review Female:   Patient reports frequent urination, hard to postpone  urination, burning /pain with urination, and get up at night to urinate. Patient denies leakage of urine, stream starts and stops, trouble starting your stream, have to strain to urinate, and being pregnant.  Gastrointestinal (Upper):   Patient denies nausea, vomiting, and indigestion/ heartburn.  Gastrointestinal (Lower):   Patient denies diarrhea and constipation.  Constitutional:   Patient denies fever, night sweats, weight loss, and fatigue.  Skin:   Patient denies skin rash/ lesion and itching.  Eyes:   Patient denies blurred vision and double vision.  Ears/ Nose/ Throat:   Patient denies sore throat and sinus problems.  Hematologic/Lymphatic:   Patient denies swollen glands and easy bruising.  Cardiovascular:   Patient denies leg swelling and chest pains.  Respiratory:   Patient denies cough and shortness of breath.  Endocrine:   Patient denies excessive thirst.  Musculoskeletal:   Patient denies back pain and joint pain.  Neurological:   Patient denies headaches and dizziness.  Psychologic:   Patient denies depression and anxiety.   VITAL SIGNS: None   MULTI-SYSTEM PHYSICAL EXAMINATION:    Constitutional: Well-nourished. No physical deformities. Normally developed. Good grooming.  Respiratory: No labored breathing, no use of accessory muscles.   Gastrointestinal: Obese abdomen. No mass, no tenderness, no rigidity. No CVA tenderness bilaterally  Eyes: Normal conjunctivae. Normal eyelids.  Musculoskeletal: Normal gait and station of head and neck.   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

## 2024-03-30 NOTE — Transfer of Care (Signed)
 Immediate Anesthesia Transfer of Care Note  Patient: Mikayla [name]  Procedure(s) Performed: CYSTOSCOPY/RETROGRADE/URETEROSCOPY (Right) CYSTOSCOPY, WITH BIOPSY  Patient Location: PACU  Anesthesia Type:General  Level of Consciousness: awake and alert   Airway & Oxygen Therapy: Patient Spontanous Breathing and Patient connected to face mask oxygen  Post-op Assessment: Report given to RN and Post -op Vital signs reviewed and stable  Post vital signs: Reviewed and stable  Last Vitals:  Vitals Value Taken Time  BP    Temp    Pulse 65 03/30/24 1625  Resp 22 03/30/24 1625  SpO2 100 % 03/30/24 1625  Vitals shown include unfiled device data.  Last Pain:  Vitals:   03/30/24 1212  TempSrc: Oral         Complications: No notable events documented.

## 2024-03-30 NOTE — Op Note (Signed)
 Operative Note  Preoperative diagnosis:  1.  Chronic cystitis 2.  Right hydronephrosis 3.  Gross hematuria  Postoperative diagnosis: 1.  Chronic cystitis 2.  Bladder tumor--small 3.  Nonobstructive right hydronephrosis 4.  Gross hematuria  Procedure(s): 1.  Cystoscopy with bladder biopsy and fulguration of small bladder tumor/lesion 2.  Bilateral retrograde pyelogram 3.  Right diagnostic ureteroscopy and stent placement  Surgeon: Leila Punt, MD  Assistants: None  Anesthesia: General  Complications: None immediate  EBL: Minimal  Specimens: 1.  Bladder tumor  Drains/Catheters: 1.  6 x 24 double-J ureteral stent 2.  16 French Foley catheter  Intraoperative findings: 1.  Normal urethra without any evidence of sling erosion 2.  Bladder mucosa with a single area of slightly raised erythema on the right anterior wall towards the dome that measured 0.5 to 1 cm  3.  Left retrograde pyelogram without any filling defect or hydronephrosis  4.  Mild hydronephrosis/fullness of the right renal pelvis.  A little bit of narrowing at the distal ureter but diagnostic ureteroscopy did not show a stricture.  No filling defect otherwise.  5.  Diagnostic right ureteroscopy revealed no tumor stone or stricture  Indication: 58 year old female with a history of chronic cystitis/recurrent UTI.  She continues with the chronic burning sensation and had an episode of gross hematuria as well.  CT hematuria protocol revealed thickening of the right renal pelvis and bladder and mild right hydronephrosis.  She presents for the previously mentioned operation.  Description of procedure:  The patient was identified and consent was obtained.  The patient was taken to the operating room and placed in the supine position.  The patient was placed under general anesthesia.  Perioperative antibiotics were administered.  The patient was placed in dorsal lithotomy.  Patient was prepped and draped in a standard  sterile fashion and a timeout was performed.  A 21 French rigid cystoscope was advanced into the urethra and into the bladder.  Complete cystoscopy was performed with findings noted above.  Cold cup biopsies were used to take 2 biopsies of the area of erythema and passed off for specimen.  I fulgurated it with a Bugbee.  Total area of fulguration approximately 1 cm.  Biopsy was a little bit deep and towards the dome so decided to leave a Foley catheter at the end.  I turned my attention to the left ureteral orifice and intubated with an open-ended ureteral catheter.  Retrograde pyelogram was performed with no abnormal findings.  I inspected the right ureteral orifice and shot a retrograde pyelogram on the right with findings noted above.  I advanced a sensor wire up the right ureter and into the kidney under fluoroscopic guidance.  Semirigid ureteroscopy was performed alongside the wire and no abnormalities were found up to the level of the renal pelvis.  I withdrew the scope and advanced a digital ureteroscope over the wire under continuous fluoroscopic guidance and direct visualization and into the kidney.  Wire was withdrawn and I inspected the entire kidney.  There was some evidence of hydronephrosis but no tumors or stones.  No abnormalities otherwise noted.  I withdrew the scope visualizing the ureter upon removal.  There were again no ureteral abnormalities noted.    I advanced the cystoscope back into the bladder and advanced a sensor wire up the right ureter and into the kidney under fluoroscopic guidance.  I withdrew the scope and placed a 6 x 24 double-J ureteral stent in the standard fashion over the wire  followed by removal of the wire.  String was in place.  This was tucked into the vagina.  I elected to leave a Foley catheter and placed that.  This concluded the operation.  Patient tolerated the procedure well was stable postoperatively.  Plan: She will continue stent and Foley catheter for  3 to 4 days and then both can be removed.  I will see about getting her an appointment in the office to get those removed this week.

## 2024-03-30 NOTE — Anesthesia Procedure Notes (Signed)
 Procedure Name: Intubation Date/Time: 03/30/2024 3:41 PM  Performed by: Josetta Niece, CRNAPre-anesthesia Checklist: Patient identified, Emergency Drugs available, Suction available and Patient being monitored Patient Re-evaluated:Patient Re-evaluated prior to induction Oxygen Delivery Method: Circle System Utilized Preoxygenation: Pre-oxygenation with 100% oxygen Induction Type: IV induction Ventilation: Mask ventilation without difficulty Laryngoscope Size: Mac and 3 Tube type: Oral Tube size: 7.0 mm Number of attempts: 1 Airway Equipment and Method: Stylet Placement Confirmation: ETT inserted through vocal cords under direct vision, positive ETCO2 and breath sounds checked- equal and bilateral Secured at: 21 cm Tube secured with: Tape Dental Injury: Teeth and Oropharynx as per pre-operative assessment

## 2024-03-30 NOTE — Discharge Instructions (Addendum)

## 2024-03-31 ENCOUNTER — Encounter (HOSPITAL_COMMUNITY): Payer: Self-pay | Admitting: Urology

## 2024-04-01 LAB — SURGICAL PATHOLOGY

## 2024-04-02 DIAGNOSIS — N3020 Other chronic cystitis without hematuria: Secondary | ICD-10-CM | POA: Diagnosis not present

## 2024-04-02 DIAGNOSIS — N952 Postmenopausal atrophic vaginitis: Secondary | ICD-10-CM | POA: Diagnosis not present

## 2024-04-02 DIAGNOSIS — N130 Hydronephrosis with ureteropelvic junction obstruction: Secondary | ICD-10-CM | POA: Diagnosis not present

## 2024-04-02 DIAGNOSIS — N23 Unspecified renal colic: Secondary | ICD-10-CM | POA: Diagnosis not present

## 2024-04-30 DIAGNOSIS — K5900 Constipation, unspecified: Secondary | ICD-10-CM | POA: Diagnosis not present

## 2024-04-30 DIAGNOSIS — Z6837 Body mass index (BMI) 37.0-37.9, adult: Secondary | ICD-10-CM | POA: Diagnosis not present

## 2024-04-30 DIAGNOSIS — Z9884 Bariatric surgery status: Secondary | ICD-10-CM | POA: Diagnosis not present

## 2024-06-03 ENCOUNTER — Other Ambulatory Visit (HOSPITAL_COMMUNITY): Payer: Self-pay

## 2024-06-03 MED ORDER — BUPROPION HCL ER (XL) 300 MG PO TB24: 300.0000 mg | ORAL_TABLET | Freq: Every day | ORAL | 1 refills | Status: DC

## 2024-06-03 MED ORDER — ROSUVASTATIN CALCIUM 5 MG PO TABS: 5.0000 mg | ORAL_TABLET | Freq: Every day | ORAL | 3 refills | Status: DC

## 2024-06-03 MED ORDER — GEMTESA 75 MG PO TABS: 75.0000 mg | ORAL_TABLET | Freq: Every day | ORAL | 3 refills | Status: DC

## 2024-06-03 MED ORDER — ESTROGENS CONJUGATED 0.625 MG/GM VA CREA: TOPICAL_CREAM | VAGINAL | 3 refills | Status: AC

## 2024-06-03 MED ORDER — NITROFURANTOIN MONOHYD MACRO 100 MG PO CAPS
100.0000 mg | ORAL_CAPSULE | Freq: Every day | ORAL | 3 refills | Status: DC
  Filled 2024-06-04: qty 30, 30d supply, fill #0

## 2024-06-04 ENCOUNTER — Other Ambulatory Visit (HOSPITAL_COMMUNITY): Payer: Self-pay

## 2024-06-05 ENCOUNTER — Other Ambulatory Visit (HOSPITAL_COMMUNITY): Payer: Self-pay

## 2024-06-05 DIAGNOSIS — E669 Obesity, unspecified: Secondary | ICD-10-CM | POA: Diagnosis not present

## 2024-06-05 DIAGNOSIS — Z9884 Bariatric surgery status: Secondary | ICD-10-CM | POA: Diagnosis not present

## 2024-06-10 ENCOUNTER — Ambulatory Visit: Admitting: Family Medicine

## 2024-06-10 ENCOUNTER — Ambulatory Visit (INDEPENDENT_AMBULATORY_CARE_PROVIDER_SITE_OTHER)

## 2024-06-10 VITALS — BP 96/58 | HR 67 | Ht 62.0 in | Wt 200.0 lb

## 2024-06-10 DIAGNOSIS — M48061 Spinal stenosis, lumbar region without neurogenic claudication: Secondary | ICD-10-CM | POA: Diagnosis not present

## 2024-06-10 DIAGNOSIS — G8929 Other chronic pain: Secondary | ICD-10-CM

## 2024-06-10 DIAGNOSIS — M5126 Other intervertebral disc displacement, lumbar region: Secondary | ICD-10-CM | POA: Diagnosis not present

## 2024-06-10 DIAGNOSIS — M5441 Lumbago with sciatica, right side: Secondary | ICD-10-CM

## 2024-06-10 DIAGNOSIS — M47816 Spondylosis without myelopathy or radiculopathy, lumbar region: Secondary | ICD-10-CM | POA: Diagnosis not present

## 2024-06-10 DIAGNOSIS — G5701 Lesion of sciatic nerve, right lower limb: Secondary | ICD-10-CM | POA: Diagnosis not present

## 2024-06-10 MED ORDER — PREDNISONE 50 MG PO TABS
ORAL_TABLET | ORAL | 0 refills | Status: DC
Start: 2024-06-10 — End: 2024-10-04

## 2024-06-10 MED ORDER — GABAPENTIN 100 MG PO CAPS: 100.0000 mg | ORAL_CAPSULE | Freq: Three times a day (TID) | ORAL | 3 refills | Status: DC | PRN

## 2024-06-10 NOTE — Patient Instructions (Signed)
 Thank you for coming in today.   I've referred you to Physical Therapy.  Let us  know if you don't hear from them in one week.   Please get an Xray today before you leave   I've sent a prescription for Prednisone  & Gabapentin  to your pharmacy.   You received an injection today. Seek immediate medical attention if the joint becomes red, extremely painful, or is oozing fluid.   Try taking over the counter omeprazole  Check back in 2 months.

## 2024-06-10 NOTE — Progress Notes (Signed)
 LILLETTE Ileana Collet, PhD, LAT, ATC acting as a scribe for Artist Lloyd, MD.  Mikayla [name] is a 58 y.o. female who presents to [facility] at [facility] today for LBP. Pt was last seen by Dr. Lloyd on 04/12/21 for L-sided low back/buttock pain   Today, pt reports LBP ongoing since the weekend. She spent the holiday in the pool all day and then 10 days ago, her son was standing on her feet and she was trying to walk. Pt locates pain to both sides of her low back, R>L.   Radiating pain: yes- R leg, posterior aspect entire leg to toes, L side only slightly into posterior-proximal thigh. LE numbness/tingling: yes LE weakness: yes Aggravates: any activity, laying down Treatments tried: patches, warm baths, Tylenol   Dx imaging: 04/09/21 L-spine MRI  03/31/21 L-spine XR  Pertinent review of systems: No fevers or chills  Relevant historical information: Roux-en-Y bypass history. History of suspected piriformis syndrome treated with greater trochanter injection in the past.  Exam:  BP (!) 96/58   Pulse 67   Ht 5' 2 (1.575 m)   Wt 200 lb (90.7 kg)   SpO2 97%   BMI 36.58 kg/m  General: Well Developed, well nourished, and in no acute distress.   MSK: L-spine: Normal appearing Nontender palpation of spinal midline. Decreased lumbar motion. Lower extremity strength is intact bilaterally.  Reflexes are intact.    Lab and Radiology Results  Hip greater trochanteric injection: Right Consent obtained and timeout performed. Area of maximum tenderness palpated and identified. Skin cleaned with alcohol, cold spray applied. A 22-gauge needle was used to access the greater trochanteric bursa. 40mg  of Kenalog  and 2 mL of Marcaine were used to inject the trochanteric bursa. Patient tolerated the procedure well.  X-ray images lumbar spine obtained today personally and independently interpreted. 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  Mild degenerative changes lower portion lumbar spine  especially prominent at L5-S1.  No acute fractures are visible. Await formal radiology review     Assessment and Plan: 58 y.o. female with right low back pain radiating down the right leg.  Clinically this could be either sciatica or piriformis syndrome.  She did have an MRI a few years ago that did not support sciatica and did improve with a greater trochanter injection.  Plan to proceed to greater trochanter bursa injection today similar to what we did a few years ago.  Additional refer to PT.  I have prescribed prednisone  and gabapentin  that she can start taking tomorrow as well.  If not improved after PT consider MRI lumbar spine.   PDMP not reviewed this encounter. Orders Placed This Encounter  Procedures   DG Lumbar Spine 2-3 Views    Standing Status:   Future    Number of Occurrences:   1    Expiration Date:   07/11/2024    Reason for Exam (SYMPTOM  OR DIAGNOSIS REQUIRED):   low back pain    Preferred imaging location?:   [place]    Is patient pregnant?:   No   Ambulatory referral to Physical Therapy    Referral Priority:   Routine    Referral Type:   Physical Medicine    Referral Reason:   Specialty Services Required    Requested Specialty:   Physical Therapy    Number of Visits Requested:   1   Meds ordered this encounter  Medications   predniSONE  (DELTASONE ) 50 MG tablet    Sig: Take 1 pill  daily for 5 days    Dispense:  5 tablet    Refill:  0   gabapentin  (NEURONTIN ) 100 MG capsule    Sig: Take 1-3 capsules (100-300 mg total) by mouth 3 (three) times daily as needed.    Dispense:  30 capsule    Refill:  3     Discussed warning signs or symptoms. Please see discharge instructions. Patient expresses understanding.   The above documentation has been reviewed and is accurate and complete Artist Lloyd, M.D.

## 2024-06-16 ENCOUNTER — Ambulatory Visit: Payer: Self-pay | Admitting: Family Medicine

## 2024-06-16 NOTE — Progress Notes (Signed)
Lumbar spine x-ray shows some arthritis changes at the base of the spine.

## 2024-06-22 ENCOUNTER — Other Ambulatory Visit (HOSPITAL_BASED_OUTPATIENT_CLINIC_OR_DEPARTMENT_OTHER): Payer: BC Managed Care – PPO

## 2024-06-24 DIAGNOSIS — Z9884 Bariatric surgery status: Secondary | ICD-10-CM | POA: Diagnosis not present

## 2024-06-24 DIAGNOSIS — K449 Diaphragmatic hernia without obstruction or gangrene: Secondary | ICD-10-CM | POA: Diagnosis not present

## 2024-06-24 DIAGNOSIS — R635 Abnormal weight gain: Secondary | ICD-10-CM | POA: Diagnosis not present

## 2024-07-02 DIAGNOSIS — K5900 Constipation, unspecified: Secondary | ICD-10-CM | POA: Diagnosis not present

## 2024-07-02 DIAGNOSIS — Z6837 Body mass index (BMI) 37.0-37.9, adult: Secondary | ICD-10-CM | POA: Diagnosis not present

## 2024-07-02 DIAGNOSIS — E66812 Obesity, class 2: Secondary | ICD-10-CM | POA: Diagnosis not present

## 2024-07-02 DIAGNOSIS — Z9884 Bariatric surgery status: Secondary | ICD-10-CM | POA: Diagnosis not present

## 2024-07-02 NOTE — Progress Notes (Signed)
 Chief Complaint  Patient presents with  . Follow-up    Date of Surgery: 09/13/2022 Type of Surgery: Roux-en Y gastric bypass surgery - [facility] (R100, BP 100)  Initial Weight: 298 Last Weight: 198 Today's Weight: 201  History of Present Illness The patient is here in follow-up after an upper GI procedure.  She has been working with [facility] to adjust her diet and is aiming to lose an additional 20 pounds, with a target weight of 180 pounds. Her physical activities include gardening, camping, and walking, but she does not engage in strenuous exercises like running on a treadmill. She missed her appointment with Dr. Darilyn in 05/2024.  She has improved her vitamin intake, including iron supplements, but has noticed an increase in constipation.  She has been dealing with a urinary tract infection (UTI) since 02/2024 or 03/2024 and is currently on daily antibiotics. She reports that missing a dose results in a significant worsening of her condition. She also mentions that her urine has a strong odor.  PAST SURGICAL HISTORY: Upper GI procedure  SOCIAL HISTORY Exercise: Gardening, camping, walking frequently.    Patient Active Problem List   Diagnosis Date Noted  . Class 2 severe obesity due to excess calories with serious comorbidity and body mass index (BMI) of 37.0 to 37.9 in adult (CMD) 04/30/2024  . Abnormal weight gain 04/30/2024  . 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 Ear itch 12/19/2018  . Impacted cerumen of left ear 07/08/2018  . Acute irritant otitis externa, right 07/08/2018   Medical History[1]  Surgical History[2]  Allergies[3] Current Medications[4] Social History[5]  Family History[6]   Objective: Blood pressure 108/72, pulse 76, height 1.556 m (5' 1.25), weight 91.2 kg (201 lb), SpO2 96%. Body mass index is 37.67 kg/m.  GENERAL: in no apparent distress, well developed and well nourished, and non-toxic HEAD:Normocephalic.  Atraumatic. PSYCH: oriented to time, place and person, mood and affect are appropriate, pt is a good historian; no memory problems were noted   Data Review:   No results found for: WBC, HGB, HCT, PLT, CHOL, TRIG, HDL, LDLDIRECT, ALT, AST, NA, K, CL, CREATININE, BUN, CO2, TSH, PSA, INR, GLUF, HGBA1C, MICROALB  Reviewed labs from  [facility] and [facility] via portal.  Diagnostic studies reviewed include: A UGI/SBFT from 06/24/2024, which I independently reviewed.  The scan, on my review, showed a small gastric pouch. Contrast empties quickly from the pouch. Her Roux limb is long. No gastro gastric fistula.  Results Labs  - Lab work: 03/2024, Mild anemia  Imaging  - Upper GI: Small pouch and good connections    Assessment/Plan: 1. Class 2 severe obesity due to excess calories with serious comorbidity and body mass index (BMI) of 37.0 to 37.9 in adult (CMD)      2. History of Roux-en-Y gastric bypass        Assessment & Plan 1. Obesity s/p RNY gastric bypass. The upper GI results were satisfactory, indicating a small pouch and well-formed connections. Nearly 100 pounds have been lost since the onset of the weight loss journey. The potential benefits and drawbacks of creating more malabsorption or reversing the bypass to facilitate further weight loss were discussed. Creating more malabsorption could aid in weight loss but may lead to chronic diarrhea, stinky stools, and the need for increased vitamin supplementation. Maintaining a balanced diet, focusing on foods that promote satiety and aid in weight loss, is crucial. Regular exercise, including resistance training, Pilates, and yoga, was recommended to  help build muscle mass and support weight loss. A consultation with Dr. Darilyn will be arranged for further discussion on potential medication options.  2. Constipation. Constipation likely due to bariatric vitamins with iron was reported. MiraLAX  was recommended to regulate bowel movements, with instructions to use the cap as a measure and adjust the dosage as needed to achieve regular bowel movements.  3. Urinary tract infection (UTI). Recurrent UTIs have been experienced since 02/2024 or 03/2024, and antibiotics are being taken daily. Increasing water  intake and incorporating citric acid into the diet by adding lime or lemon to water  were advised to help prevent kidney stones.   Follow up as needed.  Wilfred Winfred Raddle, MD        [1] History reviewed. No pertinent past medical history. [2] Past Surgical History: Procedure Laterality Date  . HYSTERECTOMY      Procedure: HYSTERECTOMY  . TONSILLECTOMY     Procedure: TONSILLECTOMY  . WISDOM TOOTH EXTRACTION     Procedure: WISDOM TOOTH EXTRACTION  [3] Allergies Allergen Reactions  . Codeine Itching  [4] Current Outpatient Medications  Medication Sig Dispense Refill  . buPROPion  (WELLBUTRIN  XL) 150 mg 24 hr tablet Take 150 mg by mouth in the morning.    . estradioL  (ESTRACE ) 0.01 % (0.1 mg/gram) vaginal cream Insert 2 g into the vagina 3 (three) times a week.    . nitrofurantoin , macrocrystal-monohydrate, (MACROBID ) 100 mg capsule Take 100 mg by mouth daily.    . rosuvastatin  (CRESTOR ) 5 mg tablet Take 5 mg by mouth daily.     No current facility-administered medications for this visit.  [5] Social History Socioeconomic History  . Marital status: Married  Tobacco Use  . Smoking status: Never  . Smokeless tobacco: Never  Substance and Sexual Activity  . Alcohol use: No  . Drug use: Never   Social Drivers of Health   Food Insecurity: No Food Insecurity (12/02/2023)   Received from [facility]   Food vital sign   . Within the past 12 months, you worried that your food would run out before you got money to buy more: Never true   . Within the past 12 months, the food you bought just didn't last and you didn't have money to get more: Never true  Transportation  Needs: No Transportation Needs (12/02/2023)   Received from [facility] - Transportation   . Lack of Transportation (Medical): No   . Lack of Transportation (Non-Medical): No  Safety: Not At Risk (12/14/2022)   Received from [facility]   HITS   . Over the last 12 months how often did your partner physically hurt you?: Never   . Over the last 12 months how often did your partner insult you or talk down to you?: Never   . Over the last 12 months how often did your partner threaten you with physical harm?: Never   . Over the last 12 months how often did your partner scream or curse at you?: Never  Living Situation: Low Risk  (12/02/2023)   Received from [facility] Situation   . In the last 12 months, was there a time when you were not able to pay the mortgage or rent on time?: No   . In the past 12 months, how many times have you moved where you were living?: 0   . At any time in the past 12 months, were you homeless or living in a shelter (including now)?: No  [  6] Family History Problem Relation Name Age of Onset  . Hypertension Mother    . Migraines Mother    . Hypertension Father

## 2024-07-14 DIAGNOSIS — Z9884 Bariatric surgery status: Secondary | ICD-10-CM | POA: Diagnosis not present

## 2024-07-14 DIAGNOSIS — Z13220 Encounter for screening for lipoid disorders: Secondary | ICD-10-CM | POA: Diagnosis not present

## 2024-07-14 DIAGNOSIS — F329 Major depressive disorder, single episode, unspecified: Secondary | ICD-10-CM | POA: Diagnosis not present

## 2024-07-15 ENCOUNTER — Other Ambulatory Visit (HOSPITAL_COMMUNITY): Payer: Self-pay

## 2024-07-15 MED ORDER — BUPROPION HCL ER (XL) 150 MG PO TB24
150.0000 mg | ORAL_TABLET | Freq: Every morning | ORAL | 0 refills | Status: AC
  Filled 2024-07-15: qty 90, 90d supply, fill #0

## 2024-07-16 ENCOUNTER — Other Ambulatory Visit (HOSPITAL_BASED_OUTPATIENT_CLINIC_OR_DEPARTMENT_OTHER)

## 2024-07-16 DIAGNOSIS — I77810 Thoracic aortic ectasia: Secondary | ICD-10-CM

## 2024-07-16 LAB — ECHOCARDIOGRAM COMPLETE
AV Vena cont: 0.32 cm
Area-P 1/2: 3.21 cm2
P 1/2 time: 876 ms
S' Lateral: 3.22 cm

## 2024-07-22 ENCOUNTER — Ambulatory Visit (HOSPITAL_BASED_OUTPATIENT_CLINIC_OR_DEPARTMENT_OTHER): Payer: Self-pay | Admitting: Cardiology

## 2024-07-24 ENCOUNTER — Other Ambulatory Visit: Payer: Self-pay | Admitting: Medical Genetics

## 2024-07-24 DIAGNOSIS — R92323 Mammographic fibroglandular density, bilateral breasts: Secondary | ICD-10-CM | POA: Diagnosis not present

## 2024-07-24 DIAGNOSIS — Z780 Asymptomatic menopausal state: Secondary | ICD-10-CM | POA: Diagnosis not present

## 2024-07-24 DIAGNOSIS — Z1231 Encounter for screening mammogram for malignant neoplasm of breast: Secondary | ICD-10-CM | POA: Diagnosis not present

## 2024-07-24 LAB — HM MAMMOGRAPHY

## 2024-07-29 ENCOUNTER — Encounter: Payer: Self-pay | Admitting: Family Medicine

## 2024-07-29 ENCOUNTER — Other Ambulatory Visit (HOSPITAL_BASED_OUTPATIENT_CLINIC_OR_DEPARTMENT_OTHER): Payer: Self-pay | Admitting: Cardiology

## 2024-07-29 ENCOUNTER — Other Ambulatory Visit (HOSPITAL_COMMUNITY): Payer: Self-pay

## 2024-07-29 MED ORDER — ROSUVASTATIN CALCIUM 5 MG PO TABS
5.0000 mg | ORAL_TABLET | Freq: Every day | ORAL | 0 refills | Status: DC
  Filled 2024-07-29: qty 90, 90d supply, fill #0

## 2024-07-30 ENCOUNTER — Other Ambulatory Visit (HOSPITAL_COMMUNITY): Payer: Self-pay

## 2024-07-30 MED ORDER — NITROFURANTOIN MONOHYD MACRO 100 MG PO CAPS
100.0000 mg | ORAL_CAPSULE | Freq: Every day | ORAL | 3 refills | Status: DC
  Filled 2024-07-30: qty 30, 30d supply, fill #0

## 2024-08-27 DIAGNOSIS — M8580 Other specified disorders of bone density and structure, unspecified site: Secondary | ICD-10-CM | POA: Insufficient documentation

## 2024-08-27 DIAGNOSIS — E60 Dietary zinc deficiency: Secondary | ICD-10-CM | POA: Diagnosis not present

## 2024-08-27 DIAGNOSIS — E785 Hyperlipidemia, unspecified: Secondary | ICD-10-CM | POA: Diagnosis not present

## 2024-08-27 DIAGNOSIS — Z9884 Bariatric surgery status: Secondary | ICD-10-CM | POA: Diagnosis not present

## 2024-09-04 DIAGNOSIS — Q6239 Other obstructive defects of renal pelvis and ureter: Secondary | ICD-10-CM | POA: Diagnosis not present

## 2024-09-04 DIAGNOSIS — N3020 Other chronic cystitis without hematuria: Secondary | ICD-10-CM | POA: Diagnosis not present

## 2024-09-08 ENCOUNTER — Other Ambulatory Visit (HOSPITAL_COMMUNITY): Payer: Self-pay

## 2024-09-10 ENCOUNTER — Other Ambulatory Visit: Payer: Self-pay | Admitting: Urology

## 2024-09-10 DIAGNOSIS — N135 Crossing vessel and stricture of ureter without hydronephrosis: Secondary | ICD-10-CM

## 2024-09-11 ENCOUNTER — Other Ambulatory Visit (HOSPITAL_COMMUNITY): Payer: Self-pay | Admitting: Urology

## 2024-09-11 DIAGNOSIS — N135 Crossing vessel and stricture of ureter without hydronephrosis: Secondary | ICD-10-CM

## 2024-10-01 ENCOUNTER — Other Ambulatory Visit (HOSPITAL_COMMUNITY)

## 2024-10-02 ENCOUNTER — Encounter (HOSPITAL_COMMUNITY): Payer: Self-pay

## 2024-10-02 ENCOUNTER — Encounter (HOSPITAL_COMMUNITY)

## 2024-10-04 ENCOUNTER — Telehealth: Admitting: Family

## 2024-10-04 DIAGNOSIS — J069 Acute upper respiratory infection, unspecified: Secondary | ICD-10-CM | POA: Diagnosis not present

## 2024-10-04 MED ORDER — FLUTICASONE PROPIONATE 50 MCG/ACT NA SUSP: 2.0000 | Freq: Every day | NASAL | 6 refills | Status: AC

## 2024-10-04 MED ORDER — PREDNISONE 10 MG (21) PO TBPK: ORAL_TABLET | ORAL | 0 refills | Status: DC

## 2024-10-04 NOTE — Progress Notes (Signed)
 Virtual Visit Consent   Mikayla [name], you are scheduled for a virtual visit with a [facility] provider today. Just as with appointments in the office, your consent must be obtained to participate. Your consent will be active for this visit and any virtual visit you may have with one of our providers in the next 365 days. If you have a MyChart account, a copy of this consent can be sent to you electronically.  As this is a virtual visit, video technology does not allow for your provider to perform a traditional examination. This may limit your provider's ability to fully assess your condition. If your provider identifies any concerns that need to be evaluated in person or the need to arrange testing (such as labs, EKG, etc.), we will make arrangements to do so. Although advances in technology are sophisticated, we cannot ensure that it will always work on either your end or our end. If the connection with a video visit is poor, the visit may have to be switched to a telephone visit. With either a video or telephone visit, we are not always able to ensure that we have a secure connection.  By engaging in this virtual visit, you consent to the provision of healthcare and authorize for your insurance to be billed (if applicable) for the services provided during this visit. Depending on your insurance coverage, you may receive a charge related to this service.  I need to obtain your verbal consent now. Are you willing to proceed with your visit today? Mikayla [name] has provided verbal consent on 10/04/2024 for a virtual visit (video or telephone). Bari Learn, FNP  Date: 10/04/2024 5:38 PM   Virtual Visit via Video Note   I, Bari Learn, connected with  Mikayla [name]  ([id number], October 27, 1966) on 10/04/24 at  5:30 PM EST by a video-enabled telemedicine application and verified that I am speaking with the correct person using two identifiers.  Location: Patient: Virtual Visit Location  Patient: Home Provider: Virtual Visit Location Provider: Home Office   I discussed the limitations of evaluation and management by telemedicine and the availability of in person appointments. The patient expressed understanding and agreed to proceed.    History of Present Illness: Mikayla [name] is a 58 y.o. who identifies as a female who was assigned female at birth, and is being seen today for cough and congestion that started 4 days ago. Had a negative COVID test.   HPI: Sinusitis This is a new problem. The current episode started in the past 7 days. The problem has been gradually worsening since onset. Maximum temperature: 99 F. Her pain is at a severity of 6/10. Associated symptoms include chills, congestion, coughing, headaches, a hoarse voice, sinus pressure and sneezing. Pertinent negatives include no ear pain or sore throat. Past treatments include oral decongestants. The treatment provided mild relief.    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   Hydronephrosis of right kidney 02/26/2024   Recurrent UTI 02/11/2024   Urinary incontinence 07/04/2022   GERD 07/04/2022   Colon polyps 07/04/2022   Back pain 07/04/2022   Anxiety 07/04/2022   Adjustment disorder with anxiety 07/04/2022   Family history of early CAD 05/10/2022   Depression 05/10/2022   Rectal bleeding 02/16/2022   Abnormal weight gain 02/16/2022   Physical exam 12/25/2021   Positive ANA (antinuclear antibody) 10/19/2020   Abnormal lung sounds 10/19/2020   Degenerative  disc disease, lumbar 05/11/2019   Insulin  resistance 02/19/2017   Obesity (BMI 30-39.9) 07/08/2013   Dyshidrotic eczema 09/05/2012   Vitamin D  deficiency 08/13/2012    Allergies:  Allergies  Allergen Reactions   Anesthetics, Amide Other (See Comments)    The caine ex: Lidocaine , Novocain,  just doesn't work per patient 03-24-24    Codeine Itching and Other (See  Comments)   Medications:  Current Outpatient Medications:    fluticasone  (FLONASE ) 50 MCG/ACT nasal spray, Place 2 sprays into both nostrils daily., Disp: 16 g, Rfl: 6   predniSONE  (STERAPRED UNI-PAK 21 TAB) 10 MG (21) TBPK tablet, Use as directed, Disp: 21 tablet, Rfl: 0   buPROPion  (WELLBUTRIN  XL) 150 MG 24 hr tablet, Take 1 tablet (150 mg total) by mouth every morning., Disp: 90 tablet, Rfl: 0   conjugated estrogens  (PREMARIN ) vaginal cream, Apply a pea-sized amount vaginally at bedtime 2-3 times a week., Disp: 30 g, Rfl: 3   estradiol  (ESTRACE ) 0.1 MG/GM vaginal cream, Use 1/2 g vaginally every night for the first 2 weeks, then use 1/2 g vaginally two or three times per week as needed to maintain symptom relief., Disp: 42.5 g, Rfl: 2   gabapentin  (NEURONTIN ) 100 MG capsule, Take 1-3 capsules (100-300 mg total) by mouth 3 (three) times daily as needed., Disp: 30 capsule, Rfl: 3   Multiple Vitamin (MULTIVITAMIN WITH MINERALS) TABS tablet, Take 1 tablet by mouth daily., Disp: , Rfl:    rosuvastatin  (CRESTOR ) 5 MG tablet, Take 1 tablet (5 mg total) by mouth daily., Disp: 90 tablet, Rfl: 0  Observations/Objective: Patient is well-developed, well-nourished in no acute distress.  Resting comfortably  at home.  Head is normocephalic, atraumatic.  No labored breathing.  Speech is clear and coherent with logical content.  Patient is alert and oriented at baseline.  Nasal congestion  Hoarse voice   Assessment and Plan: 1. Viral URI (Primary) - predniSONE  (STERAPRED UNI-PAK 21 TAB) 10 MG (21) TBPK tablet; Use as directed  Dispense: 21 tablet; Refill: 0 - fluticasone  (FLONASE ) 50 MCG/ACT nasal spray; Place 2 sprays into both nostrils daily.  Dispense: 16 g; Refill: 6  - Take meds as prescribed - Use a cool mist humidifier  -Use saline nose sprays frequently -Force fluids -For any cough or congestion  Use plain Mucinex - regular strength or max strength is fine -For fever or aces or pains-  take tylenol  or ibuprofen. -Throat lozenges if help -New toothbrush in 3 days Follow up if symptoms worsen or do not improve  Follow Up Instructions: I discussed the assessment and treatment plan with the patient. The patient was provided an opportunity to ask questions and all were answered. The patient agreed with the plan and demonstrated an understanding of the instructions.  A copy of instructions were sent to the patient via MyChart unless otherwise noted below.     The patient was advised to call back or seek an in-person evaluation if the symptoms worsen or if the condition fails to improve as anticipated.    Bari Learn, FNP

## 2024-10-05 ENCOUNTER — Ambulatory Visit (HOSPITAL_COMMUNITY)

## 2024-10-09 ENCOUNTER — Ambulatory Visit (HOSPITAL_BASED_OUTPATIENT_CLINIC_OR_DEPARTMENT_OTHER): Admitting: Cardiology

## 2024-10-20 DIAGNOSIS — E119 Type 2 diabetes mellitus without complications: Secondary | ICD-10-CM | POA: Diagnosis not present

## 2024-10-20 DIAGNOSIS — H5203 Hypermetropia, bilateral: Secondary | ICD-10-CM | POA: Diagnosis not present

## 2024-10-20 DIAGNOSIS — H25011 Cortical age-related cataract, right eye: Secondary | ICD-10-CM | POA: Diagnosis not present

## 2024-10-20 LAB — OPHTHALMOLOGY REPORT-SCANNED

## 2024-10-21 ENCOUNTER — Ambulatory Visit: Admitting: Family Medicine

## 2024-10-21 VITALS — BP 102/72 | HR 78 | Ht 62.0 in | Wt 200.0 lb

## 2024-10-21 DIAGNOSIS — M25551 Pain in right hip: Secondary | ICD-10-CM | POA: Diagnosis not present

## 2024-10-21 DIAGNOSIS — R0789 Other chest pain: Secondary | ICD-10-CM | POA: Diagnosis not present

## 2024-10-21 MED ORDER — TIZANIDINE HCL 2 MG PO TABS: 2.0000 mg | ORAL_TABLET | Freq: Three times a day (TID) | ORAL | 1 refills | Status: AC | PRN

## 2024-10-21 NOTE — Patient Instructions (Addendum)
 Thank you for coming in today.   Rib Binder  Please use Voltaren  gel (Generic Diclofenac  Gel) up to 4x daily for pain as needed.  This is available over-the-counter as both the name brand Voltaren  gel and the generic diclofenac  gel.   I've referred you to [facility].  Let us  know if you don't hear from them in one week.

## 2024-10-21 NOTE — Progress Notes (Unsigned)
   LILLETTE Ileana Collet, PhD, LAT, ATC acting as a scribe for Artist Lloyd, MD.  Mikayla [name] is a 58 y.o. female who presents to [facility] today for R leg pain. Pt was last seen by Dr. Lloyd on 06/10/24 for LBP and R piriformis syndrome.  Today, pt c/o R leg pain x 3-wks. Pain is similar to previous flare in July. Pt locates pain to R buttock w/ radiating pain along the posterior aspect of her R leg to the bottom of her R foot.  She also c/o L-sided rib pain that started after leaning over to clean the bath tub.   Radiates:Dx imaging: 04/09/21 L-spine MRI             03/31/21 L-spine XR  Pertinent review of systems: No fevers or chills  Relevant historical information: History of piriformis syndrome   Exam:  BP 102/72   Pulse 78   Ht 5' 2 (1.575 m)   Wt 200 lb (90.7 kg)   SpO2 98%   BMI 36.58 kg/m  General: Well Developed, well nourished, and in no acute distress.   MSK: Left rib tender to palpation left anterior inferior lower rib.  Pain with trunk motion.  L-spine normal.  Nontender palpation midline.  Normal lumbar motion.  Lower extremity strength is intact.    Lab and Radiology Results No results found for this or any previous visit (from the past 72 hours). No results found.     Assessment and Plan: 58 y.o. female with rib pain and leg pain.  Rib pain due to rib contusion.  Plan for rib binder and physical therapy.  Hip and leg pain due to either piriformis syndrome or lumbar radiculopathy.  I did prescribe some tizanidine which she can use especially at bedtime.  Additionally physical therapy ordered.   PDMP not reviewed this encounter. Orders Placed This Encounter  Procedures   Ambulatory referral to Physical Therapy    Referral Priority:   Routine    Referral Type:   Physical Medicine    Referral Reason:   Specialty Services Required    Requested Specialty:   Physical Therapy    Number of Visits Requested:   1   Meds  ordered this encounter  Medications   tiZANidine (ZANAFLEX) 2 MG tablet    Sig: Take 1-2 tablets (2-4 mg total) by mouth every 8 (eight) hours as needed.    Dispense:  60 tablet    Refill:  1     Discussed warning signs or symptoms. Please see discharge instructions. Patient expresses understanding.   The above documentation has been reviewed and is accurate and complete Artist Lloyd, M.D.

## 2024-10-23 DIAGNOSIS — R635 Abnormal weight gain: Secondary | ICD-10-CM | POA: Diagnosis not present

## 2024-10-27 ENCOUNTER — Ambulatory Visit: Admitting: Family Medicine

## 2024-10-29 DIAGNOSIS — E611 Iron deficiency: Secondary | ICD-10-CM | POA: Diagnosis not present

## 2024-10-29 DIAGNOSIS — E538 Deficiency of other specified B group vitamins: Secondary | ICD-10-CM | POA: Diagnosis not present

## 2024-10-29 DIAGNOSIS — E785 Hyperlipidemia, unspecified: Secondary | ICD-10-CM | POA: Diagnosis not present

## 2024-10-29 DIAGNOSIS — E60 Dietary zinc deficiency: Secondary | ICD-10-CM | POA: Diagnosis not present

## 2024-11-09 ENCOUNTER — Other Ambulatory Visit (HOSPITAL_COMMUNITY): Payer: Self-pay
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
## 2024-12-03 ENCOUNTER — Ambulatory Visit (HOSPITAL_BASED_OUTPATIENT_CLINIC_OR_DEPARTMENT_OTHER): Attending: Family Medicine | Admitting: Physical Therapy

## 2024-12-03 ENCOUNTER — Encounter (HOSPITAL_BASED_OUTPATIENT_CLINIC_OR_DEPARTMENT_OTHER): Payer: Self-pay | Admitting: Physical Therapy

## 2024-12-03 ENCOUNTER — Other Ambulatory Visit: Payer: Self-pay

## 2024-12-03 DIAGNOSIS — M25551 Pain in right hip: Secondary | ICD-10-CM | POA: Insufficient documentation

## 2024-12-03 DIAGNOSIS — R262 Difficulty in walking, not elsewhere classified: Secondary | ICD-10-CM | POA: Insufficient documentation

## 2024-12-03 DIAGNOSIS — M6281 Muscle weakness (generalized): Secondary | ICD-10-CM | POA: Insufficient documentation

## 2024-12-03 DIAGNOSIS — R0789 Other chest pain: Secondary | ICD-10-CM | POA: Insufficient documentation

## 2024-12-03 DIAGNOSIS — M5459 Other low back pain: Secondary | ICD-10-CM | POA: Insufficient documentation

## 2024-12-05 ENCOUNTER — Encounter (HOSPITAL_BASED_OUTPATIENT_CLINIC_OR_DEPARTMENT_OTHER): Payer: Self-pay

## 2024-12-05 ENCOUNTER — Other Ambulatory Visit: Payer: Self-pay

## 2024-12-05 ENCOUNTER — Emergency Department (HOSPITAL_BASED_OUTPATIENT_CLINIC_OR_DEPARTMENT_OTHER)
Admission: EM | Admit: 2024-12-05 | Discharge: 2024-12-05 | Disposition: A | Discharge status: Home / Self Care | Attending: Emergency Medicine | Admitting: Emergency Medicine

## 2024-12-05 ENCOUNTER — Telehealth: Admitting: Family Medicine

## 2024-12-05 DIAGNOSIS — I959 Hypotension, unspecified: Secondary | ICD-10-CM

## 2024-12-05 DIAGNOSIS — I9589 Other hypotension: Secondary | ICD-10-CM | POA: Insufficient documentation

## 2024-12-05 DIAGNOSIS — R55 Syncope and collapse: Secondary | ICD-10-CM | POA: Insufficient documentation

## 2024-12-05 DIAGNOSIS — D72829 Elevated white blood cell count, unspecified: Secondary | ICD-10-CM | POA: Diagnosis not present

## 2024-12-05 DIAGNOSIS — R42 Dizziness and giddiness: Secondary | ICD-10-CM | POA: Diagnosis present

## 2024-12-05 LAB — CBC
HCT: 37.3 % (ref 36.0–46.0)
Hemoglobin: 12.2 g/dL (ref 12.0–15.0)
MCH: 30.3 pg (ref 26.0–34.0)
MCHC: 32.7 g/dL (ref 30.0–36.0)
MCV: 92.6 fL (ref 80.0–100.0)
Platelets: 166 K/uL (ref 150–400)
RBC: 4.03 MIL/uL (ref 3.87–5.11)
RDW: 12.4 % (ref 11.5–15.5)
WBC: 11.9 K/uL — ABNORMAL HIGH (ref 4.0–10.5)
nRBC: 0 % (ref 0.0–0.2)

## 2024-12-05 LAB — URINALYSIS, ROUTINE W REFLEX MICROSCOPIC
Bilirubin Urine: NEGATIVE
Glucose, UA: NEGATIVE mg/dL
Hgb urine dipstick: NEGATIVE
Ketones, ur: NEGATIVE mg/dL
Leukocytes,Ua: NEGATIVE
Nitrite: NEGATIVE
Protein, ur: NEGATIVE mg/dL
Specific Gravity, Urine: 1.009 (ref 1.005–1.030)
pH: 6 (ref 5.0–8.0)

## 2024-12-05 LAB — BASIC METABOLIC PANEL WITH GFR
Anion gap: 11 (ref 5–15)
BUN: 18 mg/dL (ref 6–20)
CO2: 24 mmol/L (ref 22–32)
Calcium: 9.1 mg/dL (ref 8.9–10.3)
Chloride: 106 mmol/L (ref 98–111)
Creatinine, Ser: 0.57 mg/dL (ref 0.44–1.00)
GFR, Estimated: >60 mL/min
Glucose, Bld: 93 mg/dL (ref 70–99)
Potassium: 4 mmol/L (ref 3.5–5.1)
Sodium: 140 mmol/L (ref 135–145)

## 2024-12-05 LAB — TROPONIN T, HIGH SENSITIVITY: Troponin T High Sensitivity: <15 ng/L (ref 0–19)

## 2024-12-05 LAB — MAGNESIUM: Magnesium: 2 mg/dL (ref 1.7–2.4)

## 2024-12-05 NOTE — Patient Instructions (Signed)
" °  Mikayla [name], thank you for joining Roosvelt Mater, PA-C for today's virtual visit.  While this provider is not your primary care provider (PCP), if your PCP is located in our provider database this encounter information will be shared with them immediately following your visit.   A [employer] MyChart account gives you access to today's visit and all your visits, tests, and labs performed at [facility]  click here if you don't have a [facility] MyChart account or go to [email or web address]  Consent: (Patient) Mikayla [name] provided verbal consent for this virtual visit at the beginning of the encounter.  Current Medications:  Current Outpatient Medications:    buPROPion  (WELLBUTRIN  XL) 150 MG 24 hr tablet, Take 1 tablet (150 mg total) by mouth every morning., Disp: 90 tablet, Rfl: 0   conjugated estrogens  (PREMARIN ) vaginal cream, Apply a pea-sized amount vaginally at bedtime 2-3 times a week., Disp: 30 g, Rfl: 3   fluticasone  (FLONASE ) 50 MCG/ACT nasal spray, Place 2 sprays into both nostrils daily., Disp: 16 g, Rfl: 6   Multiple Vitamin (MULTIVITAMIN WITH MINERALS) TABS tablet, Take 1 tablet by mouth daily., Disp: , Rfl:    rosuvastatin  (CRESTOR ) 5 MG tablet, Take 1 tablet (5 mg total) by mouth daily., Disp: 90 tablet, Rfl: 0   tiZANidine  (ZANAFLEX ) 2 MG tablet, Take 1-2 tablets (2-4 mg total) by mouth every 8 (eight) hours as needed., Disp: 60 tablet, Rfl: 1   Medications ordered in this encounter:  No orders of the defined types were placed in this encounter.    *If you need refills on other medications prior to your next appointment, please contact your pharmacy*  Follow-Up: Call back or seek an in-person evaluation if the symptoms worsen or if the condition fails to improve as anticipated.  [facility] [phone]  Other Instructions Please proceed to nearest emergency room hospital.    If you have been instructed to have an  in-person evaluation today at a local Urgent Care facility, please use the link below. It will take you to a list of all of our available [facility], including address, phone number and hours of operation. Please do not delay care.  [facility]  If you or a family member do not have a primary care provider, use the link below to schedule a visit and establish care. When you choose a [facility] primary care physician or advanced practice provider, you gain a long-term partner in health. Find a Primary Care Provider  Learn more about [facility]'s in-office and virtual care options: [facility] - Get Care Now  "

## 2024-12-05 NOTE — Discharge Instructions (Signed)
 Please read and follow all provided instructions.  Your diagnoses today include:  1. Near syncope   2. Transient hypotension    Tests performed today include: Complete blood cell count: White blood cell count was slightly high but normal red blood cells Basic metabolic panel: Normal electrolytes and kidney function including magnesium Cardiac enzymes (blood test looking for stress on the heart): Was normal EKG: Was unchanged from previous, no signs of abnormal heart rhythm or other problems Urinalysis (urine test): Was negative for infection, urine culture sent given symptoms Vital signs. See below for your results today.   Medications prescribed:  None  Take any prescribed medications only as directed.  Home care instructions:  Follow any educational materials contained in this packet.  BE VERY CAREFUL not to take multiple medicines containing Tylenol  (also called acetaminophen ). Doing so can lead to an overdose which can damage your liver and cause liver failure and possibly death.   Follow-up instructions: Please follow-up with your primary care provider in the next 3 days for further evaluation of your symptoms.   Return instructions:  Please return to the Emergency Department if you experience worsening symptoms.  Return to the emergency department if you develop chest pain, shortness of breath, severe headache, pass out.  Please return if you have any other emergent concerns.  Additional Information:  Your vital signs today were: BP 110/69 (BP Location: Right Arm)   Pulse 85   Temp 98 F (36.7 C)   Resp 15   Ht 5' 2 (1.575 m)   Wt 84.4 kg   SpO2 95%   BMI 34.02 kg/m  If your blood pressure (BP) was elevated above 135/85 this visit, please have this repeated by your doctor within one month. --------------

## 2024-12-05 NOTE — Progress Notes (Signed)
 " Virtual Visit Consent   Mikayla [name], you are scheduled for a virtual visit with a [facility] provider today. Just as with appointments in the office, your consent must be obtained to participate. Your consent will be active for this visit and any virtual visit you may have with one of our providers in the next 365 days. If you have a MyChart account, a copy of this consent can be sent to you electronically.  As this is a virtual visit, video technology does not allow for your provider to perform a traditional examination. This may limit your provider's ability to fully assess your condition. If your provider identifies any concerns that need to be evaluated in person or the need to arrange testing (such as labs, EKG, etc.), we will make arrangements to do so. Although advances in technology are sophisticated, we cannot ensure that it will always work on either your end or our end. If the connection with a video visit is poor, the visit may have to be switched to a telephone visit. With either a video or telephone visit, we are not always able to ensure that we have a secure connection.  By engaging in this virtual visit, you consent to the provision of healthcare and authorize for your insurance to be billed (if applicable) for the services provided during this visit. Depending on your insurance coverage, you may receive a charge related to this service.  I need to obtain your verbal consent now. Are you willing to proceed with your visit today? Mikayla [name] has provided verbal consent on 12/05/2024 for a virtual visit (video or telephone). Mikayla [name], [place]  Date: 12/05/2024 3:06 PM   Virtual Visit via Video Note   I, Mikayla [name], connected with  Mikayla [name]  ([id number], July 27, 1966) on 12/05/24 at  3:00 PM EST by a video-enabled telemedicine application and verified that I am speaking with the correct person using two identifiers.  Location: Patient: Virtual Visit Location  Patient: Home Provider: Virtual Visit Location Provider: Home Office   I discussed the limitations of evaluation and management by telemedicine and the availability of in person appointments. The patient expressed understanding and agreed to proceed.    History of Present Illness: Mikayla [name] is a 59 y.o. who identifies as a female who was assigned female at birth, and is being seen today for c/o having a low blood pressure episode and feeling dizzy.  Pt states she had a few episode of low blood pressure. Pt states her blood pressure 94/62.  Pt states she just has dizziness but states that while she was showering everything turned black and barely had enough energy to get out of the bathroom.   HPI: HPI  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   Hydronephrosis of right kidney 02/26/2024   Recurrent UTI 02/11/2024   Urinary incontinence 07/04/2022   GERD 07/04/2022   Colon polyps 07/04/2022   Back pain 07/04/2022   Anxiety 07/04/2022   Adjustment disorder with anxiety 07/04/2022   Family history of early CAD 05/10/2022   Depression 05/10/2022   Rectal bleeding 02/16/2022   Abnormal weight gain 02/16/2022   Physical exam 12/25/2021   Positive ANA (antinuclear antibody) 10/19/2020   Abnormal lung sounds 10/19/2020   Degenerative disc disease, lumbar 05/11/2019   Insulin  resistance 02/19/2017   Obesity (BMI 30-39.9) 07/08/2013   Dyshidrotic eczema 09/05/2012   Vitamin D  deficiency  08/13/2012    Allergies: Allergies[1] Medications: Current Medications[2]  Observations/Objective: Patient is well-developed, well-nourished in no acute distress.  Resting comfortably  at home.  Head is normocephalic, atraumatic.  No labored breathing.  Speech is clear and coherent with logical content.  Patient is alert and oriented at baseline.    Assessment and Plan: 1. Hypotension, unspecified hypotension type  (Primary)  -Pt advised to proceed to the hospital for further evaluation. -Pt advised to call 911 and Pt stated she has someone to drive her to the hospital   Follow Up Instructions: I discussed the assessment and treatment plan with the patient. The patient was provided an opportunity to ask questions and all were answered. The patient agreed with the plan and demonstrated an understanding of the instructions.  A copy of instructions were sent to the patient via MyChart unless otherwise noted below.     The patient was advised to call back or seek an in-person evaluation if the symptoms worsen or if the condition fails to improve as anticipated.    Oliver Heitzenrater, PA-C    [1]  Allergies Allergen Reactions   Anesthetics, Amide Other (See Comments)    The caine ex: Lidocaine , Novocain,  just doesn't work per patient 03-24-24    Codeine Itching and Other (See Comments)  [2]  Current Outpatient Medications:    buPROPion  (WELLBUTRIN  XL) 150 MG 24 hr tablet, Take 1 tablet (150 mg total) by mouth every morning., Disp: 90 tablet, Rfl: 0   conjugated estrogens  (PREMARIN ) vaginal cream, Apply a pea-sized amount vaginally at bedtime 2-3 times a week., Disp: 30 g, Rfl: 3   fluticasone  (FLONASE ) 50 MCG/ACT nasal spray, Place 2 sprays into both nostrils daily., Disp: 16 g, Rfl: 6   Multiple Vitamin (MULTIVITAMIN WITH MINERALS) TABS tablet, Take 1 tablet by mouth daily., Disp: , Rfl:    rosuvastatin  (CRESTOR ) 5 MG tablet, Take 1 tablet (5 mg total) by mouth daily., Disp: 90 tablet, Rfl: 0   tiZANidine  (ZANAFLEX ) 2 MG tablet, Take 1-2 tablets (2-4 mg total) by mouth every 8 (eight) hours as needed., Disp: 60 tablet, Rfl: 1  "

## 2024-12-05 NOTE — ED Provider Notes (Signed)
 " [name] Provider Note   CSN: [id number] Arrival date & time: 12/05/24  1556     Patient presents with: Dizziness   Mikayla [name] is a 59 y.o. female.   Patient with history of high cholesterol presents to the emergency department today for evaluation of near syncope.  Patient states that last night she began to have some cramps and discomfort in her legs.  She got up in the middle the night and took some Tylenol .  She then tried to take a shower.  While in the shower she began to feel very lightheaded and had tunnel vision.  She did not have full syncope.  She was able to sit in a shower chair and allow the symptoms to pass.  She did check her blood pressure which was low, 90s over 50s.  No history of hypertension or use of antihypertensives.  She lay down for a bit.  She tends to feel worse when she stands up.  She had a video visit today and they recommended that she come to the emergency department.  Patient denies associated chest pain or shortness of breath.  She denies cardiac history.  No history of heart failure or arrhythmia.  She is eating and drinking well without any nausea, vomiting, diarrhea.  Denies flulike symptoms.  She reported mild dysuria, for which she took AZO.  No lower extremity edema or history of DVT.       Prior to Admission medications  Medication Sig Start Date End Date Taking? Authorizing Provider  buPROPion  (WELLBUTRIN  XL) 150 MG 24 hr tablet Take 1 tablet (150 mg total) by mouth every morning. 07/14/24     conjugated estrogens  (PREMARIN ) vaginal cream Apply a pea-sized amount vaginally at bedtime 2-3 times a week. 02/28/24     fluticasone  (FLONASE ) 50 MCG/ACT nasal spray Place 2 sprays into both nostrils daily. 10/04/24   Lavell Bari LABOR, FNP  Multiple Vitamin (MULTIVITAMIN WITH MINERALS) TABS tablet Take 1 tablet by mouth daily.    [provider]  rosuvastatin  (CRESTOR ) 5 MG tablet Take 1 tablet (5  mg total) by mouth daily. 07/29/24   Lonni Slain, MD  tiZANidine  (ZANAFLEX ) 2 MG tablet Take 1-2 tablets (2-4 mg total) by mouth every 8 (eight) hours as needed. 10/21/24   Corey, Evan S, MD    Allergies: Anesthetics, amide and Codeine    Review of Systems  Updated Vital Signs BP 110/69 (BP Location: Right Arm)   Pulse 85   Temp 98 F (36.7 C)   Resp 15   Ht 5' 2 (1.575 m)   Wt 84.4 kg   SpO2 95%   BMI 34.02 kg/m   Physical Exam Vitals and nursing note reviewed.  Constitutional:      Appearance: She is well-developed. She is not diaphoretic.  HENT:     Head: Normocephalic and atraumatic.     Mouth/Throat:     Mouth: Mucous membranes are not dry.  Eyes:     Conjunctiva/sclera: Conjunctivae normal.  Neck:     Vascular: Normal carotid pulses. No JVD.     Trachea: Trachea normal. No tracheal deviation.  Cardiovascular:     Rate and Rhythm: Normal rate and regular rhythm.     Pulses: No decreased pulses.          Radial pulses are 2+ on the right side and 2+ on the left side.     Heart sounds: Normal heart sounds, S1 normal and S2  normal. No murmur heard.    Comments: No murmur Pulmonary:     Effort: Pulmonary effort is normal. No respiratory distress.     Breath sounds: No wheezing.  Chest:     Chest wall: No tenderness.  Abdominal:     General: Bowel sounds are normal.     Palpations: Abdomen is soft.     Tenderness: There is no abdominal tenderness. There is no guarding or rebound.  Musculoskeletal:        General: Normal range of motion.     Cervical back: Normal range of motion and neck supple. No muscular tenderness.     Right lower leg: No edema.     Left lower leg: No edema.     Comments: No clinical signs or symptoms of DVT.  Skin:    General: Skin is warm and dry.     Coloration: Skin is not pale.  Neurological:     Mental Status: She is alert.     (all labs ordered are listed, but only abnormal results are displayed) Labs Reviewed  CBC -  Abnormal; Notable for the following components:      Result Value   WBC 11.9 (*)    All other components within normal limits  URINE CULTURE  BASIC METABOLIC PANEL WITH GFR  URINALYSIS, ROUTINE W REFLEX MICROSCOPIC  MAGNESIUM  TROPONIN T, HIGH SENSITIVITY    ED ECG REPORT   Date: 12/05/2024  Rate: 86  Rhythm: normal sinus rhythm  QRS Axis: normal  Intervals: normal  ST/T Wave abnormalities: nonspecific T wave changes  Conduction Disutrbances:none  Narrative Interpretation:   Old EKG Reviewed: unchanged from 03/2024  I have personally reviewed the EKG tracing and agree with the computerized printout as noted.   Radiology: No results found.   Procedures   Medications Ordered in the ED - No data to display  ED Course  Patient seen and examined. History obtained directly from patient. Work-up including labs, imaging, EKG ordered in triage, if performed, were reviewed.    Labs/EKG: Independently reviewed and interpreted.  This included: CBC with elevated white blood cell count 11.9 but normal hemoglobin; BMP unremarkable including normal electrolytes and kidney function; troponin less than 15.  EKG personally reviewed and interpreted, unchanged from previous.  Added magnesium and UA.  She will only need 1 troponin.  Imaging: None ordered  Medications/Fluids: None ordered.  Most recent vital signs reviewed and are as follows: BP 110/69 (BP Location: Right Arm)   Pulse 85   Temp 98 F (36.7 C)   Resp 15   Ht 5' 2 (1.575 m)   Wt 84.4 kg   SpO2 95%   BMI 34.02 kg/m   Initial impression: Near syncopal spell, unclear etiology.  No significant cardiac history or risk factors.  Normotensive here.  Will check orthostatics.  Given muscle cramps will add on magnesium as well.  EKG stable and unchanged from previous.  7:11 PM Reassessment performed. Patient appears stable, comfortable.  No recurrent symptoms.  Labs personally reviewed and interpreted including: Magnesium  was normal.  UA was negative.  Patient states that she has had UTI in the past that has only showed up on a culture.  For this reason and dysuria, culture sent.  Reviewed pertinent lab work and imaging with patient at bedside. Questions answered.   Most current vital signs reviewed and are as follows: BP 110/69 (BP Location: Right Arm)   Pulse 85   Temp 98 F (36.7 C)  Resp 15   Ht 5' 2 (1.575 m)   Wt 84.4 kg   SpO2 95%   BMI 34.02 kg/m   Orthostatic VS for the past 24 hrs:  BP- Lying Pulse- Lying BP- Sitting Pulse- Sitting BP- Standing at 0 minutes Pulse- Standing at 0 minutes  12/05/24 1737 109/72 83 106/65 72 108/75 81    Plan: Discharge to home.   Prescriptions written for: None  Other home care instructions discussed: Rest, maintain good hydration  ED return instructions discussed: Return with chest pain, shortness of breath, headache, syncopal episode  Follow-up instructions discussed: Patient encouraged to follow-up with their PCP in 3 days.                                   Medical Decision Making Amount and/or Complexity of Data Reviewed Labs: ordered.   Patient presents to the emergency department today for near syncope.  This started with some cramping in her legs last night.  No associated chest pain or shortness of breath.  She did not have full syncope.  No severe headache or neurosymptoms.  Blood pressure was transiently low.  Here in the emergency department, blood pressure is near normal.  Lab work appears very reassuring.  Patient does report history of UTI and some mild irritation.  UA was normal --culture sent and can be followed up as outpatient.  Normal orthostatics.  Do not suspect severe dehydration.  She is not on any antihypertensives.  She looks well, nontoxic.  EKG with nonspecific T wave change, unchanged from previous.  No evidence of Brugada syndrome, WPW, prolonged QT, heart block or other sign of ischemia.  Troponin was negative.  Low clinical  concern for ACS, PE.  No headache to suggest subarachnoid hemorrhage.  Patient appears stable for discharge and should be able to follow-up with primary care.  The patient's vital signs, pertinent lab work and imaging were reviewed and interpreted as discussed in the ED course. Hospitalization was considered for further testing, treatments, or serial exams/observation. However as patient is well-appearing, has a stable exam, and reassuring studies today, I do not feel that they warrant admission at this time. This plan was discussed with the patient who verbalizes agreement and comfort with this plan and seems reliable and able to return to the Emergency Department with worsening or changing symptoms.       Final diagnoses:  Near syncope  Transient hypotension    ED Discharge Orders     None          Desiderio Chew, PA-C 12/05/24 1913    Ellouise Richerd POUR, [place] 12/05/24 2318  "

## 2024-12-05 NOTE — ED Triage Notes (Signed)
 Pt reports waking up this AM and c/o dizziness and hypotension. Pt reports BP was 80/60.

## 2024-12-08 ENCOUNTER — Encounter: Payer: Self-pay | Admitting: Family Medicine

## 2024-12-08 ENCOUNTER — Ambulatory Visit: Admitting: Family Medicine

## 2024-12-08 VITALS — BP 90/60 | HR 78 | Temp 97.8°F | Resp 18 | Ht 62.0 in | Wt 190.8 lb

## 2024-12-08 DIAGNOSIS — E669 Obesity, unspecified: Secondary | ICD-10-CM | POA: Diagnosis not present

## 2024-12-08 DIAGNOSIS — E559 Vitamin D deficiency, unspecified: Secondary | ICD-10-CM | POA: Diagnosis not present

## 2024-12-08 DIAGNOSIS — Z0000 Encounter for general adult medical examination without abnormal findings: Secondary | ICD-10-CM | POA: Diagnosis not present

## 2024-12-08 DIAGNOSIS — Z1211 Encounter for screening for malignant neoplasm of colon: Secondary | ICD-10-CM

## 2024-12-08 LAB — CBC WITH DIFFERENTIAL/PLATELET
Basophils Absolute: 0 K/uL (ref 0.0–0.1)
Basophils Relative: 0.5 % (ref 0.0–3.0)
Eosinophils Absolute: 0.1 K/uL (ref 0.0–0.7)
Eosinophils Relative: 2.8 % (ref 0.0–5.0)
HCT: 38.6 % (ref 36.0–46.0)
Hemoglobin: 12.6 g/dL (ref 12.0–15.0)
Lymphocytes Relative: 24 % (ref 12.0–46.0)
Lymphs Abs: 1 K/uL (ref 0.7–4.0)
MCHC: 32.7 g/dL (ref 30.0–36.0)
MCV: 92.5 fl (ref 78.0–100.0)
Monocytes Absolute: 0.4 K/uL (ref 0.1–1.0)
Monocytes Relative: 9.4 % (ref 3.0–12.0)
Neutro Abs: 2.7 K/uL (ref 1.4–7.7)
Neutrophils Relative %: 63.3 % (ref 43.0–77.0)
Platelets: 156 K/uL (ref 150.0–400.0)
RBC: 4.17 Mil/uL (ref 3.87–5.11)
RDW: 13.4 % (ref 11.5–15.5)
WBC: 4.3 K/uL (ref 4.0–10.5)

## 2024-12-08 LAB — URINE CULTURE: Culture: 40000 — AB

## 2024-12-08 LAB — VITAMIN D 25 HYDROXY (VIT D DEFICIENCY, FRACTURES): VITD: 28.01 ng/mL — ABNORMAL LOW (ref 30.00–100.00)

## 2024-12-08 LAB — BASIC METABOLIC PANEL WITH GFR
BUN: 16 mg/dL (ref 6–23)
CO2: 32 meq/L (ref 19–32)
Calcium: 9.2 mg/dL (ref 8.4–10.5)
Chloride: 105 meq/L (ref 96–112)
Creatinine, Ser: 0.51 mg/dL (ref 0.40–1.20)
GFR: 101.39 mL/min
Glucose, Bld: 81 mg/dL (ref 70–99)
Potassium: 3.9 meq/L (ref 3.5–5.1)
Sodium: 142 meq/L (ref 135–145)

## 2024-12-08 LAB — HEPATIC FUNCTION PANEL
ALT: 13 U/L (ref 3–35)
AST: 14 U/L (ref 5–37)
Albumin: 3.9 g/dL (ref 3.5–5.2)
Alkaline Phosphatase: 76 U/L (ref 39–117)
Bilirubin, Direct: 0.1 mg/dL (ref 0.1–0.3)
Total Bilirubin: 0.4 mg/dL (ref 0.2–1.2)
Total Protein: 6.3 g/dL (ref 6.0–8.3)

## 2024-12-08 LAB — LIPID PANEL
Cholesterol: 157 mg/dL (ref 28–200)
HDL: 47.6 mg/dL
LDL Cholesterol: 92 mg/dL (ref 10–99)
NonHDL: 109.21
Total CHOL/HDL Ratio: 3
Triglycerides: 88 mg/dL (ref 10.0–149.0)
VLDL: 17.6 mg/dL (ref 0.0–40.0)

## 2024-12-08 LAB — TSH: TSH: 3 u[IU]/mL (ref 0.35–5.50)

## 2024-12-08 NOTE — Progress Notes (Signed)
" ° °  Subjective:    Patient ID: Mikayla [name], female    DOB: March 07, 1966, 59 y.o.   MRN: [id number]  HPI CPE- due for repeat colonoscopy, declines flu and PNA.  UTD on mammo.  Health Maintenance  Topic Date Due   Colonoscopy  11/03/2024   Influenza Vaccine  02/16/2025 (Originally 06/19/2024)   Pneumococcal Vaccine: 50+ Years (1 of 2 - PCV) 12/08/2025 (Originally 06/03/1983)   Mammogram  07/24/2025   HPV VACCINES (No Doses Required) Completed   Hepatitis C Screening  Completed   HIV Screening  Completed   Hepatitis B Vaccines 19-59 Average Risk  Aged Out   Meningococcal B Vaccine  Aged Out   DTaP/Tdap/Td  Discontinued   COVID-19 Vaccine  Discontinued   Zoster Vaccines- Shingrix  Discontinued    Patient Care Team    Relationship Specialty Notifications Start End  Mahlon Comer BRAVO, MD PCP - General Family Medicine  03/03/21   Lonni Slain, MD PCP - Cardiology Cardiology  07/10/22   Joshua Blamer, MD Consulting Physician Dermatology  11/02/16   Kristie Lamprey, MD Consulting Physician Gastroenterology  11/02/16   Specialists, [facility] Consulting Physician Orthopedic Surgery  12/15/18    Comment: Dr. Lynwood Bame  Cathlyn JAYSON Nikki Bobie BRAVO, MD Consulting Physician Obstetrics and Gynecology  03/21/23       Review of Systems Patient reports no vision/ hearing changes, adenopathy,fever, weight change,  persistant/recurrent hoarseness , swallowing issues, chest pain, palpitations, edema, persistant/recurrent cough, hemoptysis, dyspnea (rest/exertional/paroxysmal nocturnal), gastrointestinal bleeding (melena, rectal bleeding), abdominal pain, significant heartburn, bowel changes, GU symptoms (dysuria, hematuria, incontinence), Gyn symptoms (abnormal  bleeding, pain),  syncope, focal weakness, memory loss, numbness & tingling, skin/hair/nail changes, abnormal bruising or bleeding, anxiety, or depression.     Objective:   Physical Exam General Appearance:    Alert,  cooperative, no distress, appears stated age, obese  Head:    Normocephalic, without obvious abnormality, atraumatic  Eyes:    PERRL, conjunctiva/corneas clear, EOM's intact both eyes  Ears:    Normal TM's and external ear canals, both ears  Nose:   Nares normal, septum midline, mucosa normal, no drainage    or sinus tenderness  Throat:   Lips, mucosa, and tongue normal; teeth and gums normal  Neck:   Supple, symmetrical, trachea midline, no adenopathy;    Thyroid : no enlargement/tenderness/nodules  Back:     Symmetric, no curvature, ROM normal, no CVA tenderness  Lungs:     Clear to auscultation bilaterally, respirations unlabored  Chest Wall:    No tenderness or deformity   Heart:    Regular rate and rhythm, S1 and S2 normal, no murmur, rub   or gallop  Breast Exam:    Deferred to GYN  Abdomen:     Soft, non-tender, bowel sounds active all four quadrants,    no masses, no organomegaly  Genitalia:    Deferred to GYN  Rectal:    Extremities:   Extremities normal, atraumatic, no cyanosis or edema  Pulses:   2+ and symmetric all extremities  Skin:   Skin color, texture, turgor normal, no rashes or lesions  Lymph nodes:   Cervical, supraclavicular, and axillary nodes normal  Neurologic:   CNII-XII intact, normal strength, sensation and reflexes    throughout          Assessment & Plan:    "

## 2024-12-08 NOTE — Patient Instructions (Signed)
 Follow up in 6 months to recheck cholesterol We'll notify you of your lab results and make any changes if needed Continue to work on healthy diet and regular exercise- you're doing great! Make sure you are drinking LOTS of fluids and increase your salt intake to keep your blood pressure up We'll call you to schedule your GI consultation for the colonoscopy Call with any questions or concerns Stay Safe!  Stay Healthy! Happy New Year!

## 2024-12-08 NOTE — Assessment & Plan Note (Signed)
 Pt's PE WNL w/ exception of obesity.  Due for repeat colonoscopy and would like to switch to [facility].  UTD on mammo.  Declined flu and PNA.  Check labs.  Anticipatory guidance provided.

## 2024-12-09 ENCOUNTER — Telehealth (HOSPITAL_BASED_OUTPATIENT_CLINIC_OR_DEPARTMENT_OTHER): Payer: Self-pay

## 2024-12-09 ENCOUNTER — Ambulatory Visit: Payer: Self-pay | Admitting: Family Medicine

## 2024-12-09 ENCOUNTER — Encounter: Payer: Self-pay | Admitting: Family Medicine

## 2024-12-09 MED ORDER — CEPHALEXIN 500 MG PO CAPS: 500.0000 mg | ORAL_CAPSULE | Freq: Two times a day (BID) | ORAL | 0 refills | Status: AC

## 2024-12-09 MED ORDER — VITAMIN D (ERGOCALCIFEROL) 1.25 MG (50000 UNIT) PO CAPS: 50000.0000 [IU] | ORAL_CAPSULE | ORAL | 0 refills | Status: AC

## 2024-12-09 NOTE — Telephone Encounter (Signed)
 Please advise.

## 2024-12-09 NOTE — Telephone Encounter (Signed)
 Post ED Visit - Positive Culture Follow-up: Successful Patient Follow-Up  Culture assessed and recommendations reviewed by:  [x]  Elma Fail, Pharm.D. []  Venetia Gully, Pharm.D., BCPS AQ-ID []  Garrel Crews, Pharm.D., BCPS []  Almarie Lunger, Pharm.D., BCPS []  [place], [place].D., BCPS, AAHIVP []  Rosaline Bihari, Pharm.D., BCPS, AAHIVP []  Vernell Meier, PharmD, BCPS []  Latanya Hint, PharmD, BCPS []  Donald Medley, PharmD, BCPS []  Rocky Bold, PharmD  Positive urine culture  []  Patient discharged without antimicrobial prescription and treatment is now indicated [x]  Organism is resistant to prescribed ED discharge antimicrobial []  Patient with positive blood cultures  Changes discussed with ED provider: Rocky Hamilton, PA-C New antibiotic prescription Cephalexin  500 mg po BID x 3 days Called to [facility] [address]  Contacted patient, date 12/09/24, time 10:20 am   Mikayla [name] 12/09/2024, 10:24 AM

## 2024-12-11 ENCOUNTER — Ambulatory Visit (HOSPITAL_BASED_OUTPATIENT_CLINIC_OR_DEPARTMENT_OTHER): Admitting: Physical Therapy

## 2024-12-15 ENCOUNTER — Ambulatory Visit (HOSPITAL_BASED_OUTPATIENT_CLINIC_OR_DEPARTMENT_OTHER): Admitting: Physical Therapy

## 2024-12-23 ENCOUNTER — Encounter (HOSPITAL_BASED_OUTPATIENT_CLINIC_OR_DEPARTMENT_OTHER): Payer: Self-pay

## 2024-12-23 ENCOUNTER — Encounter (HOSPITAL_BASED_OUTPATIENT_CLINIC_OR_DEPARTMENT_OTHER): Payer: Self-pay | Admitting: Physical Therapy

## 2024-12-23 ENCOUNTER — Ambulatory Visit (HOSPITAL_BASED_OUTPATIENT_CLINIC_OR_DEPARTMENT_OTHER): Payer: Self-pay | Admitting: Physical Therapy

## 2024-12-23 DIAGNOSIS — M6281 Muscle weakness (generalized): Secondary | ICD-10-CM

## 2024-12-23 DIAGNOSIS — R262 Difficulty in walking, not elsewhere classified: Secondary | ICD-10-CM

## 2024-12-23 DIAGNOSIS — M5459 Other low back pain: Secondary | ICD-10-CM

## 2024-12-23 NOTE — Progress Notes (Signed)
 " Cardiology Office Note:  .    Date:  12/24/2024  ID:  Mikayla [name], DOB 1966/06/21, MRN [id number] PCP: Mahlon Comer BRAVO, MD  [facility] Providers Cardiologist:  Shelda Bruckner, MD     History of Present Illness: .    Mikayla [name] is a 59 y.o. female with a hx of aortic atherosclerosis, ascending aortic aneurysm,  hyperlipidemia, prediabetes, GERD, and morbid obesity s/p gastric bypass surgery, who is seen for follow-up today. She was initially seen 07/10/2022 as a new consult at the request of Mahlon, Comer BRAVO, MD for the evaluation and management of family history of early CAD.   Family history: Mother died at 20 y/o from heart disease. Mother also had history of diabetes and hyperlipidemia. Her mother had several stents placed prior to CABG. Father passed from MI at 25 y/o. Maternal side has a history of heart disease in her aunt and uncle. She has several siblings who are currently in good health.  Testing history: CT calcium  scoring 06/2022 which revealed a coronary calcium  score of 0 and scattered aortic atherosclerosis. There was also mildly dilated caliber of ascending aorta, estimated at 41 mm. Subsequent chest/aorta CTA 07/04/2023 revealed ascending aortic dilatation measuring 4.2 cm. Calcium  score remained stable at 0.   Today: Overall doing well. Did have an episode a few weeks ago when her blood pressure dropped low. Got dizzy, almost passed out. Hadn't been sick, eating/drinking normally. Reviewed ER evaluation for this 12/05/24. Checks BP at home, has not had another systolic in the 70s like that time, has had rare systolics in the 80s but didn't feel as bad. Not on any antihypertensives.  ROS: Denies chest pain, shortness of breath at rest or with normal exertion. No PND, orthopnea, LE edema or unexpected weight gain. No full syncope or palpitations. ROS otherwise negative except as noted.   Studies Reviewed: SABRA         Physical Exam:    VS:  BP  124/72 (BP Location: Right Arm, Patient Position: Sitting, Cuff Size: Normal)   Pulse 80   Ht 5' 2 (1.575 m)   Wt 187 lb 9.6 oz (85.1 kg)   SpO2 96%   BMI 34.31 kg/m    Wt Readings from Last 3 Encounters:  12/24/24 187 lb 9.6 oz (85.1 kg)  12/08/24 190 lb 12.8 oz (86.5 kg)  12/05/24 186 lb (84.4 kg)    GEN: Well nourished, well developed in no acute distress HEENT: Normal, moist mucous membranes NECK: No JVD CARDIAC: regular rhythm, normal S1 and S2, no rubs or gallops. No murmur. VASCULAR: Radial and DP pulses 2+ bilaterally. No carotid bruits RESPIRATORY:  Clear to auscultation without rales, wheezing or rhonchi  ABDOMEN: Soft, non-tender, non-distended MUSCULOSKELETAL:  Ambulates independently SKIN: Warm and dry, no edema NEUROLOGIC:  Alert and oriented x 3. No focal neuro deficits noted. PSYCHIATRIC:  Normal affect   ASSESSMENT AND PLAN: .    Family history of early CV disease Aortic atherosclerosis CV risk factors: BMI 50-->34, hypercholesterolemia -calcium  score of 0, but there was scattered aortic atherosclerosis noted -taking rosuvastatin  5 mg every other day -lipid panel 12/08/2024 showed LDL 92 (was 60 previously). Discussed LDL goal <70.   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 Most recent echo 06/2024 measured 40 mm. Recheck echo 1 year, ordered. If it appears to enlarge on echo, then would follow up with CT after to get cross sectional imaging.   Cardiac risk counseling and prevention recommendations: -  recommend heart healthy/Mediterranean diet, with whole grains, fruits, vegetable, fish, lean meats, nuts, and olive oil. Limit salt. -recommend moderate walking, 3-5 times/week for 30-50 minutes each session. Aim for at least 150 minutes.week. Goal should be pace of 3 miles/hours, or walking 1.5 miles in 30 minutes -recommend avoidance of tobacco products. Avoid excess alcohol.  Dispo: Follow-up in 1 year, or sooner as needed.  Signed, Shelda Bruckner, MD   "
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
## 2024-12-24 ENCOUNTER — Encounter (HOSPITAL_BASED_OUTPATIENT_CLINIC_OR_DEPARTMENT_OTHER): Payer: Self-pay | Admitting: Cardiology

## 2024-12-24 ENCOUNTER — Other Ambulatory Visit (HOSPITAL_BASED_OUTPATIENT_CLINIC_OR_DEPARTMENT_OTHER): Payer: Self-pay

## 2024-12-24 ENCOUNTER — Ambulatory Visit (HOSPITAL_BASED_OUTPATIENT_CLINIC_OR_DEPARTMENT_OTHER): Payer: Self-pay | Admitting: Cardiology

## 2024-12-24 VITALS — BP 124/72 | HR 80 | Ht 62.0 in | Wt 187.6 lb

## 2024-12-24 DIAGNOSIS — I77810 Thoracic aortic ectasia: Secondary | ICD-10-CM

## 2024-12-24 DIAGNOSIS — Z7189 Other specified counseling: Secondary | ICD-10-CM | POA: Diagnosis not present

## 2024-12-24 DIAGNOSIS — Z8249 Family history of ischemic heart disease and other diseases of the circulatory system: Secondary | ICD-10-CM

## 2024-12-24 DIAGNOSIS — E7800 Pure hypercholesterolemia, unspecified: Secondary | ICD-10-CM

## 2024-12-24 DIAGNOSIS — I700 Atherosclerosis of aorta: Secondary | ICD-10-CM

## 2024-12-24 MED ORDER — ROSUVASTATIN CALCIUM 5 MG PO TABS
5.0000 mg | ORAL_TABLET | Freq: Every day | ORAL | 3 refills | Status: AC
  Filled 2024-12-24: qty 90, 90d supply, fill #0

## 2024-12-24 NOTE — Patient Instructions (Signed)
 Medication Instructions:  No changes *If you need a refill on your cardiac medications before your next appointment, please call your pharmacy*  Lab Work: none If you have labs (blood work) drawn today and your tests are completely normal, you will receive your results only by: MyChart Message (if you have MyChart) OR A paper copy in the mail If you have any lab test that is abnormal or we need to change your treatment, we will call you to review the results.  Testing/Procedures: SCHEDULE ECHO FOR AUGUST 2027 Your physician has requested that you have an echocardiogram. Echocardiography is a painless test that uses sound waves to create images of your heart. It provides your doctor with information about the size and shape of your heart and how well your hearts chambers and valves are working. This procedure takes approximately one hour. There are no restrictions for this procedure. Please do NOT wear cologne, perfume, aftershave, or lotions (deodorant is allowed). Please arrive 15 minutes prior to your appointment time.  Please note: We ask at that you not bring children with you during ultrasound (echo/ vascular) testing. Due to room size and safety concerns, children are not allowed in the ultrasound rooms during exams. Our front office staff cannot provide observation of children in our lobby area while testing is being conducted. An adult accompanying a patient to their appointment will only be allowed in the ultrasound room at the discretion of the ultrasound technician under special circumstances. We apologize for any inconvenience.   Follow-Up: At [facility], you and your health needs are our priority.  As part of our continuing mission to provide you with exceptional heart care, our providers are all part of one team.  This team includes your primary Cardiologist (physician) and Advanced Practice Providers or APPs (Physician Assistants and Nurse Practitioners) who all work  together to provide you with the care you need, when you need it.  Your next appointment:   12 month(s)  Provider:   Shelda Bruckner, MD, Rosaline Bane, NP, or Reche Finder, NP    We recommend signing up for the patient portal called MyChart.  Sign up information is provided on this After Visit Summary.  MyChart is used to connect with patients for Virtual Visits (Telemedicine).  Patients are able to view lab/test results, encounter notes, upcoming appointments, etc.  Non-urgent messages can be sent to your provider as well.   To learn more about what you can do with MyChart, go to [email or web address].

## 2024-12-28 ENCOUNTER — Ambulatory Visit (HOSPITAL_BASED_OUTPATIENT_CLINIC_OR_DEPARTMENT_OTHER): Payer: Self-pay

## 2025-01-06 ENCOUNTER — Ambulatory Visit (HOSPITAL_BASED_OUTPATIENT_CLINIC_OR_DEPARTMENT_OTHER): Payer: Self-pay | Admitting: Physical Therapy

## 2025-01-19 ENCOUNTER — Encounter (HOSPITAL_BASED_OUTPATIENT_CLINIC_OR_DEPARTMENT_OTHER): Payer: Self-pay | Admitting: Physical Therapy

## 2025-01-21 ENCOUNTER — Ambulatory Visit (HOSPITAL_BASED_OUTPATIENT_CLINIC_OR_DEPARTMENT_OTHER): Payer: Self-pay | Admitting: Physical Therapy

## 2025-01-26 ENCOUNTER — Encounter (HOSPITAL_BASED_OUTPATIENT_CLINIC_OR_DEPARTMENT_OTHER): Payer: Self-pay

## 2025-01-29 ENCOUNTER — Ambulatory Visit (HOSPITAL_BASED_OUTPATIENT_CLINIC_OR_DEPARTMENT_OTHER): Payer: Self-pay | Admitting: Physical Therapy

## 2025-02-09 ENCOUNTER — Ambulatory Visit (HOSPITAL_BASED_OUTPATIENT_CLINIC_OR_DEPARTMENT_OTHER): Payer: Self-pay | Admitting: Physical Therapy

## 2025-02-11 ENCOUNTER — Encounter (HOSPITAL_BASED_OUTPATIENT_CLINIC_OR_DEPARTMENT_OTHER): Payer: Self-pay | Admitting: Physical Therapy

## 2025-02-16 ENCOUNTER — Ambulatory Visit (HOSPITAL_BASED_OUTPATIENT_CLINIC_OR_DEPARTMENT_OTHER): Payer: Self-pay | Admitting: Physical Therapy

## 2025-02-23 ENCOUNTER — Encounter: Admitting: Family Medicine

## 2025-06-07 ENCOUNTER — Ambulatory Visit: Admitting: Family Medicine

## 2025-06-21 ENCOUNTER — Other Ambulatory Visit (HOSPITAL_BASED_OUTPATIENT_CLINIC_OR_DEPARTMENT_OTHER)
